# Patient Record
Sex: Female | Born: 1960
Health system: Southern US, Community
[De-identification: ages and names within clinical notes are randomized; demographics above are authoritative.]

## PROBLEM LIST (undated history)

## (undated) ENCOUNTER — Emergency Department (HOSPITAL_COMMUNITY): Admission: EM | Discharge status: Absent without leave | Payer: Medicare Other

## (undated) DIAGNOSIS — G4730 Sleep apnea, unspecified: Secondary | ICD-10-CM

## (undated) DIAGNOSIS — M1990 Unspecified osteoarthritis, unspecified site: Secondary | ICD-10-CM

## (undated) DIAGNOSIS — F419 Anxiety disorder, unspecified: Secondary | ICD-10-CM

## (undated) DIAGNOSIS — M549 Dorsalgia, unspecified: Secondary | ICD-10-CM

## (undated) DIAGNOSIS — I10 Essential (primary) hypertension: Secondary | ICD-10-CM

## (undated) DIAGNOSIS — F32A Depression, unspecified: Secondary | ICD-10-CM

## (undated) DIAGNOSIS — E785 Hyperlipidemia, unspecified: Secondary | ICD-10-CM

## (undated) DIAGNOSIS — Z955 Presence of coronary angioplasty implant and graft: Secondary | ICD-10-CM

## (undated) DIAGNOSIS — F329 Major depressive disorder, single episode, unspecified: Secondary | ICD-10-CM

## (undated) HISTORY — DX: Sleep apnea, unspecified: G47.30

## (undated) HISTORY — DX: Anxiety disorder, unspecified: F41.9

## (undated) HISTORY — DX: Depression, unspecified: F32.A

## (undated) HISTORY — DX: Hyperlipidemia, unspecified: E78.5

## (undated) HISTORY — DX: Unspecified osteoarthritis, unspecified site: M19.90

## (undated) HISTORY — PX: BACK SURGERY: SHX140

## (undated) HISTORY — DX: Essential (primary) hypertension: I10

## (undated) HISTORY — DX: Presence of coronary angioplasty implant and graft: Z95.5

## (undated) HISTORY — PX: KNEE SURGERY: SHX244

## (undated) HISTORY — PX: COLONOSCOPY: SHX174

## (undated) HISTORY — DX: Major depressive disorder, single episode, unspecified: F32.9

---

## 1985-08-06 HISTORY — PX: ECTOPIC PREGNANCY SURGERY: SHX613

## 1999-04-20 ENCOUNTER — Encounter: Payer: Self-pay | Admitting: Emergency Medicine

## 1999-04-20 ENCOUNTER — Emergency Department (HOSPITAL_COMMUNITY): Admission: EM | Admit: 1999-04-20 | Discharge: 1999-04-20 | Payer: Self-pay | Admitting: Emergency Medicine

## 1999-05-16 ENCOUNTER — Encounter: Payer: Self-pay | Admitting: Emergency Medicine

## 1999-05-16 ENCOUNTER — Emergency Department (HOSPITAL_COMMUNITY): Admission: EM | Admit: 1999-05-16 | Discharge: 1999-05-16 | Payer: Self-pay | Admitting: Emergency Medicine

## 1999-07-28 ENCOUNTER — Encounter: Payer: Self-pay | Admitting: Internal Medicine

## 1999-07-28 ENCOUNTER — Ambulatory Visit (HOSPITAL_COMMUNITY): Admission: RE | Admit: 1999-07-28 | Discharge: 1999-07-28 | Payer: Self-pay | Admitting: Internal Medicine

## 1999-08-11 ENCOUNTER — Encounter: Admission: RE | Admit: 1999-08-11 | Discharge: 1999-08-24 | Payer: Self-pay | Admitting: Internal Medicine

## 2000-04-14 ENCOUNTER — Emergency Department (HOSPITAL_COMMUNITY): Admission: EM | Admit: 2000-04-14 | Discharge: 2000-04-14 | Payer: Self-pay | Admitting: Emergency Medicine

## 2001-02-10 ENCOUNTER — Encounter: Payer: Self-pay | Admitting: Family Medicine

## 2001-02-10 ENCOUNTER — Ambulatory Visit (HOSPITAL_COMMUNITY): Admission: RE | Admit: 2001-02-10 | Discharge: 2001-02-10 | Payer: Self-pay | Admitting: Family Medicine

## 2001-11-19 ENCOUNTER — Emergency Department (HOSPITAL_COMMUNITY): Admission: EM | Admit: 2001-11-19 | Discharge: 2001-11-19 | Payer: Self-pay | Admitting: Emergency Medicine

## 2002-10-14 ENCOUNTER — Ambulatory Visit (HOSPITAL_COMMUNITY): Admission: RE | Admit: 2002-10-14 | Discharge: 2002-10-14 | Payer: Self-pay | Admitting: Family Medicine

## 2002-10-14 ENCOUNTER — Encounter: Payer: Self-pay | Admitting: Family Medicine

## 2003-01-30 ENCOUNTER — Emergency Department (HOSPITAL_COMMUNITY): Admission: EM | Admit: 2003-01-30 | Discharge: 2003-01-31 | Payer: Self-pay | Admitting: Emergency Medicine

## 2003-01-30 ENCOUNTER — Encounter: Payer: Self-pay | Admitting: *Deleted

## 2003-06-29 ENCOUNTER — Other Ambulatory Visit: Admission: RE | Admit: 2003-06-29 | Discharge: 2003-06-29 | Payer: Self-pay | Admitting: Obstetrics & Gynecology

## 2004-03-17 ENCOUNTER — Encounter: Admission: RE | Admit: 2004-03-17 | Discharge: 2004-06-15 | Payer: Self-pay | Admitting: Internal Medicine

## 2004-04-06 ENCOUNTER — Ambulatory Visit: Payer: Self-pay | Admitting: *Deleted

## 2004-04-24 ENCOUNTER — Ambulatory Visit: Payer: Self-pay | Admitting: *Deleted

## 2004-07-11 ENCOUNTER — Ambulatory Visit: Payer: Self-pay | Admitting: Internal Medicine

## 2004-07-25 ENCOUNTER — Ambulatory Visit: Payer: Self-pay | Admitting: Internal Medicine

## 2004-08-01 ENCOUNTER — Ambulatory Visit: Payer: Self-pay | Admitting: Internal Medicine

## 2004-08-23 ENCOUNTER — Ambulatory Visit: Payer: Self-pay | Admitting: Internal Medicine

## 2004-09-03 ENCOUNTER — Emergency Department (HOSPITAL_COMMUNITY): Admission: EM | Admit: 2004-09-03 | Discharge: 2004-09-03 | Payer: Self-pay | Admitting: Emergency Medicine

## 2004-09-27 ENCOUNTER — Ambulatory Visit: Payer: Self-pay | Admitting: Internal Medicine

## 2004-09-27 ENCOUNTER — Ambulatory Visit (HOSPITAL_COMMUNITY): Admission: RE | Admit: 2004-09-27 | Discharge: 2004-09-27 | Payer: Self-pay | Admitting: Internal Medicine

## 2004-09-29 ENCOUNTER — Ambulatory Visit: Payer: Self-pay | Admitting: Internal Medicine

## 2004-10-24 ENCOUNTER — Ambulatory Visit (HOSPITAL_COMMUNITY): Admission: RE | Admit: 2004-10-24 | Discharge: 2004-10-24 | Payer: Self-pay | Admitting: Neurosurgery

## 2004-10-25 ENCOUNTER — Ambulatory Visit: Payer: Self-pay | Admitting: Internal Medicine

## 2004-11-27 ENCOUNTER — Ambulatory Visit: Payer: Self-pay | Admitting: Internal Medicine

## 2004-12-12 ENCOUNTER — Ambulatory Visit (HOSPITAL_COMMUNITY): Admission: RE | Admit: 2004-12-12 | Discharge: 2004-12-13 | Payer: Self-pay | Admitting: Neurosurgery

## 2005-02-26 ENCOUNTER — Ambulatory Visit: Payer: Self-pay | Admitting: Internal Medicine

## 2005-03-15 ENCOUNTER — Encounter: Admission: RE | Admit: 2005-03-15 | Discharge: 2005-03-26 | Payer: Self-pay | Admitting: Neurosurgery

## 2005-03-16 ENCOUNTER — Ambulatory Visit (HOSPITAL_COMMUNITY): Admission: RE | Admit: 2005-03-16 | Discharge: 2005-03-16 | Payer: Self-pay | Admitting: Neurosurgery

## 2005-09-17 ENCOUNTER — Emergency Department (HOSPITAL_COMMUNITY): Admission: EM | Admit: 2005-09-17 | Discharge: 2005-09-17 | Payer: Self-pay | Admitting: Family Medicine

## 2005-11-12 ENCOUNTER — Emergency Department (HOSPITAL_COMMUNITY): Admission: EM | Admit: 2005-11-12 | Discharge: 2005-11-13 | Payer: Self-pay | Admitting: Emergency Medicine

## 2006-07-04 ENCOUNTER — Emergency Department (HOSPITAL_COMMUNITY): Admission: EM | Admit: 2006-07-04 | Discharge: 2006-07-05 | Payer: Self-pay | Admitting: Pediatrics

## 2006-09-30 ENCOUNTER — Ambulatory Visit (HOSPITAL_BASED_OUTPATIENT_CLINIC_OR_DEPARTMENT_OTHER): Admission: RE | Admit: 2006-09-30 | Discharge: 2006-09-30 | Payer: Self-pay | Admitting: Specialist

## 2007-01-21 ENCOUNTER — Emergency Department (HOSPITAL_COMMUNITY): Admission: EM | Admit: 2007-01-21 | Discharge: 2007-01-21 | Payer: Self-pay | Admitting: Emergency Medicine

## 2008-02-13 ENCOUNTER — Ambulatory Visit: Payer: Self-pay | Admitting: Internal Medicine

## 2008-02-13 LAB — CONVERTED CEMR LAB
ALT: 18 units/L (ref 0–35)
BUN: 9 mg/dL (ref 6–23)
Basophils Absolute: 0 10*3/uL (ref 0.0–0.1)
CO2: 25 meq/L (ref 19–32)
Creatinine, Ser: 0.86 mg/dL (ref 0.40–1.20)
Eosinophils Relative: 1 % (ref 0–5)
Ferritin: 42 ng/mL (ref 10–291)
Glucose, Bld: 91 mg/dL (ref 70–99)
HCT: 36.3 % (ref 36.0–46.0)
Hemoglobin: 11.2 g/dL — ABNORMAL LOW (ref 12.0–15.0)
Lymphocytes Relative: 41 % (ref 12–46)
Monocytes Absolute: 0.4 10*3/uL (ref 0.1–1.0)
Monocytes Relative: 7 % (ref 3–12)
RDW: 15.9 % — ABNORMAL HIGH (ref 11.5–15.5)
Saturation Ratios: 20 % (ref 20–55)
Total Bilirubin: 0.5 mg/dL (ref 0.3–1.2)

## 2008-02-22 ENCOUNTER — Emergency Department (HOSPITAL_COMMUNITY): Admission: EM | Admit: 2008-02-22 | Discharge: 2008-02-22 | Payer: Self-pay | Admitting: Emergency Medicine

## 2008-02-26 ENCOUNTER — Ambulatory Visit: Payer: Self-pay | Admitting: Internal Medicine

## 2008-03-04 ENCOUNTER — Ambulatory Visit: Payer: Self-pay | Admitting: Internal Medicine

## 2008-03-24 ENCOUNTER — Ambulatory Visit: Payer: Self-pay | Admitting: Internal Medicine

## 2008-03-25 ENCOUNTER — Emergency Department (HOSPITAL_COMMUNITY): Admission: EM | Admit: 2008-03-25 | Discharge: 2008-03-25 | Payer: Self-pay | Admitting: Emergency Medicine

## 2009-03-07 ENCOUNTER — Observation Stay (HOSPITAL_COMMUNITY): Admission: EM | Admit: 2009-03-07 | Discharge: 2009-03-08 | Payer: Self-pay | Admitting: Emergency Medicine

## 2009-03-07 ENCOUNTER — Ambulatory Visit: Payer: Self-pay | Admitting: Cardiovascular Disease

## 2009-03-07 ENCOUNTER — Encounter: Payer: Self-pay | Admitting: Cardiology

## 2009-03-08 ENCOUNTER — Encounter: Payer: Self-pay | Admitting: Cardiology

## 2009-03-14 ENCOUNTER — Encounter (INDEPENDENT_AMBULATORY_CARE_PROVIDER_SITE_OTHER): Payer: Self-pay | Admitting: *Deleted

## 2009-03-14 ENCOUNTER — Telehealth: Payer: Self-pay | Admitting: Cardiovascular Disease

## 2009-03-14 ENCOUNTER — Ambulatory Visit: Payer: Self-pay | Admitting: Cardiovascular Disease

## 2009-03-14 DIAGNOSIS — E876 Hypokalemia: Secondary | ICD-10-CM

## 2009-03-14 DIAGNOSIS — N289 Disorder of kidney and ureter, unspecified: Secondary | ICD-10-CM | POA: Insufficient documentation

## 2009-03-14 HISTORY — DX: Disorder of kidney and ureter, unspecified: N28.9

## 2009-03-23 LAB — CONVERTED CEMR LAB
CO2: 29 meq/L (ref 19–32)
Chloride: 110 meq/L (ref 96–112)
Potassium: 3.7 meq/L (ref 3.5–5.1)
Sodium: 145 meq/L (ref 135–145)

## 2009-04-20 ENCOUNTER — Emergency Department (HOSPITAL_COMMUNITY): Admission: EM | Admit: 2009-04-20 | Discharge: 2009-04-20 | Payer: Self-pay | Admitting: Family Medicine

## 2010-08-27 ENCOUNTER — Encounter: Payer: Self-pay | Admitting: Neurosurgery

## 2010-09-06 ENCOUNTER — Emergency Department (HOSPITAL_COMMUNITY)
Admission: EM | Admit: 2010-09-06 | Discharge: 2010-09-07 | Disposition: A | Discharge status: Home / Self Care | Payer: Self-pay | Attending: Emergency Medicine | Admitting: Emergency Medicine

## 2010-09-06 DIAGNOSIS — M25569 Pain in unspecified knee: Secondary | ICD-10-CM | POA: Insufficient documentation

## 2010-09-06 DIAGNOSIS — I10 Essential (primary) hypertension: Secondary | ICD-10-CM | POA: Insufficient documentation

## 2010-09-06 DIAGNOSIS — M5430 Sciatica, unspecified side: Secondary | ICD-10-CM | POA: Insufficient documentation

## 2010-09-06 DIAGNOSIS — Z9889 Other specified postprocedural states: Secondary | ICD-10-CM | POA: Insufficient documentation

## 2010-11-12 LAB — FOLATE: Folate: 20 ng/mL

## 2010-11-12 LAB — LIPID PANEL
Cholesterol: 164 mg/dL (ref 0–200)
HDL: 72 mg/dL (ref >39)
Total CHOL/HDL Ratio: 2.3 RATIO

## 2010-11-12 LAB — BASIC METABOLIC PANEL
CO2: 26 mEq/L (ref 19–32)
Chloride: 107 mEq/L (ref 96–112)
Glucose, Bld: 97 mg/dL (ref 70–99)
Potassium: 3.8 mEq/L (ref 3.5–5.1)
Sodium: 137 mEq/L (ref 135–145)

## 2010-11-12 LAB — CARDIAC PANEL(CRET KIN+CKTOT+MB+TROPI)
CK, MB: 2.5 ng/mL (ref 0.3–4.0)
Relative Index: 1.2 (ref 0.0–2.5)
Relative Index: 1.2 (ref 0.0–2.5)
Total CK: 205 U/L — ABNORMAL HIGH (ref 7–177)
Troponin I: 0.01 ng/mL (ref 0.00–0.06)

## 2010-11-12 LAB — DIFFERENTIAL
Basophils Absolute: 0 10*3/uL (ref 0.0–0.1)
Basophils Relative: 0 % (ref 0–1)
Eosinophils Relative: 1 % (ref 0–5)
Monocytes Absolute: 0.3 10*3/uL (ref 0.1–1.0)
Monocytes Relative: 6 % (ref 3–12)

## 2010-11-12 LAB — IRON AND TIBC
Saturation Ratios: 16 % — ABNORMAL LOW (ref 20–55)
TIBC: 276 ug/dL (ref 250–470)
UIBC: 231 ug/dL

## 2010-11-12 LAB — POCT CARDIAC MARKERS: CKMB, poc: 2.1 ng/mL (ref 1.0–8.0)

## 2010-11-12 LAB — CBC
HCT: 31.9 % — ABNORMAL LOW (ref 36.0–46.0)
Hemoglobin: 10.6 g/dL — ABNORMAL LOW (ref 12.0–15.0)
MCHC: 33.3 g/dL (ref 30.0–36.0)
RBC: 3.72 MIL/uL — ABNORMAL LOW (ref 3.87–5.11)
RDW: 15.4 % (ref 11.5–15.5)

## 2010-11-12 LAB — VITAMIN B12: Vitamin B-12: 557 pg/mL (ref 211–911)

## 2010-11-12 LAB — FERRITIN: Ferritin: 37 ng/mL (ref 10–291)

## 2010-11-12 LAB — CK TOTAL AND CKMB (NOT AT ARMC)
CK, MB: 5 ng/mL — ABNORMAL HIGH (ref 0.3–4.0)
Relative Index: 1.6 (ref 0.0–2.5)
Total CK: 311 U/L — ABNORMAL HIGH (ref 7–177)

## 2010-12-19 NOTE — H&P (Signed)
NAMENECHA, Cathy [name]              ACCOUNT NO.:  [id number]   MEDICAL RECORD NO.:  [id number]          PATIENT TYPE:  INP   LOCATION:  3705                         FACILITY:  [facility]   PHYSICIAN:  Madolyn Frieze. Jens Som, MD, FACCDATE OF BIRTH:  25-Feb-1961   DATE OF ADMISSION:  03/07/2009  DATE OF DISCHARGE:                              HISTORY & PHYSICAL   PRIMARY CARE PHYSICIAN:  At [facility].   PRIMARY CARDIOLOGIST:  None.   CHIEF COMPLAINT:  Chest pain.   HISTORY OF PRESENT ILLNESS:  Cathy [name] is a 50 year old female with  no history of coronary artery disease.  She was wakened by substernal  chest pain and left-sided chest pain at 5 a.m.  She states the pain was  a tightness and reached to 10/10.  It was worse with movement and deep  inspiration.  It radiated to her left shoulder.  She had some shortness  of breath with this but no nausea, vomiting or diaphoresis.  She had  some slight dizziness.  When her symptoms did not resolve, she came to  the ER at approximately 6:30.  In the emergency room, she received  aspirin and sublingual nitroglycerin.  The tightness resolved but she  continues to have sharp chest pain intermittently.  Currently, she is  resting comfortably.  Of note, she has had a few episodes of chest  tightness prior to the one that woke her today but they were less  severe.  She has no exertional symptoms and feels that her exertion  level is at least moderate in the course of her job.   PAST MEDICAL HISTORY:  1. Hypertension.  2. Family history of premature coronary artery disease.  3. Obesity.  4. History of anemia by labs but the patient is unaware.   SURGICAL HISTORY:  She is status post back surgery and knee surgery.   ALLERGIES:  No known drug allergies.   MEDICATIONS:  None.  OTC, rare.   Previous medications included hydrochlorothiazide and one other drug for  blood pressure control but she does discontinued these because of hair  loss.   SOCIAL HISTORY:  She lives in [place] alone and works at  [employer].  She quit smoking in her early 51s and denies alcohol or  drug abuse.   FAMILY HISTORY:  Her mother died at age 49 of an MI.  Her father died at  age 48 of a CVA but was already in a nursing home.  She has no coronary  artery disease known in her siblings.   REVIEW OF SYSTEMS:  Significant for occasional arthralgias.  She denies  any significant dyspnea on exertion but states she does have to stop and  rest occasionally.  She does not feel that this has changed dramatically  in the last 6 months to several years.  She denies fevers or chills.  She denies melena.  Of note, she states that she has not had a menstrual  period since 2003.  She denies reflux symptoms or abdominal pain.  Full  14-point review of systems is otherwise negative.   PHYSICAL EXAMINATION:  VITAL SIGNS:  Temperature is 98.0, blood pressure  151/81, pulse 54, respiratory rate 18, O2 saturation 100% on room air.  GENERAL:  She is a well-developed obese African American female in no  acute distress.  HEENT:  Normal.  NECK:  There is no lymphadenopathy, thyromegaly, bruit or JVD noted.  CVA:  Her heart is regular in rate and rhythm with an S1-S2 and no  significant murmur, rub or gallop is noted.  Distal pulses are intact in  all four extremities.  LUNGS:  Clear to auscultation bilaterally.  SKIN:  No rashes or lesions are noted.  ABDOMEN:  Soft and nontender with active bowel sounds.  EXTREMITIES:  There is no cyanosis, clubbing or edema noted.  MUSCULOSKELETAL:  There is no joint deformity or effusions and no spine  or CVA tenderness.  NEURO:  She is alert and oriented.  Cranial nerves II-XII grossly  intact.   Chest x-ray shows borderline cardiomegaly with increased bronchial  markings but no acute disease.   EKG sinus bradycardia rate 48 but no acute ischemic changes.   LABORATORY VALUES:  Hemoglobin 10.6, hematocrit 31.9, WBCs  4.5,  platelets 194.  Sodium 137, potassium 3.8, chloride 107, CO2 26, BUN 13,  creatinine 0.78, glucose 97.  Point of care markers show an MB of 2.1,  troponin 0.18, myoglobin 82.3, a D-dimer is normal.   IMPRESSION:  Cathy [name] was seen today by Dr. Jens Som.  She is a 50-  year-old female with a past medical history of hypertension who we were  asked to evaluate for chest pain.  She developed chest pain at  approximately 5 a.m. which woke her.  It is mostly on the left side and  increased with movements and moving her left upper extremity as well as  increased inspiration.  There were no associated symptoms and the  duration was about 3 hours total.  It was relieved with sublingual  nitroglycerin.  She has no history of exertional chest pain.  Her EKG  has no ST changes and is sinus bradycardia.  Her chest pain was  reproducible and is questionably musculoskeletal but the point of care  troponin is increased.  She will be admitted and we will cycle cardiac  enzymes.  If these are negative, a Myoview will be scheduled in a.m.  She needs an outpatient evaluation for normocytic anemia, but her  hemoglobin was 10.3 with a hematocrit of 31.9 in May 2006 prior to back  surgery.  Because of longstanding anemia, although her MCV is normal, we  will check an iron profile.      Cathy Demark, PA-C      Madolyn Frieze. Jens Som, MD, [facility]  Electronically Signed    RB/MEDQ  D:  03/07/2009  T:  03/07/2009  Job:  [id number]

## 2010-12-19 NOTE — Discharge Summary (Signed)
[name], [name]              ACCOUNT NO.:  [id number]   MEDICAL RECORD NO.:  [id number]          PATIENT TYPE:  OBV   LOCATION:  3705                         FACILITY:  [facility]   PHYSICIAN:  Cathy Pick. Eden Emms, MD, FACCDATE OF BIRTH:  [birth year]   DATE OF ADMISSION:  03/07/2009  DATE OF DISCHARGE:  03/08/2009                               DISCHARGE SUMMARY   PROCEDURES:  1. 2-D echocardiogram.  2. Adenosine Myoview.   PRIMARY FINAL DISCHARGE DIAGNOSIS:  Chest pain, cardiac enzymes negative  for myocardial infarction and Myoview negative for ischemia with a  preserved ejection fraction.   SECONDARY DIAGNOSES:  1. Hypertension.  2. Family history of premature coronary artery disease.  3. Obesity.  4. Anemia.  5. Status post back surgery and knee surgery.   TIME OF DISCHARGE:  34 minutes.   HOSPITAL COURSE:  Cathy [name] is a 50 year old female with known  previous history of coronary artery disease.  She had chest tightness  and came to the hospital where she was admitted for further evaluation.   Her cardiac enzymes were negative for MI.  She was off her blood  pressure medications because of hair loss, so different medications were  started.  She tolerated these well and her blood pressure decreased.  She was anemic with a hemoglobin of 10.6 and hematocrit of 31.9.  These  labs were similar to preop labs for back surgery she had remotely, but  the patient was unaware of this.  An iron profile was performed which  showed low normal iron at 45, TIBC 276, and 16% saturation.  Serum  folate and ferritin were within normal limits.  She is advised to follow  up with her primary care physician.   A lipid profile showed total cholesterol 164, triglycerides 48, HDL 72,  and LDL 82.  Cardiac enzymes were negative for MI.  An adenosine Myoview  was performed in the hospital which showed no scar or ischemia and an EF  of 58%.  A 2-D echocardiogram was ordered and preliminary results  say  that it was normal, but detailed results are not available at this time.   On March 08, 2009, Dr. Eden [name] evaluated the data and Cathy [name].  Her  chest pain had resolved.  He felt that she was stable for discharge with  p.r.n. outpatient followup.   DISCHARGE INSTRUCTIONS:  Her activity level is to be increased  gradually.  She is encouraged to stick to a low-sodium, heart-healthy  diet.  She was started on an ACE inhibitor and therefore needs a BMET in  a week.  She is to follow up with [facility] and call for an  appointment.   DISCHARGE MEDICATIONS:  Per the computerized discharge sheet.      Cathy Demark, PA-C      Cathy Pick. Eden Emms, MD, [facility]  Electronically Signed    RB/MEDQ  D:  03/08/2009  T:  03/09/2009  Job:  [id number]   cc:   Cathy [name]

## 2010-12-22 NOTE — Op Note (Signed)
[name], Cathy [name]              ACCOUNT NO.:  [id number]   MEDICAL RECORD NO.:  [id number]          PATIENT TYPE:  AMB   LOCATION:  NESC                         FACILITY:  [facility]   PHYSICIAN:  Jene Every, M.D.    DATE OF BIRTH:  [birth year]   DATE OF PROCEDURE:  09/30/2006  DATE OF DISCHARGE:                               OPERATIVE REPORT   PREOPERATIVE DIAGNOSES:  Posttraumatic chondromalacia of the  patellofemoral joint.   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   PROCEDURE:  Left knee arthroscopy, chondroplasty of the patella and of  the tibia plateau, shaving of the lateral meniscus.   ANESTHESIA:  General.   ASSISTANT:  None.   BRIEF HISTORY AND INDICATIONS:  A 50 year old sustained an injury to the  left knee. MRI indicating chondromalacia refractory to conservative  treatment with persistent patellofemoral pain despite injections. She  was indicated for arthroscopic debridement and chondroplasty. The risks  and benefits were discussed including bleeding, infection, no change in  symptoms, worsening symptoms, need for repeat debridements in the  future, anesthetic complications, etc.   TECHNIQUE:  The patient in supine position and after the induction of  adequate general anesthesia and 2 grams of Kefzol, the left lower  extremity was  prepped and draped in the usual sterile fashion. A  lateral parapatellar portal and superomedial parapatellar portal was  fashioned with a #11 blade, Ingress cannula atraumatically placed.  Irrigant was utilized to insufflate the joint. Under direct  visualization, a medial parapatellar portal was fashioned with a #11  blade after localization with an 18-gauge needle sparing the medial  meniscus. Noted immediately were some significant grade 3 changes of the  entire patella. There was normal patellofemoral tracking. I introduced a  4.2 Kuda shaver and utilized it to perform a chondroplasty of the entire  patella through the medial and lateral parapatellar portal. The lateral  compartment revealed grade 3 changes of the tibial plateau and a  chondroplasty was performed. There was some degenerative fraying of the  meniscus, this was shaved as well. The remainder of the meniscus however  was stable to propalpation. The femoral condyle was unremarkable. ACL  and PCL were unremarkable. The medial compartment was essentially  unremarkable. Normal meniscus. No evidence of tear. The chondral  surfaces showed some minor grade 2 changes. The gutters were  unremarkable. The knee was copiously lavaged, I reexamined the patella  again with the repeat chondroplasty. There was no loose cartilaginous  debris or any other chondral surface amendable to arthroscopy. Next, the  knee was copiously lavaged, all instrumentation was removed, portals  were closed with 4-0 nylon simple sutures. A 0.25% Marcaine with  epinephrine was infiltrated in the joint, the wound  was dressed sterilely, she was awoken without difficulty and transported  to the recovery room in satisfactory condition.   The patient tolerated the procedure well with no complications.      Jene Every, M.D.  Electronically Signed     JB/MEDQ  D:  09/30/2006  T:  09/30/2006  Job:  [id number]

## 2010-12-22 NOTE — Op Note (Signed)
Cathy [name], Cathy [name] NO.:  [id number]   MEDICAL RECORD NO.:  [id number]          PATIENT TYPE:  INP   LOCATION:  3172                         FACILITY:  [facility]   PHYSICIAN:  Clydene Fake, M.D.  DATE OF BIRTH:  1961/06/04   DATE OF PROCEDURE:  12/12/2004  DATE OF DISCHARGE:                                 OPERATIVE REPORT   PREOPERATIVE DIAGNOSIS:  Herniated nucleus pulposus, right L5-S1.   POSTOPERATIVE DIAGNOSIS:  Prior surgery with recurrent herniated nucleus  pulposus, right L5-S1.   PROCEDURE:  Redo right L5-S1 laminectomy and discectomy, microdissection  with the microscope.   SURGEON:  Clydene Fake, M.D.   ASSISTANT:  Payton Doughty, M.D.   ANESTHESIA:  General endotracheal anesthesia.   ESTIMATED BLOOD LOSS:  Minimal.   DRAINS:  None.   COMPLICATIONS:  None.   INDICATIONS FOR PROCEDURE:  The patient is a 50 year old woman who has had  back and right leg pain and numbness.  She did have a lumbar laminectomy in  1999 for right sided problems definitely at the 4-5 level, possibly at the 5-  1 level.  The patient has had a right L5 radiculopathy with weakness and  numbness ever since, but newer symptoms have been an S1 type radiculopathy  with pain and numbness.  MRI was done showing disc herniation right side L5-  S1 compressing the S1 root.  The patient is brought in for discectomy.   PROCEDURE IN DETAIL:  The patient was brought to the operating room, general  anesthesia was induced, the patient was placed in the prone position on the  Wilson frame with all pressure points padded.  The patient was prepped and  draped in the usual sterile fashion.  The site of incision was injected with  10 mL of 1% lidocaine with epinephrine.  An incision was made in the midline  lower lumbar spine at the same site of the prior incision.  This was taken  down to the fascia.  Hemostasis was obtained with Bovie cauterization.  The  fascia was incised over the  spinous processes of L5-S1 and subperiosteal  dissection was done up to the facets.  A marker was placed in the interspace  and x-ray was obtained showing this was the L5-S1 interspace.  A self-  retaining retractor was placed.  Microscope was brought in for  microdissection.  We were dissecting through scar through the entire opening  of a subperiosteal dissection.  The scar continued down medial to the facet,  we carefully dissected the scar off the lamina and facets and the S1 lamina.  A foraminotomy over the S1 root removed medial facet and removed a little  more of the L5 lamina using a high speed drill and then Kerrison punches.  We carefully dissected the scar and then removed some hypertrophic ligament  giving [place] some more room down to the pedicle of S1, dissecting up under the  S1 nerve root to the disc space.  I dissected to the scar over the disc  space and to the L5 vertebral body and then up to  the foramen.  During this  dissection we did loosen up the scar and the hypertrophic ligament which we  removed.  We then opened the disc space at 5-1 and incised the space with a  15 blade and did discectomy with pituitary rongeurs and curets.  We  continued to loosen the dura from the scar from the disc space as we  retracted the dura and nerve root medially and opened up the disc space  more.  There was some scar tissue over the dura and with careful dissection,  found similar fragments of disc that were pushed up into the dura.  I  continued with the discectomy and when we were finished, we had good  discectomy, good central decompression, and the L5 and S1 roots were  decompressed at this level.  The wound was irrigated with antibiotic  solution.  We had good hemostasis.  Again, the wound was irrigated with  antibiotic solution, the retractors were removed.  The fascia was closed  with 0 Vicryl interrupted sutures, the subcutaneous tissues were closed with  0, 2-0 and 3-0 Vicryl  sutures, and the skin was closed with Benzoin and  Steri-Strips.  Note, during all the dissection, discectomy, and dissection  of the scar, the microscope was used with microdissection technique.  A  dressing was placed.  She was placed back in the supine position, awakened  from anesthesia, and transferred to the recovery room in stable condition.      JRH/MEDQ  D:  12/12/2004  T:  12/12/2004  Job:  [id number]

## 2013-01-12 ENCOUNTER — Other Ambulatory Visit (HOSPITAL_COMMUNITY): Payer: Self-pay | Admitting: Family Medicine

## 2013-01-12 ENCOUNTER — Ambulatory Visit (HOSPITAL_COMMUNITY)
Admission: RE | Admit: 2013-01-12 | Discharge: 2013-01-12 | Disposition: A | Discharge status: Home / Self Care | Payer: Self-pay | Source: Ambulatory Visit | Attending: Family Medicine | Admitting: Family Medicine

## 2013-01-12 DIAGNOSIS — M545 Low back pain, unspecified: Secondary | ICD-10-CM | POA: Insufficient documentation

## 2013-01-12 DIAGNOSIS — R52 Pain, unspecified: Secondary | ICD-10-CM

## 2013-01-22 ENCOUNTER — Encounter (HOSPITAL_COMMUNITY): Payer: Self-pay | Admitting: *Deleted

## 2013-01-22 DIAGNOSIS — L299 Pruritus, unspecified: Secondary | ICD-10-CM | POA: Insufficient documentation

## 2013-01-22 DIAGNOSIS — L509 Urticaria, unspecified: Secondary | ICD-10-CM | POA: Insufficient documentation

## 2013-01-22 NOTE — ED Notes (Signed)
Pt states that she got a watermelon today outside off the street from a vendor for the first time at this vendor. Pt states that she ate the watermelon this morning and did not have a rash, then she ate a bigger piece a couple of hours ago and started having hives on her forearms an hour ago. Pt has had watermelon before but doesn't know if she had a reaction to this watermelon.

## 2013-01-23 ENCOUNTER — Emergency Department (HOSPITAL_COMMUNITY)
Admission: EM | Admit: 2013-01-23 | Discharge: 2013-01-23 | Disposition: A | Discharge status: Home / Self Care | Payer: Self-pay | Attending: Emergency Medicine | Admitting: Emergency Medicine

## 2013-01-23 DIAGNOSIS — L509 Urticaria, unspecified: Secondary | ICD-10-CM

## 2013-01-23 HISTORY — DX: Dorsalgia, unspecified: M54.9

## 2013-01-23 MED ORDER — FAMOTIDINE 20 MG PO TABS
20.0000 mg | ORAL_TABLET | Freq: Once | ORAL | Status: AC
  Administered 2013-01-23: 20 mg via ORAL
  Filled 2013-01-23: qty 1

## 2013-01-23 MED ORDER — DIPHENHYDRAMINE HCL 12.5 MG/5ML PO ELIX
50.0000 mg | ORAL_SOLUTION | Freq: Once | ORAL | Status: AC
  Administered 2013-01-23: 50 mg via ORAL
  Filled 2013-01-23: qty 20

## 2013-01-23 NOTE — ED Provider Notes (Signed)
Medical screening examination/treatment/procedure(s) were performed by non-physician practitioner and as supervising physician I was immediately available for consultation/collaboration.   Joya Gaskins, MD 01/23/13 [phone]

## 2013-01-23 NOTE — ED Provider Notes (Signed)
History     CSN: [id number]  Arrival date & time 01/22/13  2342   None     Chief Complaint  Patient presents with  . Rash    (Consider location/radiation/quality/duration/timing/severity/associated sxs/prior treatment) HPI History provided by pt.   Pt c/o severely pruritic and diffuse rash of upper extremities since last night.  Has not taken anything for sx. No associated sx.  No known allergies or new contacts.  She suspects that she is having a reaction to watermelon.  Past Medical History  Diagnosis Date  . Back pain     Past Surgical History  Procedure Laterality Date  . Back surgery    . Knee surgery      History reviewed. No pertinent family history.  History  Substance Use Topics  . Smoking status: Never Smoker   . Smokeless tobacco: Not on file  . Alcohol Use: Yes    OB History   Grav Para Term Preterm Abortions TAB SAB Ect Mult Living                  Review of Systems  All other systems reviewed and are negative.    Allergies  Review of patient's allergies indicates no known allergies.  Home Medications   Current Outpatient Rx  Name  Route  Sig  Dispense  Refill  . cyclobenzaprine (FLEXERIL) 10 MG tablet   Oral   Take 10 mg by mouth 3 (three) times daily as needed for muscle spasms.         . diphenhydrAMINE (BENADRYL) 2 % cream   Topical   Apply topically 3 (three) times daily as needed for itching.         . diphenhydrAMINE (SOMINEX) 25 MG tablet   Oral   Take 25 mg by mouth at bedtime as needed for itching or sleep.         . nabumetone (RELAFEN) 500 MG tablet   Oral   Take 500 mg by mouth 2 (two) times daily as needed for pain.         . traMADol (ULTRAM) 50 MG tablet   Oral   Take 50 mg by mouth every 6 (six) hours as needed for pain.           BP 108/69  Pulse 54  Temp(Src) 98.2 F (36.8 C) (Oral)  Resp 16  Ht 5\' 2"  (1.575 m)  Wt 236 lb (107.049 kg)  BMI 43.15 kg/m2  SpO2 99%  Physical Exam  Nursing  note and vitals reviewed. Constitutional: She is oriented to person, place, and time. She appears well-developed and well-nourished. No distress.  HENT:  Head: Normocephalic and atraumatic.  Mouth/Throat: Oropharynx is clear and moist and mucous membranes are normal. No posterior oropharyngeal edema.  No lip or tongue edema.  Eyes:  Normal appearance  Neck: Normal range of motion.  Cardiovascular: Normal rate and regular rhythm.   Pulmonary/Chest: Effort normal and breath sounds normal. No stridor. No respiratory distress.  Musculoskeletal: Normal range of motion.  Neurological: She is alert and oriented to person, place, and time.  Skin: Skin is warm and dry. No rash noted.  Diffuse hives and excoriations of upper extremities.   Psychiatric: She has a normal mood and affect. Her behavior is normal.    ED Course  Procedures (including critical care time)  Labs Reviewed - No data to display No results found.   1. Hives       MDM  [phone] F presents w/  hives.  No sign of impending airway compromise.  Pt had significant relief w/ po benadryl and pepcid.  Recommended that she continue these medications.  Return precautions discussed.         Otilio Miu, PA-C 01/23/13 [phone]

## 2013-04-06 ENCOUNTER — Emergency Department (HOSPITAL_COMMUNITY)
Admission: EM | Admit: 2013-04-06 | Discharge: 2013-04-06 | Disposition: A | Discharge status: Home / Self Care | Payer: Self-pay | Attending: Emergency Medicine | Admitting: Emergency Medicine

## 2013-04-06 ENCOUNTER — Emergency Department (HOSPITAL_COMMUNITY): Payer: Self-pay

## 2013-04-06 ENCOUNTER — Encounter (HOSPITAL_COMMUNITY): Payer: Self-pay | Admitting: Emergency Medicine

## 2013-04-06 DIAGNOSIS — M7662 Achilles tendinitis, left leg: Secondary | ICD-10-CM

## 2013-04-06 DIAGNOSIS — Z8739 Personal history of other diseases of the musculoskeletal system and connective tissue: Secondary | ICD-10-CM | POA: Insufficient documentation

## 2013-04-06 DIAGNOSIS — M7660 Achilles tendinitis, unspecified leg: Secondary | ICD-10-CM | POA: Insufficient documentation

## 2013-04-06 DIAGNOSIS — Z87891 Personal history of nicotine dependence: Secondary | ICD-10-CM | POA: Insufficient documentation

## 2013-04-06 MED ORDER — NAPROXEN 500 MG PO TABS: 500.0000 mg | ORAL_TABLET | Freq: Two times a day (BID) | ORAL | Status: DC

## 2013-04-06 NOTE — ED Provider Notes (Signed)
CSN: [id number]     Arrival date & time 04/06/13  1034 History   First MD Initiated Contact with Patient 04/06/13 1047     Chief Complaint  Patient presents with  . Ankle Pain   (Consider location/radiation/quality/duration/timing/severity/associated sxs/prior Treatment) The history is provided by the patient. No language interpreter was used.  Cathy [name] is a 52 year old female with past medical history of chronic back pain presenting to the emergency department with left ankle pain that started approximately 3 days ago. Patient reported that the discomfort is very to the posterior aspect of her left ankle, described as a throbbing sensation with a shooting pain when she takes off with walking. Patient reported that the pain radiates up the posterior aspect of the left leg when walking. Patient reported that she has been propping the left ankle up been taking over-the-counter medications with -6 facet and relief of pain. Patient reported that she has not had any type of injuries or falls. Denied numbness, tingling, calf pain, swelling, changes to colors, loss of sensation. PCP Dr. Venetia Night  Past Medical History  Diagnosis Date  . Back pain    Past Surgical History  Procedure Laterality Date  . Back surgery    . Knee surgery     No family history on file. History  Substance Use Topics  . Smoking status: Former Games developer  . Smokeless tobacco: Not on file  . Alcohol Use: Yes   OB History   Grav Para Term Preterm Abortions TAB SAB Ect Mult Living                 Review of Systems  Respiratory: Negative for shortness of breath.   Cardiovascular: Negative for chest pain.  Musculoskeletal: Positive for arthralgias.  Neurological: Negative for weakness and numbness.  All other systems reviewed and are negative.    Allergies  Review of patient's allergies indicates no known allergies.  Home Medications   Current Outpatient Rx  Name  Route  Sig  Dispense  Refill  . cyclobenzaprine  (FLEXERIL) 10 MG tablet   Oral   Take 10 mg by mouth 2 (two) times daily as needed for muscle spasms.          . methocarbamol (ROBAXIN) 500 MG tablet   Oral   Take 500 mg by mouth 4 (four) times daily.         . traMADol (ULTRAM) 50 MG tablet   Oral   Take 50 mg by mouth every 6 (six) hours as needed for pain.         . nabumetone (RELAFEN) 500 MG tablet   Oral   Take 250-500 mg by mouth 2 (two) times daily as needed for pain.          . naproxen (NAPROSYN) 500 MG tablet   Oral   Take 1 tablet (500 mg total) by mouth 2 (two) times daily.   30 tablet   0    BP 138/72  Pulse 82  Temp(Src) 98.4 F (36.9 C) (Oral)  Resp 18  Ht 5\' 5"  (1.651 m)  Wt 250 lb (113.399 kg)  BMI 41.6 kg/m2  SpO2 99% Physical Exam  Nursing note and vitals reviewed. Constitutional: She is oriented to person, place, and time. She appears well-developed and well-nourished. No distress.  HENT:  Head: Normocephalic and atraumatic.  Cardiovascular: Normal rate, regular rhythm and normal heart sounds.  Exam reveals no friction rub.   No murmur heard. Pulses:      Radial  pulses are 2+ on the right side, and 2+ on the left side.       Dorsalis pedis pulses are 2+ on the right side, and 2+ on the left side.  Negative ankle and leg swelling Negative pitting edema Negative calf pain  Pulmonary/Chest: Effort normal and breath sounds normal. No respiratory distress. She has no wheezes. She has no rales.  Musculoskeletal: Normal range of motion. She exhibits tenderness.       Legs: Negative swelling, erythema, inflammation, ecchymosis, deformities noted to the left foot and ankle. Full range of motion to the left ankle-see flexion, plantar flexion, inversion, eversion. Patient able to flex and extend digits without discomfort noted to the left foot. Patient able to apply weight to the left foot without discomfort noted. Discomfort upon palpation to the posterior aspect of the left ankle. Negative  Thompson sign.  Neurological: She is alert and oriented to person, place, and time. She exhibits normal muscle tone. Coordination normal.  Strength 5+/5+ with resistance to bilateral lower extremities, equal distribution Sensation intact to lower extremities bilaterally with differentiation to sharp and dull touch Mild limp noted when applying pressure to the left foot, discomfort noted when take off of the left foot when walking  Skin: Skin is warm and dry. No rash noted. She is not diaphoretic. No erythema.  Psychiatric: She has a normal mood and affect. Her behavior is normal. Thought content normal.    ED Course  Procedures (including critical care time) Labs Review Labs Reviewed - No data to display Imaging Review Dg Ankle Complete Left  04/06/2013   *RADIOLOGY REPORT*  Clinical Data: Left ankle pain.  LEFT ANKLE COMPLETE - 3+ VIEW  Comparison: None.  Findings: No acute bony or joint abnormality is identified.  No tibiotalar joint effusion.  Small calcaneal spur is noted.  IMPRESSION:  1.  No acute finding. 2.  Small calcaneal spur.   Original Report Authenticated By: Holley Dexter, M.D.    MDM   1. Achilles tendinitis, left     Patient presenting to the emergency department with posterior aspect left ankle discomfort that started approximately 3 days ago. Denied injury, fall. Alert and oriented. Negative deformities or erythema, inflammation noted to the left ankle. Full range of motion to the left ankle and digits of left foot. Discomfort noted upon palpation to the posterior aspect of the left ankle, Achilles tendon. Sensation intact, strength intact. Negative neurological deficits noted. Pulses palpable. Imaging of left ankle negative findings noted, small calcaneal spur identified. Suspicion to be Achilles tendinitis in nature. Patient reported that she wears slippers most the time and does not have proper comfort in her shoes. Patient placed in left ankle brace for comfort.  Discharge patient with small dose of anti-inflammatories. Discussed with patient proper shoe wear for comfort. Referred patient to orthopedics for followup. Discussed with patient to continue to monitor symptoms and if symptoms are to worsen or change to report back to emergency department-to return structures given. Patient be to plan of care, understood, all questions answered.    Raymon Mutton, PA-C 04/08/13 [phone]

## 2013-04-06 NOTE — ED Notes (Signed)
Patient complains of L ankle pain.  Patient states she has not had any injury that she is aware of.   Patient states no real swelling.   Patient advised has not been taking any pain medication for her ankle at home, however, took medicine for "chronic back problems" while she was in triage.

## 2013-04-08 NOTE — ED Provider Notes (Signed)
Medical screening examination/treatment/procedure(s) were performed by non-physician practitioner and as supervising physician I was immediately available for consultation/collaboration.   Enid Skeens, MD 04/08/13 [phone]

## 2013-09-14 ENCOUNTER — Encounter (HOSPITAL_COMMUNITY): Payer: Self-pay | Admitting: Emergency Medicine

## 2013-09-14 DIAGNOSIS — N644 Mastodynia: Secondary | ICD-10-CM | POA: Insufficient documentation

## 2013-09-14 DIAGNOSIS — M549 Dorsalgia, unspecified: Secondary | ICD-10-CM | POA: Insufficient documentation

## 2013-09-14 DIAGNOSIS — N6459 Other signs and symptoms in breast: Secondary | ICD-10-CM | POA: Insufficient documentation

## 2013-09-14 DIAGNOSIS — Z87891 Personal history of nicotine dependence: Secondary | ICD-10-CM | POA: Insufficient documentation

## 2013-09-14 DIAGNOSIS — Z79899 Other long term (current) drug therapy: Secondary | ICD-10-CM | POA: Insufficient documentation

## 2013-09-14 NOTE — ED Notes (Signed)
Pt states she has been having some clear discharge from her left breast x 2 weeks.

## 2013-09-15 ENCOUNTER — Emergency Department (HOSPITAL_COMMUNITY)
Admission: EM | Admit: 2013-09-15 | Discharge: 2013-09-15 | Disposition: A | Discharge status: Home / Self Care | Payer: Self-pay | Attending: Emergency Medicine | Admitting: Emergency Medicine

## 2013-09-15 DIAGNOSIS — N6452 Nipple discharge: Secondary | ICD-10-CM

## 2013-09-15 NOTE — Discharge Instructions (Signed)
Recommend followup with the breast clinic as an outpatient; call for appointment. Also recommend followup with women's outpatient clinic.  Breast Self-Awareness Practicing breast self-awareness may pick up problems early, prevent significant medical complications, and possibly save your life. By practicing breast self-awareness, you can become familiar with how your breasts look and feel and if your breasts are changing. This allows you to notice changes early. It can also offer you some reassurance that your breast health is good. One way to learn what is normal for your breasts and whether your breasts are changing is to do a breast self-exam. If you find a lump or something that was not present in the past, it is best to contact your caregiver right away. Other findings that should be evaluated by your caregiver include nipple discharge, especially if it is bloody; skin changes or reddening; areas where the skin seems to be pulled in (retracted); or new lumps and bumps. Breast pain is seldom associated with cancer (malignancy), but should also be evaluated by a caregiver. HOW TO PERFORM A BREAST SELF-EXAM The best time to examine your breasts is 5 7 days after your menstrual period is over. During menstruation, the breasts are lumpier, and it may be more difficult to pick up changes. If you do not menstruate, have reached menopause, or had your uterus removed (hysterectomy), you should examine your breasts at regular intervals, such as monthly. If you are breastfeeding, examine your breasts after a feeding or after using a breast pump. Breast implants do not decrease the risk for lumps or tumors, so continue to perform breast self-exams as recommended. Talk to your caregiver about how to determine the difference between the implant and breast tissue. Also, talk about the amount of pressure you should use during the exam. Over time, you will become more familiar with the variations of your breasts and more  comfortable with the exam. A breast self-exam requires you to remove all your clothes above the waist. 1. Look at your breasts and nipples. Stand in front of a mirror in a room with good lighting. With your hands on your hips, push your hands firmly downward. Look for a difference in shape, contour, and size from one breast to the other (asymmetry). Asymmetry includes puckers, dips, or bumps. Also, look for skin changes, such as reddened or scaly areas on the breasts. Look for nipple changes, such as discharge, dimpling, repositioning, or redness. 2. Carefully feel your breasts. This is best done either in the shower or tub while using soapy water or when flat on your back. Place the arm (on the side of the breast you are examining) above your head. Use the pads (not the fingertips) of your three middle fingers on your opposite hand to feel your breasts. Start in the underarm area and use  inch (2 cm) overlapping circles to feel your breast. Use 3 different levels of pressure (light, medium, and firm pressure) at each circle before moving to the next circle. The light pressure is needed to feel the tissue closest to the skin. The medium pressure will help to feel breast tissue a little deeper, while the firm pressure is needed to feel the tissue close to the ribs. Continue the overlapping circles, moving downward over the breast until you feel your ribs below your breast. Then, move one finger-width towards the center of the body. Continue to use the  inch (2 cm) overlapping circles to feel your breast as you move slowly up toward the collar bone (  clavicle) near the base of the neck. Continue the up and down exam using all 3 pressures until you reach the middle of the chest. Do this with each breast, carefully feeling for lumps or changes. 3.  Keep a written record with breast changes or normal findings for each breast. By writing this information down, you do not need to depend only on memory for size,  tenderness, or location. Write down where you are in your menstrual cycle, if you are still menstruating. Breast tissue can have some lumps or thick tissue. However, see your caregiver if you find anything that concerns you.  SEEK MEDICAL CARE IF:  You see a change in shape, contour, or size of your breasts or nipples.   You see skin changes, such as reddened or scaly areas on the breasts or nipples.   You have an unusual discharge from your nipples.   You feel a new lump or unusually thick areas.  Document Released: 07/23/2005 Document Revised: 07/09/2012 Document Reviewed: 11/07/2011 [facility] Patient Information 2014 [place].

## 2013-09-15 NOTE — ED Notes (Addendum)
Pt presents with leakage of left breast; mild pain; pt states leakage started about 3 months ago; has not seen PCP. Discharge appears to be clear with serum texture.

## 2013-09-15 NOTE — ED Provider Notes (Signed)
Medical screening examination/treatment/procedure(s) were performed by non-physician practitioner and as supervising physician I was immediately available for consultation/collaboration.    Teressa Lower, MD 09/15/13 [phone]

## 2013-09-15 NOTE — ED Provider Notes (Signed)
CSN: [id number]     Arrival date & time 09/14/13  1923 History   First MD Initiated Contact with Patient 09/15/13 [phone]     Chief Complaint  Patient presents with  . Breast Discharge     (Consider location/radiation/quality/duration/timing/severity/associated sxs/prior Treatment) HPI Comments: 53 year old female presents for discharge from her left nipple x2 months. Patient states that drainage has been intermittent, but more frequent over the last 2 weeks. She states that drainage is clear in nature. She denies any modifying factors of her symptoms. She denies associated fever, associated breast redness, heat to touch, or tenderness, shortness of breath, cough, and nausea or vomiting. Patient has not followed up with her primary care provider regarding this complaint. She denies ever having had a mammogram.  The history is provided by the patient. No language interpreter was used.    Past Medical History  Diagnosis Date  . Back pain    Past Surgical History  Procedure Laterality Date  . Back surgery    . Knee surgery     No family history on file. History  Substance Use Topics  . Smoking status: Former Research scientist (life sciences)  . Smokeless tobacco: Not on file  . Alcohol Use: No   OB History   Grav Para Term Preterm Abortions TAB SAB Ect Mult Living                 Review of Systems  Constitutional: Negative for fever.  Respiratory: Negative for shortness of breath.   Gastrointestinal: Negative for vomiting and diarrhea.  Skin: Negative for color change and rash.       +nipple discharge L breast  All other systems reviewed and are negative.    Allergies  Review of patient's allergies indicates no known allergies.  Home Medications   Current Outpatient Rx  Name  Route  Sig  Dispense  Refill  . nabumetone (RELAFEN) 500 MG tablet   Oral   Take 250-500 mg by mouth 2 (two) times daily as needed for pain.          [name] neomycin-bacitracin-polymyxin (NEOSPORIN) OINT   Topical   Apply  1 application topically as needed for irritation.         [name] OVER THE COUNTER MEDICATION   Oral   Take 1 tablet by mouth daily. "joint health"         . traMADol (ULTRAM) 50 MG tablet   Oral   Take 50 mg by mouth every 6 (six) hours as needed for pain.          BP 168/92  Pulse 78  Temp(Src) 98.2 F (36.8 C) (Oral)  Resp 20  Wt 269 lb 3.2 oz (122.108 kg)  SpO2 98%  Physical Exam  Nursing note and vitals reviewed. Constitutional: She is oriented to person, place, and time. She appears well-developed and well-nourished. No distress.  HENT:  Head: Normocephalic and atraumatic.  Eyes: Conjunctivae and EOM are normal. No scleral icterus.  Neck: Normal range of motion. Neck supple.  Cardiovascular: Normal rate, regular rhythm and normal heart sounds.   Pulmonary/Chest: Effort normal and breath sounds normal. No respiratory distress. She has no wheezes. She has no rales. Right breast exhibits no inverted nipple, no mass, no nipple discharge, no skin change and no tenderness. Left breast exhibits nipple discharge (Clear serous; scant). Left breast exhibits no inverted nipple, no mass, no skin change and no tenderness.  No erythema, heat to touch, or red linear streaking appreciated to left breast. No purulent  drainage from nipple. No tenderness to palpation or obvious masses on breast examination.  Abdominal: Soft. There is no tenderness. There is no rebound and no guarding.  Musculoskeletal: Normal range of motion.  Neurological: She is alert and oriented to person, place, and time.  Skin: Skin is warm and dry. No rash noted. She is not diaphoretic. No erythema. No pallor.  Psychiatric: She has a normal mood and affect. Her behavior is normal.    ED Course  Procedures (including critical care time) Labs Review Labs Reviewed - No data to display Imaging Review No results found.  EKG Interpretation   None       MDM   Final diagnoses:  Breast discharge   Uncomplicated  scant serous drainage from left nipple. Patient states the symptoms have been ongoing x2 months. Patient as well and nontoxic appearing, hemodynamically stable, and afebrile. No evidence of mastoiditis, cellulitis, or abscess. No tenderness appreciated on physical exam. Patient stable for discharge today with instruction to followup with the breast clinic as well as an OB/GYN. Return precautions provided and patient agreeable to plan with no unaddressed concerns.   Filed Vitals:   09/14/13 1957 09/15/13 0056  BP: 160/89 168/92  Pulse: 77 78  Temp: 98.2 F (36.8 C) 98.2 F (36.8 C)  TempSrc: Oral Oral  Resp: 18 20  Weight: 269 lb 3.2 oz (122.108 kg)   SpO2: 98% 98%       Antonietta Breach, PA-C 09/15/13 [phone]

## 2013-09-16 ENCOUNTER — Emergency Department (HOSPITAL_COMMUNITY)
Admission: EM | Admit: 2013-09-16 | Discharge: 2013-09-17 | Disposition: A | Discharge status: Home / Self Care | Payer: Self-pay | Attending: Emergency Medicine | Admitting: Emergency Medicine

## 2013-09-16 ENCOUNTER — Emergency Department (HOSPITAL_COMMUNITY): Payer: Self-pay

## 2013-09-16 ENCOUNTER — Encounter (HOSPITAL_COMMUNITY): Payer: Self-pay | Admitting: Emergency Medicine

## 2013-09-16 DIAGNOSIS — R569 Unspecified convulsions: Secondary | ICD-10-CM

## 2013-09-16 DIAGNOSIS — F29 Unspecified psychosis not due to a substance or known physiological condition: Secondary | ICD-10-CM | POA: Insufficient documentation

## 2013-09-16 DIAGNOSIS — Z79899 Other long term (current) drug therapy: Secondary | ICD-10-CM | POA: Insufficient documentation

## 2013-09-16 DIAGNOSIS — R209 Unspecified disturbances of skin sensation: Secondary | ICD-10-CM

## 2013-09-16 DIAGNOSIS — R519 Headache, unspecified: Secondary | ICD-10-CM

## 2013-09-16 DIAGNOSIS — R51 Headache: Secondary | ICD-10-CM

## 2013-09-16 DIAGNOSIS — Z87891 Personal history of nicotine dependence: Secondary | ICD-10-CM | POA: Insufficient documentation

## 2013-09-16 DIAGNOSIS — G43909 Migraine, unspecified, not intractable, without status migrainosus: Secondary | ICD-10-CM | POA: Insufficient documentation

## 2013-09-16 LAB — POCT I-STAT, CHEM 8
BUN: 12 mg/dL (ref 6–23)
CREATININE: 1 mg/dL (ref 0.50–1.10)
Calcium, Ion: 1.3 mmol/L — ABNORMAL HIGH (ref 1.12–1.23)
Chloride: 103 mEq/L (ref 96–112)
Glucose, Bld: 81 mg/dL (ref 70–99)
HCT: 40 % (ref 36.0–46.0)
Hemoglobin: 13.6 g/dL (ref 12.0–15.0)
Potassium: 4.1 mEq/L (ref 3.7–5.3)
Sodium: 141 mEq/L (ref 137–147)
TCO2: 24 mmol/L (ref 0–100)

## 2013-09-16 LAB — DIFFERENTIAL
BASOS PCT: 0 % (ref 0–1)
Basophils Absolute: 0 10*3/uL (ref 0.0–0.1)
EOS ABS: 0.1 10*3/uL (ref 0.0–0.7)
Eosinophils Relative: 1 % (ref 0–5)
LYMPHS ABS: 3.1 10*3/uL (ref 0.7–4.0)
Lymphocytes Relative: 41 % (ref 12–46)
MONOS PCT: 7 % (ref 3–12)
Monocytes Absolute: 0.5 10*3/uL (ref 0.1–1.0)
NEUTROS ABS: 3.9 10*3/uL (ref 1.7–7.7)
NEUTROS PCT: 52 % (ref 43–77)

## 2013-09-16 LAB — COMPREHENSIVE METABOLIC PANEL
ALBUMIN: 4.2 g/dL (ref 3.5–5.2)
ALK PHOS: 81 U/L (ref 39–117)
ALT: 22 U/L (ref 0–35)
AST: 22 U/L (ref 0–37)
BILIRUBIN TOTAL: 0.3 mg/dL (ref 0.3–1.2)
BUN: 13 mg/dL (ref 6–23)
CHLORIDE: 103 meq/L (ref 96–112)
CO2: 23 mEq/L (ref 19–32)
Calcium: 9.9 mg/dL (ref 8.4–10.5)
Creatinine, Ser: 0.9 mg/dL (ref 0.50–1.10)
GFR calc Af Amer: 85 mL/min — ABNORMAL LOW (ref >90)
GFR calc non Af Amer: 73 mL/min — ABNORMAL LOW (ref >90)
GLUCOSE: 82 mg/dL (ref 70–99)
POTASSIUM: 4.3 meq/L (ref 3.7–5.3)
SODIUM: 141 meq/L (ref 137–147)
TOTAL PROTEIN: 8.4 g/dL — AB (ref 6.0–8.3)

## 2013-09-16 LAB — CBC
HCT: 38 % (ref 36.0–46.0)
HEMOGLOBIN: 12.2 g/dL (ref 12.0–15.0)
MCH: 27.6 pg (ref 26.0–34.0)
MCHC: 32.1 g/dL (ref 30.0–36.0)
MCV: 86 fL (ref 78.0–100.0)
PLATELETS: 242 10*3/uL (ref 150–400)
RBC: 4.42 MIL/uL (ref 3.87–5.11)
RDW: 15.2 % (ref 11.5–15.5)
WBC: 7.5 10*3/uL (ref 4.0–10.5)

## 2013-09-16 LAB — URINALYSIS, ROUTINE W REFLEX MICROSCOPIC
BILIRUBIN URINE: NEGATIVE
Glucose, UA: NEGATIVE mg/dL
HGB URINE DIPSTICK: NEGATIVE
Ketones, ur: NEGATIVE mg/dL
Leukocytes, UA: NEGATIVE
Nitrite: NEGATIVE
Protein, ur: NEGATIVE mg/dL
SPECIFIC GRAVITY, URINE: 1.007 (ref 1.005–1.030)
UROBILINOGEN UA: 0.2 mg/dL (ref 0.0–1.0)
pH: 6 (ref 5.0–8.0)

## 2013-09-16 LAB — RAPID URINE DRUG SCREEN, HOSP PERFORMED
AMPHETAMINES: NOT DETECTED
Barbiturates: NOT DETECTED
Benzodiazepines: NOT DETECTED
COCAINE: NOT DETECTED
Opiates: NOT DETECTED
TETRAHYDROCANNABINOL: NOT DETECTED

## 2013-09-16 LAB — APTT: APTT: 28 s (ref 24–37)

## 2013-09-16 LAB — POCT I-STAT TROPONIN I: TROPONIN I, POC: 0.01 ng/mL (ref 0.00–0.08)

## 2013-09-16 LAB — GLUCOSE, CAPILLARY: Glucose-Capillary: 90 mg/dL (ref 70–99)

## 2013-09-16 LAB — PROTIME-INR
INR: 1.02 (ref 0.00–1.49)
PROTHROMBIN TIME: 13.2 s (ref 11.6–15.2)

## 2013-09-16 LAB — TROPONIN I

## 2013-09-16 MED ORDER — SODIUM CHLORIDE 0.9 % IV BOLUS (SEPSIS)
1000.0000 mL | Freq: Once | INTRAVENOUS | Status: AC
  Administered 2013-09-16: 1000 mL via INTRAVENOUS

## 2013-09-16 NOTE — ED Notes (Signed)
I asked the patient if she needed to pee so we could get a sample and she said "that boy just got some."  Patient also remembered that she has an appointment to see a doctor because her left breast has pus and fluid coming out of it.  She asked me if I could make sure it wasn't leaking.  So, she does remember appointments and her "left breast" that is leaking.

## 2013-09-16 NOTE — ED Provider Notes (Signed)
Date: 09/16/2013  Rate: 71  Rhythm: normal sinus rhythm  QRS Axis: normal  Intervals: normal  ST/T Wave abnormalities: nonspecific ST/T changes  Conduction Disutrbances:none  Narrative Interpretation:   Old EKG Reviewed: none available    Ezequiel Essex, MD 09/16/13 2002

## 2013-09-16 NOTE — ED Notes (Signed)
Family at bedside. 

## 2013-09-16 NOTE — ED Provider Notes (Signed)
CSN: [id number]     Arrival date & time 09/16/13  1811 History   First MD Initiated Contact with Patient 09/16/13 1901     Chief Complaint  Patient presents with  . Altered Mental Status     (Consider location/radiation/quality/duration/timing/severity/associated sxs/prior Treatment) Patient is a 53 y.o. female presenting with Acute Neurological Problem. The history is provided by the patient.  Cerebrovascular Accident This is a new problem. The current episode started today. The problem occurs constantly. The problem has been unchanged. Associated symptoms include headaches and weakness (right sided). Pertinent negatives include no abdominal pain, arthralgias, chest pain, chills, congestion, fatigue, fever, myalgias, nausea, neck pain, vertigo, visual change or vomiting. Nothing aggravates the symptoms. She has tried nothing for the symptoms. The treatment provided no relief.    53 year old female with the chief complaints of headache and right-sided weakness. Bystander noticed this around 5 this afternoon. EMS was called and she was taken here. Patient complaining of allover headache. Patient states that this could feel like her to buckle migraines that she's had in the past. Patient denies any prior episodes of numbness tingling associated with her headaches. Patient arrived as a code stroke. Taken urgently to scanner. Past Medical History  Diagnosis Date  . Back pain    Past Surgical History  Procedure Laterality Date  . Back surgery    . Knee surgery     No family history on file. History  Substance Use Topics  . Smoking status: Former Research scientist (life sciences)  . Smokeless tobacco: Not on file  . Alcohol Use: No   OB History   Grav Para Term Preterm Abortions TAB SAB Ect Mult Living                 Review of Systems  Constitutional: Negative for fever, chills and fatigue.  HENT: Negative for congestion and rhinorrhea.   Eyes: Negative for redness and visual disturbance.  Respiratory:  Negative for shortness of breath and wheezing.   Cardiovascular: Negative for chest pain and palpitations.  Gastrointestinal: Negative for nausea, vomiting and abdominal pain.  Genitourinary: Negative for dysuria and urgency.  Musculoskeletal: Negative for arthralgias, myalgias and neck pain.  Skin: Negative for pallor and wound.  Neurological: Positive for weakness (right sided) and headaches. Negative for dizziness and vertigo.      Allergies  Review of patient's allergies indicates no known allergies.  Home Medications   Current Outpatient Rx  Name  Route  Sig  Dispense  Refill  . nabumetone (RELAFEN) 500 MG tablet   Oral   Take 250-500 mg by mouth 2 (two) times daily as needed for pain.          [name] neomycin-bacitracin-polymyxin (NEOSPORIN) OINT   Topical   Apply 1 application topically as needed for irritation.         [name] OVER THE COUNTER MEDICATION   Oral   Take 1 tablet by mouth daily. "joint health"         . traMADol (ULTRAM) 50 MG tablet   Oral   Take 50 mg by mouth every 6 (six) hours as needed for pain.          BP 131/73  Pulse 65  Temp(Src) 98 F (36.7 C) (Oral)  Resp 19  SpO2 93% Physical Exam  Constitutional: She appears well-developed and well-nourished. No distress.  HENT:  Head: Normocephalic and atraumatic.  Eyes: EOM are normal. Pupils are equal, round, and reactive to light.  Neck: Normal range of motion. Neck supple.  Cardiovascular: Normal rate and regular rhythm.  Exam reveals no gallop and no friction rub.   No murmur heard. Pulmonary/Chest: Effort normal. She has no wheezes. She has no rales.  Abdominal: Soft. She exhibits no distension. There is no tenderness.  Musculoskeletal: She exhibits no edema and no tenderness.  Neurological: She is alert. GCS eye subscore is 4. GCS verbal subscore is 4. GCS motor subscore is 6. She displays no Babinski's sign on the right side. She displays no Babinski's sign on the left side.  Reflex  Scores:      Tricep reflexes are 2+ on the right side and 2+ on the left side.      Bicep reflexes are 2+ on the right side and 2+ on the left side.      Brachioradialis reflexes are 2+ on the right side and 2+ on the left side.      Patellar reflexes are 2+ on the right side and 2+ on the left side.      Achilles reflexes are 2+ on the right side and 2+ on the left side. Patient confused to situation and location. Patient unable to move right lower extremity against gravity. Patient with no noted squeeze on the right upper extremity. Patient with subjective decreased sensation to the right side of her body. Equal smile  Skin: Skin is warm and dry. She is not diaphoretic.  Psychiatric: She has a normal mood and affect. Her behavior is normal.    ED Course  Procedures (including critical care time) Labs Review Labs Reviewed  COMPREHENSIVE METABOLIC PANEL - Abnormal; Notable for the following:    Total Protein 8.4 (*)    GFR calc non Af Amer 73 (*)    GFR calc Af Amer 85 (*)    All other components within normal limits  POCT I-STAT, CHEM 8 - Abnormal; Notable for the following:    Calcium, Ion 1.30 (*)    All other components within normal limits  PROTIME-INR  APTT  CBC  DIFFERENTIAL  TROPONIN I  URINE RAPID DRUG SCREEN (HOSP PERFORMED)  URINALYSIS, ROUTINE W REFLEX MICROSCOPIC  GLUCOSE, CAPILLARY  ETHANOL  POCT I-STAT TROPONIN I   Imaging Review Ct Head Wo Contrast  09/16/2013   CLINICAL DATA:  Code stroke, confusion  EXAM: CT HEAD WITHOUT CONTRAST  TECHNIQUE: Contiguous axial images were obtained from the base of the skull through the vertex without intravenous contrast.  COMPARISON:  CT HEAD W/O CM dated 02/22/2008  FINDINGS: No mass lesion. No midline shift. No acute hemorrhage or hematoma. No extra-axial fluid collections. No evidence of acute infarction.Calvarium is intact.  IMPRESSION: Normal CT.  Critical Value/emergent results were called by telephone at the time of  interpretation on 09/16/2013 at 7:05 PM to Dr. Wyvonnia Dusky , who verbally acknowledged these results.   Electronically Signed   By: Skipper Cliche M.D.   On: 09/16/2013 19:05   Mr Jodene Nam Head Wo Contrast  09/16/2013   CLINICAL DATA:  Confusion  EXAM: MRI HEAD WITHOUT CONTRAST  MRA HEAD WITHOUT CONTRAST  TECHNIQUE: Multiplanar, multiecho pulse sequences of the brain and surrounding structures were obtained without intravenous contrast. Angiographic images of the head were obtained using MRA technique without contrast.  COMPARISON:  None.  FINDINGS: MRI HEAD FINDINGS  The CSF containing spaces are within normal limits for patient age. A few scattered T2/FLAIR hyperintense foci are seen within the periventricular and deep white matter, nonspecific, but likely related to chronic microvascular ischemic changes. No mass lesion, midline  shift, or extra-axial fluid collection. Tiny T1 hyperintense focus seen in the region of the cord plexus in the atria of the left lateral ventricle may represent a small lipoma. Ventricles are normal in size without evidence of hydrocephalus.  No diffusion-weighted signal abnormality is identified to suggest acute intracranial infarct. Gray-white matter differentiation is maintained. Normal flow voids are seen within the intracranial vasculature. No intracranial hemorrhage identified. Mild prominence of the perivascular spaces is seen, which may be related to underlying hypertension.  The cervicomedullary junction is normal. Incidental note made of a partially empty sella. Pituitary stalk is midline. The globes and optic nerves demonstrate a normal appearance with normal signal intensity. The  The bone marrow signal intensity is normal. Calvarium is intact. Visualized upper cervical spine is within normal limits.  Scalp soft tissues are unremarkable.  Small amount of T2 hyperintense fluid density seen within the left maxillary sinus. The paranasal sinuses are otherwise clear.  MRA HEAD FINDINGS   The visualized portions of the distal cervical segments of the internal carotid arteries are widely patent with antegrade flow bilaterally. The petrous, cavernous, and supra clinoid segments of the internal carotid arteries are widely patent bilaterally. A1 segments are symmetric she anterior communicating artery is normal. Anterior cerebral arteries are widely patent with antegrade flow. M1 segments are widely patent bilaterally without evidence of high-grade stenosis or proximal branch occlusion. Distal MCA branches are symmetric bilaterally. No aneurysm seen within the anterior circulation.  The vertebral arteries are codominant. Posterior inferior cerebellar arteries are normal. Vertebrobasilar junction is within normal limits. Basilar artery is widely patent with antegrade flow without evidence of basilar tip stenosis or aneurysm. Posterior cerebral arteries are widely patent bilaterally. Superior cerebellar arteries and anterior inferior cerebellar arteries are within normal limits. No aneurysm seen within the posterior circulation  IMPRESSION: MRI HEAD:  1. No acute intracranial infarct or other abnormality identified. 2. Few scattered subcentimeter T2/FLAIR hyperintense foci within the periventricular and deep white matter, nonspecific, but may be related to mild chronic microvascular ischemic changes. Foci such as these have also been described in migrainous disorders, which may be a possibility given the history of migraine in this patient.  MRA HEAD:  Normal MRA of the brain without evidence of high-grade flow-limiting stenosis, proximal branch occlusion, or intracranial aneurysm.   Electronically Signed   By: Jeannine Boga M.D.   On: 09/16/2013 22:38   Mr Brain Wo Contrast  09/16/2013   CLINICAL DATA:  Confusion  EXAM: MRI HEAD WITHOUT CONTRAST  MRA HEAD WITHOUT CONTRAST  TECHNIQUE: Multiplanar, multiecho pulse sequences of the brain and surrounding structures were obtained without intravenous  contrast. Angiographic images of the head were obtained using MRA technique without contrast.  COMPARISON:  None.  FINDINGS: MRI HEAD FINDINGS  The CSF containing spaces are within normal limits for patient age. A few scattered T2/FLAIR hyperintense foci are seen within the periventricular and deep white matter, nonspecific, but likely related to chronic microvascular ischemic changes. No mass lesion, midline shift, or extra-axial fluid collection. Tiny T1 hyperintense focus seen in the region of the cord plexus in the atria of the left lateral ventricle may represent a small lipoma. Ventricles are normal in size without evidence of hydrocephalus.  No diffusion-weighted signal abnormality is identified to suggest acute intracranial infarct. Gray-white matter differentiation is maintained. Normal flow voids are seen within the intracranial vasculature. No intracranial hemorrhage identified. Mild prominence of the perivascular spaces is seen, which may be related to underlying hypertension.  The  cervicomedullary junction is normal. Incidental note made of a partially empty sella. Pituitary stalk is midline. The globes and optic nerves demonstrate a normal appearance with normal signal intensity. The  The bone marrow signal intensity is normal. Calvarium is intact. Visualized upper cervical spine is within normal limits.  Scalp soft tissues are unremarkable.  Small amount of T2 hyperintense fluid density seen within the left maxillary sinus. The paranasal sinuses are otherwise clear.  MRA HEAD FINDINGS  The visualized portions of the distal cervical segments of the internal carotid arteries are widely patent with antegrade flow bilaterally. The petrous, cavernous, and supra clinoid segments of the internal carotid arteries are widely patent bilaterally. A1 segments are symmetric she anterior communicating artery is normal. Anterior cerebral arteries are widely patent with antegrade flow. M1 segments are widely patent  bilaterally without evidence of high-grade stenosis or proximal branch occlusion. Distal MCA branches are symmetric bilaterally. No aneurysm seen within the anterior circulation.  The vertebral arteries are codominant. Posterior inferior cerebellar arteries are normal. Vertebrobasilar junction is within normal limits. Basilar artery is widely patent with antegrade flow without evidence of basilar tip stenosis or aneurysm. Posterior cerebral arteries are widely patent bilaterally. Superior cerebellar arteries and anterior inferior cerebellar arteries are within normal limits. No aneurysm seen within the posterior circulation  IMPRESSION: MRI HEAD:  1. No acute intracranial infarct or other abnormality identified. 2. Few scattered subcentimeter T2/FLAIR hyperintense foci within the periventricular and deep white matter, nonspecific, but may be related to mild chronic microvascular ischemic changes. Foci such as these have also been described in migrainous disorders, which may be a possibility given the history of migraine in this patient.  MRA HEAD:  Normal MRA of the brain without evidence of high-grade flow-limiting stenosis, proximal branch occlusion, or intracranial aneurysm.   Electronically Signed   By: Jeannine Boga M.D.   On: 09/16/2013 22:38    EKG Interpretation   None       MDM   Final diagnoses:  Headache  Migraine    Rakesha Mileto is a 53 y.o. female who presents to the ED who presents to the ED as a CODE STROKE for neurologic deficits, including confusion, headache and right sided weakness. Symptoms started at 5pm.  Patient to the CT scanner urgently.   CT scan demonstrates no acute intercranial abnormality.   Patient seen and evaluated by myself and my attending, Dr. Wyvonnia Dusky.   The patient to get an MRI/MRA per neurology   Patient with negative MRI MRA. Patient feeling much better on reassessment. Patient ambulatory with some assistance. Family feels that she is okay at  home. They'll follow with her PCP.  12:24 AM:  I have discussed the diagnosis/risks/treatment options with the patient and family and believe the pt to be eligible for discharge home to follow-up with PCP. We also discussed returning to the ED immediately if new or worsening sx occur. We discussed the sx which are most concerning (e.g., repeat episode) that necessitate immediate return. Medications administered to the patient during their visit and any new prescriptions provided to the patient are listed below.  Medications given during this visit Medications  sodium chloride 0.9 % bolus 1,000 mL (0 mLs Intravenous Stopped 09/16/13 2247)    New Prescriptions   No medications on file      Deno Etienne, MD 09/17/13 [id number]

## 2013-09-16 NOTE — ED Notes (Addendum)
Family was at the bedside and they were talking about some medications that she takes.  One of the family members started naming her medications and forgot one of them and then she said the name of the medication she took.  I asked her if she was remembering her date of birth and what month it is and she said no.  The patient said she remembers she took her medications last night and then she says she does not remember anything else.   The visitor asked the patient what side was weak and she moved her right arm and said, "this side".

## 2013-09-16 NOTE — ED Notes (Signed)
Activated CODE STROKE

## 2013-09-16 NOTE — ED Notes (Signed)
Pt presents to department for evaluation of confusion and headache. Onset 17:00 this afternoon. Family states she became confused and unable to answer questions appropriately. Upon arrival pt is alert, but confused and unable to answer questions correctly. Weakness noted to both legs and arms. Family states she has been stressed due to several deaths of people close to her.

## 2013-09-16 NOTE — Consult Note (Signed)
NEURO HOSPITALIST CONSULT NOTE    Reason for Consult: Referring Physician: ED    Chief Complaint: CODE STROKE: HA, CONFUSION, RIGHT SIDED HEMIPARESIS-NUMBNESS   HPI:                                                                                                                                         Cathy [name] is an 53 y.o. female, right handed, with a past medical history significant for episodic migraine, chronic back pain, brought to [facility] ED by ambulance as a code stroke due to acute onset of the above stated symptoms. She was last known well around 5 pm today. She had recent loss in the family, and today developed sudden onset of severe, pounding, incapacitating HA with photophobia, confusion, and right sided weakness-numbness. Family states she became confused and unable to answer questions appropriately. Upon arrival pt is alert, but confused and unable to answer questions correctly Stated that prior migraines had never been associated with numbness-weakness in one side of her body. Denies vertigo, double vision, slurred speech, language or vision impairment. No fever, chills, or head trauma. Initial NIHSS 6. CT brain showed no acute abnormality.   Date last known well: 09/16/13 Time last known well: 5 pm tPA Given: no, inconsistent examination and current presentation more suggestive of complicated migraqine NIHSS: 6  Past Medical History  Diagnosis Date  . Back pain     Past Surgical History  Procedure Laterality Date  . Back surgery    . Knee surgery      No family history on file. Social History:  reports that she has quit smoking. She does not have any smokeless tobacco history on file. She reports that she does not drink alcohol or use illicit drugs.  Allergies: No Known Allergies  Medications:                                                                                                                           I have reviewed the  patient's current medications.  ROS:  History obtained from the patient, family, and chart review.  General ROS: negative for - chills, fatigue, fever, night sweats, or weight loss Psychological ROS: negative for - behavioral disorder, hallucinations, memory difficulties, mood swings or suicidal ideation Ophthalmic ROS: negative for - blurry vision, double vision, eye pain or loss of vision ENT ROS: negative for - epistaxis, nasal discharge, oral lesions, sore throat, tinnitus or vertigo Allergy and Immunology ROS: negative for - hives or itchy/watery eyes Hematological and Lymphatic ROS: negative for - bleeding problems, bruising or swollen lymph nodes Endocrine ROS: negative for - galactorrhea, hair pattern changes, polydipsia/polyuria or temperature intolerance Respiratory ROS: negative for - cough, hemoptysis, shortness of breath or wheezing Cardiovascular ROS: negative for - chest pain, dyspnea on exertion, edema or irregular heartbeat Gastrointestinal ROS: negative for - abdominal pain, diarrhea, hematemesis, nausea/vomiting or stool incontinence Genito-Urinary ROS: negative for - dysuria, hematuria, incontinence or urinary frequency/urgency Musculoskeletal ROS: negative for - joint swelling Neurological ROS: as noted in HPI Dermatological ROS: negative for rash and skin lesion changes     Physical exam: pleasant female in distress due to severe headache. Blood pressure 134/102, pulse 75, temperature 99.1 F (37.3 C), temperature source Oral, resp. rate 16, SpO2 100.00%. Head: normocephalic. Neck: supple, no bruits, no JVD. Cardiac: no murmurs. Lungs: clear. Abdomen: soft, no tender, no mass. Extremities: no edema.  Neurologic Examination:                                                                                                      Mental  Status: Alert, awake, disoriented to year-month-year-place.  Speech fluent without evidence of aphasia.  Able to follow 3 step commands without difficulty. Cranial Nerves: II: Discs flat bilaterally; Visual fields grossly normal, pupils equal, round, reactive to light and accommodation III,IV, VI: ptosis not present, extra-ocular motions intact bilaterally V,VII: smile symmetric, facial light touch sensation normal bilaterally VIII: hearing normal bilaterally IX,X: gag reflex present XI: bilateral shoulder shrug XII: midline tongue extension without atrophy or fasciculations  Motor: Right sided weakness but inconsistent exam. Tone and bulk:normal tone throughout; no atrophy noted Sensory: Pinprick and light touch impaired in the right side. Deep Tendon Reflexes:  1+ all over Plantars: Right: downgoing   Left: downgoing Cerebellar: normal finger-to-nose,  normal heel-to-shin test in the left but couldn't teat in the right side due to ? weakness Gait:  No tested. CV: pulses palpable throughout    Results for orders placed during the hospital encounter of 09/16/13 (from the past 48 hour(s))  PROTIME-INR     Status: None   Collection Time    09/16/13  6:55 PM      Result Value Ref Range   Prothrombin Time 13.2  11.6 - 15.2 seconds   INR 1.02  0.00 - 1.49  APTT     Status: None   Collection Time    09/16/13  6:55 PM      Result Value Ref Range   aPTT 28  24 - 37 seconds  CBC     Status: None   Collection Time  09/16/13  6:55 PM      Result Value Ref Range   WBC 7.5  4.0 - 10.5 K/uL   RBC 4.42  3.87 - 5.11 MIL/uL   Hemoglobin 12.2  12.0 - 15.0 g/dL   HCT 38.0  36.0 - 46.0 %   MCV 86.0  78.0 - 100.0 fL   MCH 27.6  26.0 - 34.0 pg   MCHC 32.1  30.0 - 36.0 g/dL   RDW 15.2  11.5 - 15.5 %   Platelets 242  150 - 400 K/uL  DIFFERENTIAL     Status: None   Collection Time    09/16/13  6:55 PM      Result Value Ref Range   Neutrophils Relative % 52  43 - 77 %   Neutro Abs 3.9   1.7 - 7.7 K/uL   Lymphocytes Relative 41  12 - 46 %   Lymphs Abs 3.1  0.7 - 4.0 K/uL   Monocytes Relative 7  3 - 12 %   Monocytes Absolute 0.5  0.1 - 1.0 K/uL   Eosinophils Relative 1  0 - 5 %   Eosinophils Absolute 0.1  0.0 - 0.7 K/uL   Basophils Relative 0  0 - 1 %   Basophils Absolute 0.0  0.0 - 0.1 K/uL  COMPREHENSIVE METABOLIC PANEL     Status: Abnormal   Collection Time    09/16/13  6:55 PM      Result Value Ref Range   Sodium 141  137 - 147 mEq/L   Potassium 4.3  3.7 - 5.3 mEq/L   Chloride 103  96 - 112 mEq/L   CO2 23  19 - 32 mEq/L   Glucose, Bld 82  70 - 99 mg/dL   BUN 13  6 - 23 mg/dL   Creatinine, Ser 0.90  0.50 - 1.10 mg/dL   Calcium 9.9  8.4 - 10.5 mg/dL   Total Protein 8.4 (*) 6.0 - 8.3 g/dL   Albumin 4.2  3.5 - 5.2 g/dL   AST 22  0 - 37 U/L   ALT 22  0 - 35 U/L   Alkaline Phosphatase 81  39 - 117 U/L   Total Bilirubin 0.3  0.3 - 1.2 mg/dL   GFR calc non Af Amer 73 (*) >90 mL/min   GFR calc Af Amer 85 (*) >90 mL/min   Comment: (NOTE)     The eGFR has been calculated using the CKD EPI equation.     This calculation has not been validated in all clinical situations.     eGFR's persistently <90 mL/min signify possible Chronic Kidney     Disease.  TROPONIN I     Status: None   Collection Time    09/16/13  6:55 PM      Result Value Ref Range   Troponin I <0.30  <0.30 ng/mL   Comment:            Due to the release kinetics of cTnI,     a negative result within the first hours     of the onset of symptoms does not rule out     myocardial infarction with certainty.     If myocardial infarction is still suspected,     repeat the test at appropriate intervals.  GLUCOSE, CAPILLARY     Status: None   Collection Time    09/16/13  7:14 PM      Result Value Ref Range   Glucose-Capillary 90  70 -  99 mg/dL  POCT I-STAT TROPONIN I     Status: None   Collection Time    09/16/13  7:22 PM      Result Value Ref Range   Troponin i, poc 0.01  0.00 - 0.08 ng/mL   Comment  3            Comment: Due to the release kinetics of cTnI,     a negative result within the first hours     of the onset of symptoms does not rule out     myocardial infarction with certainty.     If myocardial infarction is still suspected,     repeat the test at appropriate intervals.  POCT I-STAT, CHEM 8     Status: Abnormal   Collection Time    09/16/13  7:24 PM      Result Value Ref Range   Sodium 141  137 - 147 mEq/L   Potassium 4.1  3.7 - 5.3 mEq/L   Chloride 103  96 - 112 mEq/L   BUN 12  6 - 23 mg/dL   Creatinine, Ser 1.00  0.50 - 1.10 mg/dL   Glucose, Bld 81  70 - 99 mg/dL   Calcium, Ion 1.30 (*) 1.12 - 1.23 mmol/L   TCO2 24  0 - 100 mmol/L   Hemoglobin 13.6  12.0 - 15.0 g/dL   HCT 40.0  36.0 - 46.0 %   Ct Head Wo Contrast  09/16/2013   CLINICAL DATA:  Code stroke, confusion  EXAM: CT HEAD WITHOUT CONTRAST  TECHNIQUE: Contiguous axial images were obtained from the base of the skull through the vertex without intravenous contrast.  COMPARISON:  CT HEAD W/O CM dated 02/22/2008  FINDINGS: No mass lesion. No midline shift. No acute hemorrhage or hematoma. No extra-axial fluid collections. No evidence of acute infarction.Calvarium is intact.  IMPRESSION: Normal CT.  Critical Value/emergent results were called by telephone at the time of interpretation on 09/16/2013 at 7:05 PM to Dr. Wyvonnia Dusky , who verbally acknowledged these results.   Electronically Signed   By: Skipper Cliche M.D.   On: 09/16/2013 19:05    Assessment: 53 y.o. female brought in as a code stroke due to acute onset severe HA, photophobia, confusion, and right hemiparesis-numbness. NIHSS 6 but very inconsistent exam. CT brain unremarkable.  Although she is within the window for tpa, her neuro-exam is very inconsistent and her presentation is suggestive of a complicated migraine, and thus thrombolysis was not administered. Will get MRI/MRA brain and then proceed accordingly.    Dorian Pod, MD [facility] [phone]  09/16/2013, 7:52 PM

## 2013-09-16 NOTE — Progress Notes (Signed)
Code Stroke called at [facility] while patient in triage.  Per staff patient became acutely confused at 1700 this afternoon.  Labs drawn, head CT done. Patient complaining of HA, and the lights bother her.  NIHSS 6  Patient unable to state age or month does eventually answer that she is 30 or 52, Right arm and leg weakness, right sensory deficit.  Patient states that she has a history of migraines.  Dr Armida Sans at bedside to assess patient.

## 2013-09-16 NOTE — Discharge Instructions (Signed)

## 2013-09-16 NOTE — ED Notes (Addendum)
Patient transported to MRI 

## 2013-09-17 NOTE — ED Provider Notes (Signed)
I saw and evaluated the patient, reviewed the resident's note and I agree with the findings and plan. If applicable, I agree with the resident's interpretation of the EKG.  If applicable, I was present for critical portions of any procedures performed.  Confusion, headache, R sided weakness onset 1700, code stroke on arrival. Hx migraines.  Headache progressively worsened since onset, denies thunderclap onset. No facial asymmetry, inconsistent weakness on R side. Neurology favors migraine.  Will check MRI/MRA.  Ezequiel Essex, MD 09/17/13 [phone]

## 2013-10-26 ENCOUNTER — Encounter: Payer: Self-pay | Admitting: Obstetrics & Gynecology

## 2014-02-01 ENCOUNTER — Encounter (HOSPITAL_COMMUNITY): Payer: Self-pay | Admitting: Emergency Medicine

## 2014-02-01 ENCOUNTER — Emergency Department (HOSPITAL_COMMUNITY)
Admission: EM | Admit: 2014-02-01 | Discharge: 2014-02-01 | Disposition: A | Discharge status: Home / Self Care | Payer: Self-pay | Attending: Emergency Medicine | Admitting: Emergency Medicine

## 2014-02-01 ENCOUNTER — Emergency Department (HOSPITAL_COMMUNITY): Payer: Self-pay

## 2014-02-01 DIAGNOSIS — W219XXA Striking against or struck by unspecified sports equipment, initial encounter: Secondary | ICD-10-CM | POA: Insufficient documentation

## 2014-02-01 DIAGNOSIS — Z87891 Personal history of nicotine dependence: Secondary | ICD-10-CM | POA: Insufficient documentation

## 2014-02-01 DIAGNOSIS — Y9372 Activity, wrestling: Secondary | ICD-10-CM | POA: Insufficient documentation

## 2014-02-01 DIAGNOSIS — Y92838 Other recreation area as the place of occurrence of the external cause: Secondary | ICD-10-CM

## 2014-02-01 DIAGNOSIS — S6980XA Other specified injuries of unspecified wrist, hand and finger(s), initial encounter: Secondary | ICD-10-CM | POA: Insufficient documentation

## 2014-02-01 DIAGNOSIS — M79644 Pain in right finger(s): Secondary | ICD-10-CM

## 2014-02-01 DIAGNOSIS — S6990XA Unspecified injury of unspecified wrist, hand and finger(s), initial encounter: Principal | ICD-10-CM | POA: Insufficient documentation

## 2014-02-01 DIAGNOSIS — Y9239 Other specified sports and athletic area as the place of occurrence of the external cause: Secondary | ICD-10-CM | POA: Insufficient documentation

## 2014-02-01 NOTE — ED Provider Notes (Signed)
CSN: [id number]     Arrival date & time 02/01/14  1214 History  This chart was scribed for non-physician practitioner Hyman Bible, PA-C working with Houston Siren III, * by Ludger Nutting, ED Scribe. This patient was seen in room TR09C/TR09C and the patient's care was started at 1:31 PM.    Chief Complaint  Patient presents with  . Hand Pain      The history is provided by the patient. No language interpreter was used.    HPI Comments: Cathy [name] is a 53 y.o. female who presents to the Emergency Department complaining of a left 4th digit injury that occurred 4-5 days ago while wrestling. She complains of constant, unchanged pain with associated swelling. She has not taken any OTC medications for her pain. She denies numbness or weakness of the finger.   Past Medical History  Diagnosis Date  . Back pain    Past Surgical History  Procedure Laterality Date  . Back surgery    . Knee surgery     No family history on file. History  Substance Use Topics  . Smoking status: Former Research scientist (life sciences)  . Smokeless tobacco: Not on file  . Alcohol Use: No   OB History   Grav Para Term Preterm Abortions TAB SAB Ect Mult Living                 Review of Systems  Musculoskeletal: Positive for arthralgias (left finger pain).  All other systems reviewed and are negative.     Allergies  Review of patient's allergies indicates no known allergies.  Home Medications   Prior to Admission medications   Not on File   BP 125/55  Pulse 61  Temp(Src) 98.4 F (36.9 C) (Oral)  Resp 20  Ht 5\' 3"  (1.6 m)  Wt 235 lb (106.595 kg)  BMI 41.64 kg/m2  SpO2 100% Physical Exam  Nursing note and vitals reviewed. Constitutional: She is oriented to person, place, and time. She appears well-developed and well-nourished.  HENT:  Head: Normocephalic and atraumatic.  Cardiovascular: Normal rate, regular rhythm and normal heart sounds.   Pulmonary/Chest: Effort normal and breath sounds normal.   Abdominal: She exhibits no distension.  Musculoskeletal: She exhibits tenderness.  Pain of the PIP with movement of left 4th digit. Tenderness to palpation of the PIP. No obvious bruising, erythema, or edema. Skin intact, no lacerations.   Neurological: She is alert and oriented to person, place, and time.  Distal sensation of the left 4th digit intact.  Skin: Skin is warm and dry.  Good capillary refill of the left 4th digit  Psychiatric: She has a normal mood and affect.    ED Course  Procedures (including critical care time)  DIAGNOSTIC STUDIES: Oxygen Saturation is 100% on RA, normal by my interpretation.    COORDINATION OF CARE: 1:31 PM Discussed treatment plan with pt at bedside and pt agreed to plan.   Labs Review Labs Reviewed - No data to display  Imaging Review Dg Finger Ring Left  02/01/2014   CLINICAL DATA:  Injured left ring finger with pain involving the PIP joint.  EXAM: LEFT RING FINGER 2+V  COMPARISON:  None.  FINDINGS: No evidence of acute fracture or dislocation. Joint spaces well preserved. Well-preserved bone mineral density. No intrinsic osseous abnormalities.  IMPRESSION: Normal examination.   Electronically Signed   By: Evangeline Dakin M.D.   On: 02/01/2014 14:21     EKG Interpretation None      MDM  Final diagnoses:  None   Patient with a finger injury.  Xray negative.  Neurovascularly intact.  Patient given finger splint.  Patient stable for discharge.  Return precautions given.    Hyman Bible, PA-C 02/01/14 1654

## 2014-02-01 NOTE — ED Notes (Signed)
Pt with left hand 3rd digit pain. Pt states she was wrestling when she hurt it.

## 2014-02-01 NOTE — ED Notes (Signed)
Ortho tech on way to place splint.

## 2014-02-01 NOTE — Progress Notes (Signed)
Orthopedic Tech Progress Note Patient Details:  Cathy [name] 06/25/1961 [id number]  Ortho Devices Type of Ortho Device: Finger splint Ortho Device/Splint Location: finger splint applied doral of left ring finger. Ortho Device/Splint Interventions: Application   Ashok Cordia 02/01/2014, 3:18 PM

## 2014-02-02 NOTE — Discharge Planning (Signed)
Carl Liaison was not able to see the patient, [facility] orange card information and resource guide will be mailed to the address listed.

## 2014-02-03 NOTE — ED Provider Notes (Signed)
Medical screening examination/treatment/procedure(s) were performed by non-physician practitioner and as supervising physician I was immediately available for consultation/collaboration.   Houston Siren III, MD 02/03/14 1017

## 2015-03-31 ENCOUNTER — Encounter (HOSPITAL_COMMUNITY): Payer: Self-pay | Admitting: Emergency Medicine

## 2015-03-31 ENCOUNTER — Emergency Department (HOSPITAL_COMMUNITY): Payer: Self-pay

## 2015-03-31 ENCOUNTER — Emergency Department (HOSPITAL_COMMUNITY)
Admission: EM | Admit: 2015-03-31 | Discharge: 2015-03-31 | Disposition: A | Discharge status: Home / Self Care | Payer: Self-pay | Attending: Emergency Medicine | Admitting: Emergency Medicine

## 2015-03-31 DIAGNOSIS — M25512 Pain in left shoulder: Secondary | ICD-10-CM

## 2015-03-31 DIAGNOSIS — Z87891 Personal history of nicotine dependence: Secondary | ICD-10-CM | POA: Insufficient documentation

## 2015-03-31 DIAGNOSIS — I10 Essential (primary) hypertension: Secondary | ICD-10-CM

## 2015-03-31 LAB — I-STAT CHEM 8, ED
BUN: 17 mg/dL (ref 6–20)
CALCIUM ION: 1.21 mmol/L (ref 1.12–1.23)
CREATININE: 1 mg/dL (ref 0.44–1.00)
Chloride: 107 mmol/L (ref 101–111)
Glucose, Bld: 92 mg/dL (ref 65–99)
HCT: 35 % — ABNORMAL LOW (ref 36.0–46.0)
Hemoglobin: 11.9 g/dL — ABNORMAL LOW (ref 12.0–15.0)
Potassium: 3.9 mmol/L (ref 3.5–5.1)
SODIUM: 142 mmol/L (ref 135–145)
TCO2: 23 mmol/L (ref 0–100)

## 2015-03-31 LAB — COMPREHENSIVE METABOLIC PANEL
ALBUMIN: 4 g/dL (ref 3.5–5.0)
ALK PHOS: 66 U/L (ref 38–126)
ALT: 21 U/L (ref 14–54)
ANION GAP: 9 (ref 5–15)
AST: 25 U/L (ref 15–41)
BILIRUBIN TOTAL: 0.5 mg/dL (ref 0.3–1.2)
BUN: 14 mg/dL (ref 6–20)
CALCIUM: 9.3 mg/dL (ref 8.9–10.3)
CO2: 22 mmol/L (ref 22–32)
CREATININE: 1.03 mg/dL — AB (ref 0.44–1.00)
Chloride: 108 mmol/L (ref 101–111)
GFR calc non Af Amer: >60 mL/min (ref >60)
GLUCOSE: 92 mg/dL (ref 65–99)
Potassium: 3.9 mmol/L (ref 3.5–5.1)
Sodium: 139 mmol/L (ref 135–145)
TOTAL PROTEIN: 7.1 g/dL (ref 6.5–8.1)

## 2015-03-31 LAB — CBC
HCT: 35.3 % — ABNORMAL LOW (ref 36.0–46.0)
HEMOGLOBIN: 11.1 g/dL — AB (ref 12.0–15.0)
MCH: 28.1 pg (ref 26.0–34.0)
MCHC: 31.4 g/dL (ref 30.0–36.0)
MCV: 89.4 fL (ref 78.0–100.0)
Platelets: 215 10*3/uL (ref 150–400)
RBC: 3.95 MIL/uL (ref 3.87–5.11)
RDW: 15.5 % (ref 11.5–15.5)
WBC: 6.2 10*3/uL (ref 4.0–10.5)

## 2015-03-31 LAB — DIFFERENTIAL
Basophils Absolute: 0 10*3/uL (ref 0.0–0.1)
Basophils Relative: 0 % (ref 0–1)
EOS PCT: 1 % (ref 0–5)
Eosinophils Absolute: 0 10*3/uL (ref 0.0–0.7)
LYMPHS ABS: 2.8 10*3/uL (ref 0.7–4.0)
LYMPHS PCT: 44 % (ref 12–46)
MONO ABS: 0.3 10*3/uL (ref 0.1–1.0)
Monocytes Relative: 5 % (ref 3–12)
Neutro Abs: 3.1 10*3/uL (ref 1.7–7.7)
Neutrophils Relative %: 50 % (ref 43–77)

## 2015-03-31 LAB — CBG MONITORING, ED: GLUCOSE-CAPILLARY: 91 mg/dL (ref 65–99)

## 2015-03-31 LAB — PROTIME-INR
INR: 1.06 (ref 0.00–1.49)
Prothrombin Time: 14 seconds (ref 11.6–15.2)

## 2015-03-31 LAB — I-STAT TROPONIN, ED: Troponin i, poc: 0 ng/mL (ref 0.00–0.08)

## 2015-03-31 LAB — APTT: aPTT: 29 seconds (ref 24–37)

## 2015-03-31 LAB — ETHANOL: Alcohol, Ethyl (B): <5 mg/dL (ref <5)

## 2015-03-31 MED ORDER — MORPHINE SULFATE (PF) 4 MG/ML IV SOLN
4.0000 mg | Freq: Once | INTRAVENOUS | Status: AC
  Administered 2015-03-31: 4 mg via INTRAVENOUS
  Filled 2015-03-31: qty 1

## 2015-03-31 MED ORDER — LORAZEPAM 2 MG/ML IJ SOLN
1.0000 mg | Freq: Once | INTRAMUSCULAR | Status: AC
  Administered 2015-03-31: 1 mg via INTRAVENOUS
  Filled 2015-03-31: qty 1

## 2015-03-31 MED ORDER — DIAZEPAM 2 MG PO TABS: 2.0000 mg | ORAL_TABLET | ORAL | Status: DC | PRN

## 2015-03-31 MED ORDER — OXYCODONE-ACETAMINOPHEN 5-325 MG PO TABS: 1.0000 | ORAL_TABLET | ORAL | Status: DC | PRN

## 2015-03-31 NOTE — ED Provider Notes (Signed)
CSN: [id number]     Arrival date & time 03/31/15  2008 History   First MD Initiated Contact with Patient 03/31/15 2044     Chief Complaint  Patient presents with  . Extremity Weakness     (Consider location/radiation/quality/duration/timing/severity/associated sxs/prior Treatment) HPI Comments: Patient here complaining of acute onset of pain in her left upper extremity 9 hours. Pain characterized as dull and worse with movement. No associated lower extremity weakness. No visual changes. Mild headache noted. No facial droop or speech difficulties. Symptoms better with rest. Denies any ataxia. Nothing makes her symptoms better. No treatment used prior to arrival  Patient is a 54 y.o. female presenting with extremity weakness. The history is provided by the patient.  Extremity Weakness    Past Medical History  Diagnosis Date  . Back pain    Past Surgical History  Procedure Laterality Date  . Back surgery    . Knee surgery     No family history on file. Social History  Substance Use Topics  . Smoking status: Former Research scientist (life sciences)  . Smokeless tobacco: None  . Alcohol Use: No   OB History    No data available     Review of Systems  Musculoskeletal: Positive for extremity weakness.  All other systems reviewed and are negative.     Allergies  Review of patient's allergies indicates no known allergies.  Home Medications   Prior to Admission medications   Not on File   BP 138/67 mmHg  Pulse 64  Temp(Src) 98.8 F (37.1 C) (Oral)  Resp 15  Ht 5\' 4"  (1.626 m)  Wt 233 lb 6 oz (105.858 kg)  BMI 40.04 kg/m2  SpO2 100% Physical Exam  Constitutional: She is oriented to person, place, and time. She appears well-developed and well-nourished.  Non-toxic appearance. No distress.  HENT:  Head: Normocephalic and atraumatic.  Eyes: Conjunctivae, EOM and lids are normal. Pupils are equal, round, and reactive to light.  Neck: Normal range of motion. Neck supple. No tracheal deviation  present. No thyroid mass present.  Cardiovascular: Normal rate, regular rhythm and normal heart sounds.  Exam reveals no gallop.   No murmur heard. Pulmonary/Chest: Effort normal and breath sounds normal. No stridor. No respiratory distress. She has no decreased breath sounds. She has no wheezes. She has no rhonchi. She has no rales.  Abdominal: Soft. Normal appearance and bowel sounds are normal. She exhibits no distension. There is no tenderness. There is no rebound and no CVA tenderness.  Musculoskeletal: She exhibits no edema.       Left shoulder: She exhibits decreased range of motion and tenderness. She exhibits no swelling.  Neurological: She is alert and oriented to person, place, and time. She displays no tremor. No cranial nerve deficit or sensory deficit. She exhibits normal muscle tone. Coordination and gait normal. GCS eye subscore is 4. GCS verbal subscore is 5. GCS motor subscore is 6.  Limited cooperation with strength exam at left upper extremity.  Skin: Skin is warm and dry. No abrasion and no rash noted.  Psychiatric: She has a normal mood and affect. Her speech is normal and behavior is normal.  Nursing note and vitals reviewed.   ED Course  Procedures (including critical care time) Labs Review Labs Reviewed  CBC - Abnormal; Notable for the following:    Hemoglobin 11.1 (*)    HCT 35.3 (*)    All other components within normal limits  I-STAT CHEM 8, ED - Abnormal; Notable for the following:  Hemoglobin 11.9 (*)    HCT 35.0 (*)    All other components within normal limits  DIFFERENTIAL  ETHANOL  PROTIME-INR  APTT  COMPREHENSIVE METABOLIC PANEL  URINE RAPID DRUG SCREEN, HOSP PERFORMED  URINALYSIS, ROUTINE W REFLEX MICROSCOPIC (NOT AT [facility])  I-STAT TROPOININ, ED  CBG MONITORING, ED    Imaging Review Ct Head Wo Contrast  03/31/2015   CLINICAL DATA:  Left-sided weakness and headache. Left arm weakness and numbness, onset 12:30 p.m. today, 8 hours prior.  EXAM:  CT HEAD WITHOUT CONTRAST  TECHNIQUE: Contiguous axial images were obtained from the base of the skull through the vertex without intravenous contrast.  COMPARISON:  Head CT and brain MRI 09/16/2013  FINDINGS: No intracranial hemorrhage, mass effect, or midline shift. No hydrocephalus. The basilar cisterns are patent. No evidence of territorial infarct. No intracranial fluid collection. Calvarium is intact. Included paranasal sinuses and mastoid air cells are well aerated.  IMPRESSION: No acute intracranial abnormality.   Electronically Signed   By: Jeb Levering M.D.   On: 03/31/2015 21:11   I have personally reviewed and evaluated these images and lab results as part of my medical decision-making.   EKG Interpretation   Date/Time:  Thursday March 31 2015 20:46:32 EDT Ventricular Rate:  70 PR Interval:  169 QRS Duration: 87 QT Interval:  389 QTC Calculation: 420 R Axis:   9 Text Interpretation:  Sinus rhythm Low voltage, precordial leads Abnormal  R-wave progression, early transition Confirmed by Deztiny Sarra  MD, Taleshia Luff  ([id number]) on 03/31/2015 9:14:34 PM      MDM   Final diagnoses:  None    Patient given pain medicine and feels better Patient has not been compliant with her antihypertensives medications. No evidence of CVA at this time. Pain appears to be musculoskeletal in etiology. Repeat exam after medications shows that she has normal neurological function on the left side. She is stable for discharge   Lacretia Leigh, MD 03/31/15 2242

## 2015-03-31 NOTE — ED Notes (Signed)
Pt being taken to CT with RN

## 2015-03-31 NOTE — ED Notes (Signed)
In ct with pt.

## 2015-03-31 NOTE — ED Notes (Signed)
Pt verbalized understanding of d/c instructions and has no further questions. Pt stable and NAD.  

## 2015-03-31 NOTE — Discharge Instructions (Signed)
Follow-up in the clinic about your increased blood pressure  Hypertension Hypertension, commonly called high blood pressure, is when the force of blood pumping through your arteries is too strong. Your arteries are the blood vessels that carry blood from your heart throughout your body. A blood pressure reading consists of a higher number over a lower number, such as 110/72. The higher number (systolic) is the pressure inside your arteries when your heart pumps. The lower number (diastolic) is the pressure inside your arteries when your heart relaxes. Ideally you want your blood pressure below 120/80. Hypertension forces your heart to work harder to pump blood. Your arteries may become narrow or stiff. Having hypertension puts you at risk for heart disease, stroke, and other problems.  RISK FACTORS Some risk factors for high blood pressure are controllable. Others are not.  Risk factors you cannot control include:   Race. You may be at higher risk if you are African American.  Age. Risk increases with age.  Gender. Men are at higher risk than women before age 53 years. After age 47, women are at higher risk than men. Risk factors you can control include:  Not getting enough exercise or physical activity.  Being overweight.  Getting too much fat, sugar, calories, or salt in your diet.  Drinking too much alcohol. SIGNS AND SYMPTOMS Hypertension does not usually cause signs or symptoms. Extremely high blood pressure (hypertensive crisis) may cause headache, anxiety, shortness of breath, and nosebleed. DIAGNOSIS  To check if you have hypertension, your health care provider will measure your blood pressure while you are seated, with your arm held at the level of your heart. It should be measured at least twice using the same arm. Certain conditions can cause a difference in blood pressure between your right and left arms. A blood pressure reading that is higher than normal on one occasion does  not mean that you need treatment. If one blood pressure reading is high, ask your health care provider about having it checked again. TREATMENT  Treating high blood pressure includes making lifestyle changes and possibly taking medicine. Living a healthy lifestyle can help lower high blood pressure. You may need to change some of your habits. Lifestyle changes may include:  Following the DASH diet. This diet is high in fruits, vegetables, and whole grains. It is low in salt, red meat, and added sugars.  Getting at least 2 hours of brisk physical activity every week.  Losing weight if necessary.  Not smoking.  Limiting alcoholic beverages.  Learning ways to reduce stress. If lifestyle changes are not enough to get your blood pressure under control, your health care provider may prescribe medicine. You may need to take more than one. Work closely with your health care provider to understand the risks and benefits. HOME CARE INSTRUCTIONS  Have your blood pressure rechecked as directed by your health care provider.   Take medicines only as directed by your health care provider. Follow the directions carefully. Blood pressure medicines must be taken as prescribed. The medicine does not work as well when you skip doses. Skipping doses also puts you at risk for problems.   Do not smoke.   Monitor your blood pressure at home as directed by your health care provider. SEEK MEDICAL CARE IF:   You think you are having a reaction to medicines taken.  You have recurrent headaches or feel dizzy.  You have swelling in your ankles.  You have trouble with your vision. [place]  CARE IF:  You develop a severe headache or confusion.  You have unusual weakness, numbness, or feel faint.  You have severe chest or abdominal pain.  You vomit repeatedly.  You have trouble breathing. MAKE SURE YOU:   Understand these instructions.  Will watch your condition.  Will get help  right away if you are not doing well or get worse. Document Released: 07/23/2005 Document Revised: 12/07/2013 Document Reviewed: 05/15/2013 [facility] Patient Information 2015 [place], [place]. This information is not intended to replace advice given to you by your health care provider. Make sure you discuss any questions you have with your health care provider. Shoulder Pain The shoulder is the joint that connects your arms to your body. The bones that form the shoulder joint include the upper arm bone (humerus), the shoulder blade (scapula), and the collarbone (clavicle). The top of the humerus is shaped like a ball and fits into a rather flat socket on the scapula (glenoid cavity). A combination of muscles and strong, fibrous tissues that connect muscles to bones (tendons) support your shoulder joint and hold the ball in the socket. Small, fluid-filled sacs (bursae) are located in different areas of the joint. They act as cushions between the bones and the overlying soft tissues and help reduce friction between the gliding tendons and the bone as you move your arm. Your shoulder joint allows a wide range of motion in your arm. This range of motion allows you to do things like scratch your back or throw a ball. However, this range of motion also makes your shoulder more prone to pain from overuse and injury. Causes of shoulder pain can originate from both injury and overuse and usually can be grouped in the following four categories:  Redness, swelling, and pain (inflammation) of the tendon (tendinitis) or the bursae (bursitis).  Instability, such as a dislocation of the joint.  Inflammation of the joint (arthritis).  Broken bone (fracture). HOME CARE INSTRUCTIONS   Apply ice to the sore area.  Put ice in a plastic bag.  Place a towel between your skin and the bag.  Leave the ice on for 15-20 minutes, 3-4 times per day for the first 2 days, or as directed by your health care provider.  Stop  using cold packs if they do not help with the pain.  If you have a shoulder sling or immobilizer, wear it as long as your caregiver instructs. Only remove it to shower or bathe. Move your arm as little as possible, but keep your hand moving to prevent swelling.  Squeeze a soft ball or foam pad as much as possible to help prevent swelling.  Only take over-the-counter or prescription medicines for pain, discomfort, or fever as directed by your caregiver. SEEK MEDICAL CARE IF:   Your shoulder pain increases, or new pain develops in your arm, hand, or fingers.  Your hand or fingers become cold and numb.  Your pain is not relieved with medicines. SEEK IMMEDIATE MEDICAL CARE IF:   Your arm, hand, or fingers are numb or tingling.  Your arm, hand, or fingers are significantly swollen or turn white or blue. MAKE SURE YOU:   Understand these instructions.  Will watch your condition.  Will get help right away if you are not doing well or get worse. Document Released: 05/02/2005 Document Revised: 12/07/2013 Document Reviewed: 07/07/2011 [facility] Patient Information 2015 [place], [place]. This information is not intended to replace advice given to you by your health care provider. Make sure you discuss  any questions you have with your health care provider. ° °

## 2015-03-31 NOTE — ED Notes (Signed)
Back in room with pt.

## 2015-03-31 NOTE — ED Notes (Signed)
Pt. reports left arm weakness with numbness onset 12:30 pm today , denies injury , speech clear with no facial asymmetry , ambulatory . Denies pain / respirations unlabored .

## 2015-04-08 ENCOUNTER — Encounter: Payer: Self-pay | Admitting: Family Medicine

## 2015-04-08 ENCOUNTER — Ambulatory Visit: Payer: Self-pay | Attending: Family Medicine | Admitting: Family Medicine

## 2015-04-08 VITALS — BP 151/105 | HR 68 | Temp 98.0°F | Ht 64.0 in | Wt 233.0 lb

## 2015-04-08 DIAGNOSIS — M25511 Pain in right shoulder: Secondary | ICD-10-CM | POA: Insufficient documentation

## 2015-04-08 DIAGNOSIS — Z79899 Other long term (current) drug therapy: Secondary | ICD-10-CM | POA: Insufficient documentation

## 2015-04-08 DIAGNOSIS — R51 Headache: Secondary | ICD-10-CM | POA: Insufficient documentation

## 2015-04-08 DIAGNOSIS — R531 Weakness: Secondary | ICD-10-CM | POA: Insufficient documentation

## 2015-04-08 DIAGNOSIS — I10 Essential (primary) hypertension: Secondary | ICD-10-CM | POA: Insufficient documentation

## 2015-04-08 DIAGNOSIS — Z87891 Personal history of nicotine dependence: Secondary | ICD-10-CM | POA: Insufficient documentation

## 2015-04-08 DIAGNOSIS — M25512 Pain in left shoulder: Secondary | ICD-10-CM | POA: Insufficient documentation

## 2015-04-08 DIAGNOSIS — J0120 Acute ethmoidal sinusitis, unspecified: Secondary | ICD-10-CM | POA: Insufficient documentation

## 2015-04-08 MED ORDER — PREDNISONE 20 MG PO TABS: 20.0000 mg | ORAL_TABLET | Freq: Every day | ORAL | Status: DC

## 2015-04-08 MED ORDER — LISINOPRIL-HYDROCHLOROTHIAZIDE 10-12.5 MG PO TABS: 1.0000 | ORAL_TABLET | Freq: Every day | ORAL | Status: DC

## 2015-04-08 NOTE — Progress Notes (Signed)
Patient has been having left shoulder pain 4/10 since Friday Ed completed XRAY in ED Patient states she is having trouble sleeping at night

## 2015-04-08 NOTE — Progress Notes (Signed)
Subjective:  Patient ID: Cathy [name], female    DOB: 1960/12/26  Age: 54 y.o. MRN: [id number]  CC: Shoulder Pain   HPI Cathy [name] presents for left shoulder pain and left arm weakness which started a week ago after she had been assisting her grandson ride a bicycle; there was no history of trauma or heavy lifting. She had presented to the ED with this symptom as well as reduced hand grip and had a CT head which was negative for acute intracranial abnormality. She was placed on Valium and Percocet after which she was discharged.  She denies numbness in any part of her body or speech impairment; she does have a history of hypertension but has not been on medications for a while because she has been working on lifestyle changes and has lost about 60 pounds in the last 3 years. She endorses some nasal and sinus congestion but denies any fever, shortness of breath or chest pains.  Past Medical History  Diagnosis Date  . Back pain   . Hypertension     Past Surgical History  Procedure Laterality Date  . Back surgery    . Knee surgery      Social History   Social History  . Marital Status: Divorced    Spouse Name: N/A  . Number of Children: N/A  . Years of Education: N/A   Occupational History  . Not on file.   Social History Main Topics  . Smoking status: Former Research scientist (life sciences)  . Smokeless tobacco: Not on file  . Alcohol Use: No  . Drug Use: No  . Sexual Activity: Not on file   Other Topics Concern  . Not on file   Social History Narrative    No Known Allergies     Outpatient Prescriptions Prior to Visit  Medication Sig Dispense Refill  . diazepam (VALIUM) 2 MG tablet Take 1 tablet (2 mg total) by mouth every 4 (four) hours as needed for muscle spasms. (Patient not taking: Reported on 04/08/2015) 12 tablet 0  . oxyCODONE-acetaminophen (PERCOCET/ROXICET) 5-325 MG per tablet Take 1-2 tablets by mouth every 4 (four) hours as needed for moderate pain or severe pain.  (Patient not taking: Reported on 04/08/2015) 10 tablet 0   No facility-administered medications prior to visit.    ROS Review of Systems  Constitutional: Negative for activity change and appetite change.  HENT: Positive for postnasal drip, rhinorrhea and sinus pressure. Negative for sore throat.   Respiratory: Negative for chest tightness, shortness of breath and wheezing.   Gastrointestinal: Negative for abdominal pain, constipation and abdominal distention.  Genitourinary: Negative.   Musculoskeletal:       See history of present illness  Psychiatric/Behavioral: Negative for behavioral problems and dysphoric mood.       Objective:  BP 151/105 mmHg  Pulse 68  Temp(Src) 98 F (36.7 C)  Ht 5\' 4"  (1.626 m)  Wt 233 lb (105.688 kg)  BMI 39.97 kg/m2  SpO2 97%  BP/Weight 04/08/2015 03/31/2015 4/70/9628  Systolic BP 366 294 765  Diastolic BP 465 80 73  Wt. (Lbs) 233 233.38 235  BMI 39.97 40.04 41.64      Physical Exam  Constitutional: She is oriented to person, place, and time. She appears well-developed and well-nourished.  HENT:  Right Ear: External ear normal.  Left Ear: External ear normal.  Mouth/Throat: Oropharynx is clear and moist.  Cardiovascular: Normal rate, normal heart sounds and intact distal pulses.   No murmur heard. Pulmonary/Chest: Effort normal and  breath sounds normal. She has no wheezes. She has no rales. She exhibits no tenderness.  Abdominal: Soft. Bowel sounds are normal. She exhibits no distension and no mass. There is no tenderness.  Musculoskeletal:  Left arm: Tenderness on palpation of the anterior shoulder joint, forward elevation limited to 70, reduced hand grip. Right arm: Normal range of motion, normal grip, normal range of motion  Neurological: She is alert and oriented to person, place, and time.       CLINICAL DATA: Left-sided weakness and headache. Left arm weakness and numbness, onset 12:30 p.m. today, 8 hours prior.  EXAM: CT  HEAD WITHOUT CONTRAST  TECHNIQUE: Contiguous axial images were obtained from the base of the skull through the vertex without intravenous contrast.  COMPARISON: Head CT and brain MRI 09/16/2013  FINDINGS: No intracranial hemorrhage, mass effect, or midline shift. No hydrocephalus. The basilar cisterns are patent. No evidence of territorial infarct. No intracranial fluid collection. Calvarium is intact. Included paranasal sinuses and mastoid air cells are well aerated.  IMPRESSION: No acute intracranial abnormality.   Electronically Signed  By: Jeb Levering M.D.  On: 03/31/2015 21:11  Assessment & Plan:  54 year old female patient with history of hypertension (not currently on antihypertensives), a recent ED visit for left shoulder pain here to establish care.  1. Essential hypertension Uncontrolled. Commenced on lisinopril/HCTZ. Low-sodium, DASH diet.  2. Left shoulder pain CVA ruled out with negative brain imaging; advised to take ASA 81mg  in the event that a TIA is not impossible Possible nerve impingement. Placed on short course prednisone Continue muscle relaxant and analgesic received from the ED and I will reassess for improvement at her next visit.  3. Acute ethmoidal sinusitis, recurrence not specified Advised to use antihistamines, increase fluid intake, use a note just 6 and rest. Will hold off on antibiotics at this time.   Meds ordered this encounter  Medications  . predniSONE (DELTASONE) 20 MG tablet    Sig: Take 1 tablet (20 mg total) by mouth daily with breakfast.    Dispense:  5 tablet    Refill:  0  . lisinopril-hydrochlorothiazide (PRINZIDE,ZESTORETIC) 10-12.5 MG per tablet    Sig: Take 1 tablet by mouth daily.    Dispense:  30 tablet    Refill:  2    Follow-up: Return in about 2 weeks (around 04/22/2015) for follow up of L shoulder pain with Dr Jarold Song.   Arnoldo Morale MD

## 2015-04-12 ENCOUNTER — Ambulatory Visit: Payer: Self-pay | Attending: Internal Medicine

## 2015-04-18 ENCOUNTER — Ambulatory Visit: Payer: Self-pay

## 2015-04-22 ENCOUNTER — Ambulatory Visit: Payer: Self-pay | Admitting: Family Medicine

## 2016-09-02 ENCOUNTER — Encounter (HOSPITAL_COMMUNITY): Payer: Self-pay | Admitting: Emergency Medicine

## 2016-09-02 ENCOUNTER — Emergency Department (HOSPITAL_COMMUNITY): Payer: Medicare Other

## 2016-09-02 ENCOUNTER — Emergency Department (HOSPITAL_COMMUNITY)
Admission: EM | Admit: 2016-09-02 | Discharge: 2016-09-02 | Disposition: A | Discharge status: Home / Self Care | Payer: Medicare Other | Attending: Emergency Medicine | Admitting: Emergency Medicine

## 2016-09-02 DIAGNOSIS — Z79899 Other long term (current) drug therapy: Secondary | ICD-10-CM | POA: Insufficient documentation

## 2016-09-02 DIAGNOSIS — M25571 Pain in right ankle and joints of right foot: Secondary | ICD-10-CM | POA: Diagnosis present

## 2016-09-02 DIAGNOSIS — M7731 Calcaneal spur, right foot: Secondary | ICD-10-CM | POA: Insufficient documentation

## 2016-09-02 DIAGNOSIS — I10 Essential (primary) hypertension: Secondary | ICD-10-CM | POA: Insufficient documentation

## 2016-09-02 MED ORDER — IBUPROFEN 800 MG PO TABS
800.0000 mg | ORAL_TABLET | Freq: Once | ORAL | Status: AC
  Administered 2016-09-02: 800 mg via ORAL
  Filled 2016-09-02: qty 1

## 2016-09-02 NOTE — ED Provider Notes (Signed)
[facility] DEPT Provider Note   CSN: [id number] Arrival date & time: 09/02/16  0400     History   Chief Complaint Chief Complaint  Patient presents with  . Ankle Pain    HPI Antonise Lewin is a 56 y.o. female.  The history is provided by the patient and a significant other.  Ankle Pain   The incident occurred more than 1 week ago. There was no injury mechanism. The pain is present in the right ankle, right heel and right foot. The quality of the pain is described as aching. The pain is moderate. The pain has been constant since onset. Associated symptoms include inability to bear weight. The symptoms are aggravated by activity and bearing weight. She has tried rest for the symptoms.  PT REPORTS RIGHT FOOT/ANKLE/HEEL/ACHILLES PAIN FOR 1-2 WEEKS WORSE WITH AMBULATION NO TRAUMA/FALLS NO NEW LE EDEMA NO CALF TENDERNESS NO H/O DVT   Past Medical History:  Diagnosis Date  . Back pain   . Hypertension     Patient Active Problem List   Diagnosis Date Noted  . Hypertension 04/08/2015  . Left shoulder pain 04/08/2015  . HYPOPOTASSEMIA 03/14/2009  . 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 BACK SURGERY    . KNEE SURGERY      OB History    No data available       Home Medications    Prior to Admission medications   Medication Sig Start Date End Date Taking? Authorizing Provider  diazepam (VALIUM) 2 MG tablet Take 1 tablet (2 mg total) by mouth every 4 (four) hours as needed for muscle spasms. Patient not taking: Reported on 04/08/2015 03/31/15   Lacretia Leigh, MD  lisinopril-hydrochlorothiazide (PRINZIDE,ZESTORETIC) 10-12.5 MG per tablet Take 1 tablet by mouth daily. 04/08/15   Arnoldo Morale, MD  oxyCODONE-acetaminophen (PERCOCET/ROXICET) 5-325 MG per tablet Take 1-2 tablets by mouth every 4 (four) hours as needed for moderate pain or severe pain. Patient not taking: Reported on 04/08/2015 03/31/15    Lacretia Leigh, MD  predniSONE (DELTASONE) 20 MG tablet Take 1 tablet (20 mg total) by mouth daily with breakfast. 04/08/15   Arnoldo Morale, MD    Family History Family History  Problem Relation Age of Onset  . Stroke Mother     Social History Social History  Substance Use Topics  . Smoking status: Former Research scientist (life sciences)  . Smokeless tobacco: Not on file  . Alcohol use No     Allergies   Patient has no known allergies.   Review of Systems Review of Systems  Constitutional: Negative for fever.  Respiratory: Negative for shortness of breath.   Cardiovascular: Negative for chest pain.  Musculoskeletal: Positive for arthralgias. Negative for joint swelling.  Skin: Negative for color change.     Physical Exam Updated Vital Signs BP (!) 166/109   Pulse (!) 53   Temp 98.3 F (36.8 C) (Oral)   Resp 18   Ht 5\' 2"  (1.575 m)   Wt 113.4 kg   SpO2 99%   BMI 45.73 kg/m   Physical Exam  CONSTITUTIONAL: Well developed/well nourished HEAD: Normocephalic/atraumatic EYES: EOMI ENMT: Mucous membranes moist NECK: supple no meningeal signs CV: S1/S2 noted, no murmurs/rubs/gallops noted LUNGS: Lungs are clear to auscultation bilaterally, no apparent distress ABDOMEN: soft, nontender NEURO: Pt is awake/alert/appropriate, moves all extremitiesx4.  No facial droop.   EXTREMITIES: pulses normal/equal, full ROM.  Mild tenderness to palpation of right achilles, right plantar  surface of foot and also bilateral malleoli.  No calf tenderness or edema No evidence of achilles rupture. Distal pulses intact Right foot is warm to touch, no crepitus, no right ankle joint edema/erythema SKIN: warm, color normal PSYCH: no abnormalities of mood noted, alert and oriented to situation  ED Treatments / Results  Labs (all labs ordered are listed, but only abnormal results are displayed) Labs Reviewed - No data to display  EKG  EKG Interpretation None       Radiology Dg Ankle Complete  Right  Result Date: 09/02/2016 CLINICAL DATA:  56 year old female with right ankle pain x2 weeks. No known injury. EXAM: RIGHT ANKLE - COMPLETE 3+ VIEW COMPARISON:  Right foot radiograph dated 03/25/2008 FINDINGS: There is no acute fracture or dislocation. The bones are well mineralized. No arthritic changes. There is a 9 mm calcaneal spur. Small amorphous soft tissue calcification in the anterior shin likely related to prior trauma. Mild diffuse subcutaneous edema. The soft tissues are otherwise unremarkable. IMPRESSION: No acute fracture or dislocation. Electronically Signed   By: Anner Crete M.D.   On: 09/02/2016 05:51    Procedures Procedures (including critical care time)  Medications Ordered in ED Medications  ibuprofen (ADVIL,MOTRIN) tablet 800 mg (800 mg Oral Given 09/02/16 [id number])     Initial Impression / Assessment and Plan / ED Course  I have reviewed the triage vital signs and the nursing notes.  Pertinent  imaging results that were available during my care of the patient were reviewed by me and considered in my medical decision making (see chart for details).     Pt with spur noted on xray which could contribute to pain No bony injury No signs of septic joint No signs of DVT She requests cam walker boot Referred to podiatry   Final Clinical Impressions(s) / ED Diagnoses   Final diagnoses:  Calcaneal spur of foot, right    New Prescriptions New Prescriptions   No medications on file     Ripley Fraise, MD 09/02/16 [phone]

## 2016-09-02 NOTE — ED Notes (Signed)
Ortho paged again 

## 2016-09-02 NOTE — ED Triage Notes (Signed)
Pt states she is been having right ankle pain for a week getting worse today make it more difficult to walk. 10/10 pain. Denies any fall or injury.

## 2016-09-02 NOTE — Progress Notes (Signed)
Orthopedic Tech Progress Note Patient Details:  Cathy [name] 1961-02-03 [id number]  Ortho Devices Type of Ortho Device: CAM walker Ortho Device/Splint Interventions: Application   [name] 09/02/2016, 7:51 AM

## 2016-09-02 NOTE — ED Notes (Signed)
ED Provider at bedside. 

## 2016-09-02 NOTE — ED Notes (Signed)
Patient transported to X-ray 

## 2016-09-02 NOTE — ED Notes (Signed)
Ortho paged for cam walker

## 2016-09-24 ENCOUNTER — Ambulatory Visit (INDEPENDENT_AMBULATORY_CARE_PROVIDER_SITE_OTHER): Payer: Medicare Other | Admitting: Podiatry

## 2016-09-24 ENCOUNTER — Encounter: Payer: Self-pay | Admitting: Podiatry

## 2016-09-24 DIAGNOSIS — M2142 Flat foot [pes planus] (acquired), left foot: Secondary | ICD-10-CM

## 2016-09-24 DIAGNOSIS — M2141 Flat foot [pes planus] (acquired), right foot: Secondary | ICD-10-CM

## 2016-09-24 DIAGNOSIS — M722 Plantar fascial fibromatosis: Secondary | ICD-10-CM | POA: Diagnosis not present

## 2016-09-24 DIAGNOSIS — M79671 Pain in right foot: Secondary | ICD-10-CM

## 2016-09-24 DIAGNOSIS — M76821 Posterior tibial tendinitis, right leg: Secondary | ICD-10-CM

## 2016-09-24 MED ORDER — MELOXICAM 15 MG PO TABS: 15.0000 mg | ORAL_TABLET | Freq: Every day | ORAL | 1 refills | Status: AC

## 2016-09-30 MED ORDER — BETAMETHASONE SOD PHOS & ACET 6 (3-3) MG/ML IJ SUSP: 3.0000 mg | Freq: Once | INTRAMUSCULAR | Status: DC

## 2016-09-30 NOTE — Progress Notes (Signed)
   Subjective: Patient presents today as a new patient for right heel pain is been going on for approximately 3 months now. X-rays were taken in the emergency department on 09/02/2016. Patient denies trauma however she states that she has severe pain and tenderness to the right lower extremity. Patient presents today for further treatment and evaluation  Objective: Physical Exam General: The patient is alert and oriented x3 in no acute distress.  Dermatology: Skin is warm, dry and supple bilateral lower extremities. Negative for open lesions or macerations bilateral.   Vascular: Dorsalis Pedis and Posterior Tibial pulses palpable bilateral.  Capillary fill time is immediate to all digits.  Neurological: Epicritic and protective threshold intact bilateral.   Musculoskeletal: Tenderness to palpation at the medial calcaneal tubercale and through the insertion of the plantar fascia of the right foot.  Pain on palpation also noted to the posterior tibial tendon right lower extremity posterior to the medial malleolus consistent with a posterior tibial tendinitis. All other joints range of motion within normal limits bilateral. Strength 5/5 in all groups bilateral.   Radiographic exam: Normal osseous mineralization. Joint spaces preserved. No fracture/dislocation/boney destruction. Calcaneal spur present with mild thickening of plantar fascia right. No other soft tissue abnormalities or radiopaque foreign bodies.   Assessment: 1. Plantar fasciitis right 2. Posterior tibial tendinitis right 3. Posterior tibial tendon dysfunction (PTTD) right 4. Pes planus deformity  Plan of Care:  1. Patient evaluated. Xrays reviewed.   2. Injection of 0.5cc Celestone soluspan injected into the right heel at the insertion of the plantar fascia.  3. Injection of 0.5 mL Celestone Soluspan injected into the posterior tibial tendon sheath right lower extremity 4. Prescription for meloxicam 15 mg 5. Recommend  over-the-counter arch supports at [employer] to support the arches of the foot. 6. Return to clinic in 4 weeks  Edrick Kins, DPM [facility]  Dr. Edrick Kins, [address]                                        [place], [place] [address]                Office [phone]  Fax [phone]

## 2016-10-22 ENCOUNTER — Ambulatory Visit (INDEPENDENT_AMBULATORY_CARE_PROVIDER_SITE_OTHER): Payer: Medicare Other | Admitting: Podiatry

## 2016-10-22 DIAGNOSIS — M2141 Flat foot [pes planus] (acquired), right foot: Secondary | ICD-10-CM

## 2016-10-22 DIAGNOSIS — M76821 Posterior tibial tendinitis, right leg: Secondary | ICD-10-CM | POA: Diagnosis not present

## 2016-10-22 DIAGNOSIS — M722 Plantar fascial fibromatosis: Secondary | ICD-10-CM | POA: Diagnosis not present

## 2016-10-22 MED ORDER — BETAMETHASONE SOD PHOS & ACET 6 (3-3) MG/ML IJ SUSP: 3.0000 mg | Freq: Once | INTRAMUSCULAR | Status: DC

## 2016-10-22 NOTE — Progress Notes (Signed)
   Subjective: Patient presents today for follow-up treatment of plantar fasciitis to the right foot as well as posterior tibial tendinitis right. Patient states that she was doing a lot better however she noticed some pain over the weekend she thinks she overused her right foot. Patient does have a new pair shoes and she states that they do help.  Objective: Physical Exam General: The patient is alert and oriented x3 in no acute distress.  Dermatology: Skin is warm, dry and supple bilateral lower extremities. Negative for open lesions or macerations bilateral.   Vascular: Dorsalis Pedis and Posterior Tibial pulses palpable bilateral.  Capillary fill time is immediate to all digits.  Neurological: Epicritic and protective threshold intact bilateral.   Musculoskeletal: Tenderness to palpation at the medial calcaneal tubercale and through the insertion of the plantar fascia of the right foot.  Pain on palpation also noted to the posterior tibial tendon right lower extremity posterior to the medial malleolus consistent with a posterior tibial tendinitis. All other joints range of motion within normal limits bilateral. Strength 5/5 in all groups bilateral.   Radiographic exam: Normal osseous mineralization. Joint spaces preserved. No fracture/dislocation/boney destruction. Calcaneal spur present with mild thickening of plantar fascia right. No other soft tissue abnormalities or radiopaque foreign bodies.   Assessment: 1. Plantar fasciitis right 2. Posterior tibial tendinitis right 3. Posterior tibial tendon dysfunction (PTTD) right 4. Pes planus deformity  Plan of Care:  1. Patient evaluated. Xrays reviewed.   2. Injection of 0.5cc Celestone soluspan injected into the right heel at the insertion of the plantar fascia.  3. Injection of 0.5 mL Celestone Soluspan injected into the posterior tibial tendon sheath right lower extremity 4.Continue meloxicam 15 mg  5.Today plantar fascial brace  was dispensed 6.  custom molded orthotics are not covered by insurance 7. Return to clinic in 4 weeks  Edrick Kins, DPM [facility]  Dr. Edrick Kins, [address]                                        [place], [place] [address]                Office [phone]  Fax [phone]

## 2016-11-19 ENCOUNTER — Ambulatory Visit: Payer: Medicare Other | Admitting: Podiatry

## 2016-11-28 ENCOUNTER — Ambulatory Visit (INDEPENDENT_AMBULATORY_CARE_PROVIDER_SITE_OTHER): Payer: Medicare Other | Admitting: Podiatry

## 2016-11-28 DIAGNOSIS — M722 Plantar fascial fibromatosis: Secondary | ICD-10-CM

## 2016-11-28 DIAGNOSIS — M76821 Posterior tibial tendinitis, right leg: Secondary | ICD-10-CM

## 2016-11-30 NOTE — Progress Notes (Signed)
   Subjective: Patient presents today for follow-up evaluation and treatment of plantar fasciitis to the right foot as well as posterior tibial tendinitis right. Patient states that she wants another injection and states it provided relief for 2-3 weeks previously. She has taken meloxicam with no significant relief.  Objective: Physical Exam General: The patient is alert and oriented x3 in no acute distress.  Dermatology: Skin is warm, dry and supple bilateral lower extremities. Negative for open lesions or macerations bilateral.   Vascular: Dorsalis Pedis and Posterior Tibial pulses palpable bilateral.  Capillary fill time is immediate to all digits.  Neurological: Epicritic and protective threshold intact bilateral.   Musculoskeletal: Tenderness to palpation at the medial calcaneal tubercale and through the insertion of the plantar fascia of the right foot.  Pain on palpation also noted to the posterior tibial tendon right lower extremity posterior to the medial malleolus consistent with a posterior tibial tendinitis. All other joints range of motion within normal limits bilateral. Strength 5/5 in all groups bilateral.    Assessment: 1. Plantar fasciitis right 2. Posterior tibial tendinitis right 3. Posterior tibial tendon dysfunction (PTTD) right 4. Pes planus deformity  Plan of Care:  1. Patient evaluated. Xrays reviewed.   2. Injection of 0.5cc Celestone soluspan injected into the right heel at the insertion of the plantar fascia.  3. Injection of 0.5 mL Celestone Soluspan injected into the posterior tibial tendon sheath right lower extremity 4.Continue meloxicam 15 mg  5.Continue wearing plantar fascial brace 6. Going to get insoles from the [employer] 7. Return to clinic in 4 weeks  Edrick Kins, DPM [facility]  Dr. Edrick Kins, [address]                                        [place], [place] [address]                Office [phone]  Fax [phone]

## 2016-12-02 MED ORDER — BETAMETHASONE SOD PHOS & ACET 6 (3-3) MG/ML IJ SUSP: 3.0000 mg | Freq: Once | INTRAMUSCULAR | Status: DC

## 2016-12-14 ENCOUNTER — Emergency Department (HOSPITAL_COMMUNITY)
Admission: EM | Admit: 2016-12-14 | Discharge: 2016-12-14 | Disposition: A | Discharge status: Home / Self Care | Payer: Medicare Other | Attending: Emergency Medicine | Admitting: Emergency Medicine

## 2016-12-14 ENCOUNTER — Encounter (HOSPITAL_COMMUNITY): Payer: Self-pay

## 2016-12-14 DIAGNOSIS — Y999 Unspecified external cause status: Secondary | ICD-10-CM | POA: Diagnosis not present

## 2016-12-14 DIAGNOSIS — I10 Essential (primary) hypertension: Secondary | ICD-10-CM | POA: Insufficient documentation

## 2016-12-14 DIAGNOSIS — Y939 Activity, unspecified: Secondary | ICD-10-CM | POA: Insufficient documentation

## 2016-12-14 DIAGNOSIS — Y929 Unspecified place or not applicable: Secondary | ICD-10-CM | POA: Diagnosis not present

## 2016-12-14 DIAGNOSIS — S79922A Unspecified injury of left thigh, initial encounter: Secondary | ICD-10-CM | POA: Diagnosis present

## 2016-12-14 DIAGNOSIS — S76319A Strain of muscle, fascia and tendon of the posterior muscle group at thigh level, unspecified thigh, initial encounter: Secondary | ICD-10-CM

## 2016-12-14 DIAGNOSIS — S76312A Strain of muscle, fascia and tendon of the posterior muscle group at thigh level, left thigh, initial encounter: Secondary | ICD-10-CM | POA: Diagnosis not present

## 2016-12-14 DIAGNOSIS — X58XXXA Exposure to other specified factors, initial encounter: Secondary | ICD-10-CM | POA: Diagnosis not present

## 2016-12-14 DIAGNOSIS — Z87891 Personal history of nicotine dependence: Secondary | ICD-10-CM | POA: Insufficient documentation

## 2016-12-14 MED ORDER — OXYCODONE HCL 5 MG PO TABS
5.0000 mg | ORAL_TABLET | Freq: Once | ORAL | Status: DC
Start: 2016-12-14 — End: 2016-12-15

## 2016-12-14 MED ORDER — ACETAMINOPHEN 500 MG PO TABS: 1000.0000 mg | ORAL_TABLET | Freq: Once | ORAL | Status: DC

## 2016-12-14 MED ORDER — IBUPROFEN 800 MG PO TABS: 800.0000 mg | ORAL_TABLET | Freq: Once | ORAL | Status: DC

## 2016-12-14 NOTE — ED Triage Notes (Signed)
Pt reports right leg pain behind her knee and into her thigh and right hip. Pt reports she is unable to completely extend the leg.

## 2016-12-14 NOTE — Progress Notes (Signed)
Orthopedic Tech Progress Note Patient Details:  Cathy [name] Sep 18, 1960 [id number]  Ortho Devices Type of Ortho Device: Crutches, Knee Sleeve Ortho Device/Splint Interventions: Application   [name] 12/14/2016, 11:43 PM

## 2016-12-14 NOTE — ED Notes (Signed)
Patient refused pain medication prescribed. Spouse states "I ain't paying $800 for a pill. She's got that at home." No pain medication given.

## 2016-12-14 NOTE — Discharge Instructions (Signed)
Take 4 over the counter ibuprofen tablets 3 times a day or 2 over-the-counter naproxen tablets twice a day for pain. Also take tylenol 1000mg(2 extra strength) four times a day.    

## 2016-12-14 NOTE — ED Notes (Signed)
ED provider at bedside.

## 2016-12-14 NOTE — ED Provider Notes (Signed)
[facility] DEPT Provider Note   CSN: [id number] Arrival date & time: 12/14/16  7371  By signing my name below, I, Jeanell Sparrow, attest that this documentation has been prepared under the direction and in the presence of Deno Etienne, DO. Electronically Signed: Jeanell Sparrow, Scribe. 12/14/2016. 11:13 PM.  History   Chief Complaint Chief Complaint  Patient presents with  . Leg Pain   The history is provided by the patient. No language interpreter was used.  Leg Pain   This is a new problem. The current episode started more than 2 days ago. The problem occurs constantly. The problem has been gradually worsening. The pain is present in the left lower leg. The pain is at a severity of 5/10. The pain is moderate. Pertinent negatives include no numbness. She has tried nothing for the symptoms. The treatment provided no relief. There has been no history of extremity trauma.   HPI Comments: Cathy [name] is a 56 y.o. female who presents to the Emergency Department complaining of constant moderate RLE pain that started about 3 days ago. She states her pain worsened today with onset of associated lower leg swelling. No recent trauma. Her pain is exacerbated by palpation and RLE movement. Denies any chest pain, SOB, LOC, or other complaints at this time.  Past Medical History:  Diagnosis Date  . Back pain   . Hypertension     Patient Active Problem List   Diagnosis Date Noted  . Hypertension 04/08/2015  . Left shoulder pain 04/08/2015  . HYPOPOTASSEMIA 03/14/2009  . 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 BACK SURGERY    . KNEE SURGERY      OB History    No data available       Home Medications    Prior to Admission medications   Medication Sig Start Date End Date Taking? Authorizing Provider  diazepam (VALIUM) 2 MG tablet Take 1 tablet (2 mg total) by mouth every 4 (four) hours as needed for muscle  spasms. Patient not taking: Reported on 04/08/2015 03/31/15   Lacretia Leigh, MD  lisinopril-hydrochlorothiazide (PRINZIDE,ZESTORETIC) 10-12.5 MG per tablet Take 1 tablet by mouth daily. Patient not taking: Reported on 09/24/2016 04/08/15   Arnoldo Morale, MD  oxyCODONE-acetaminophen (PERCOCET/ROXICET) 5-325 MG per tablet Take 1-2 tablets by mouth every 4 (four) hours as needed for moderate pain or severe pain. Patient not taking: Reported on 04/08/2015 03/31/15   Lacretia Leigh, MD  predniSONE (DELTASONE) 20 MG tablet Take 1 tablet (20 mg total) by mouth daily with breakfast. Patient not taking: Reported on 09/24/2016 04/08/15   Arnoldo Morale, MD    Family History Family History  Problem Relation Age of Onset  . Stroke Mother     Social History Social History  Substance Use Topics  . Smoking status: Former Research scientist (life sciences)  . Smokeless tobacco: Never Used  . Alcohol use No     Allergies   Patient has no known allergies.   Review of Systems Review of Systems  Constitutional: Negative for chills and fever.  HENT: Negative for congestion and rhinorrhea.   Eyes: Negative for redness and visual disturbance.  Respiratory: Negative for shortness of breath and wheezing.   Cardiovascular: Negative for chest pain and palpitations.  Gastrointestinal: Negative for nausea and vomiting.  Genitourinary: Negative for dysuria and urgency.  Musculoskeletal: Positive for myalgias (RLE). Negative for arthralgias.  Skin: Negative for pallor and wound.  Neurological: Negative for dizziness, syncope,  numbness and headaches.     Physical Exam Updated Vital Signs BP (!) 125/92   Pulse 81   Temp 98.9 F (37.2 C) (Oral)   Resp 18   Ht 5\' 4"  (1.626 m)   Wt 236 lb (107 kg)   SpO2 100%   BMI 40.51 kg/m   Physical Exam  Constitutional: She is oriented to person, place, and time. She appears well-developed and well-nourished. No distress.  HENT:  Head: Normocephalic and atraumatic.  Eyes: EOM are normal.  Pupils are equal, round, and reactive to light.  Neck: Normal range of motion. Neck supple.  Cardiovascular: Normal rate and regular rhythm.  Exam reveals no gallop and no friction rub.   No murmur heard. Pulmonary/Chest: Effort normal. She has no wheezes. She has no rales.  Abdominal: Soft. She exhibits no distension. There is no tenderness.  Musculoskeletal: She exhibits edema. She exhibits no tenderness.  RLE: Exam limited due to pt being ticklish. No focal area of pain noted to right thigh where she reported pain. No tenderness to attachment of the hamstring into the lower leg. Distal pulses are intact. +1 edema compared to LLE.   Neurological: She is alert and oriented to person, place, and time.  Skin: Skin is warm and dry. She is not diaphoretic.  Psychiatric: She has a normal mood and affect. Her behavior is normal.  Nursing note and vitals reviewed.    ED Treatments / Results  DIAGNOSTIC STUDIES: Oxygen Saturation is 100% on RA, normal by my interpretation.    COORDINATION OF CARE: 11:17 PM- Pt advised of plan for treatment and pt agrees.  Labs (all labs ordered are listed, but only abnormal results are displayed) Labs Reviewed - No data to display  EKG  EKG Interpretation None       Radiology No results found.  Procedures Procedures (including critical care time)  Medications Ordered in ED Medications  acetaminophen (TYLENOL) tablet 1,000 mg (1,000 mg Oral Refused 12/14/16 2339)  ibuprofen (ADVIL,MOTRIN) tablet 800 mg (800 mg Oral Refused 12/14/16 2339)  oxyCODONE (Oxy IR/ROXICODONE) immediate release tablet 5 mg (5 mg Oral Refused 12/14/16 2339)     Initial Impression / Assessment and Plan / ED Course  I have reviewed the triage vital signs and the nursing notes.  Pertinent labs & imaging results that were available during my care of the patient were reviewed by me and considered in my medical decision making (see chart for details).     56 yo F With a  chief complaints of right hamstring pain. Going on for the past 3 days. She denies injury initially but then states that cat tried to attack her a few days ago. Difficult to examine the patient due to her being extremely ticklish. She does have some edema compared to the other side. I doubt DVT with the acute pain component but will order one for the morning.  11:56 PM:  I have discussed the diagnosis/risks/treatment options with the patient and family and believe the pt to be eligible for discharge home to follow-up with PCP. We also discussed returning to the ED immediately if new or worsening sx occur. We discussed the sx which are most concerning (e.g., sudden worsening pain, fever, inability to tolerate by mouth) that necessitate immediate return. Medications administered to the patient during their visit and any new prescriptions provided to the patient are listed below.  Medications given during this visit Medications  acetaminophen (TYLENOL) tablet 1,000 mg (1,000 mg Oral Refused 12/14/16 2339)  ibuprofen (ADVIL,MOTRIN) tablet 800 mg (800 mg Oral Refused 12/14/16 2339)  oxyCODONE (Oxy IR/ROXICODONE) immediate release tablet 5 mg (5 mg Oral Refused 12/14/16 2339)     The patient appears reasonably screen and/or stabilized for discharge and I doubt any other medical condition or other [facility] requiring further screening, evaluation, or treatment in the ED at this time prior to discharge.    Final Clinical Impressions(s) / ED Diagnoses   Final diagnoses:  Hamstring strain, initial encounter    New Prescriptions Discharge Medication List as of 12/14/2016 11:20 PM     I personally performed the services described in this documentation, which was scribed in my presence. The recorded information has been reviewed and is accurate.     Deno Etienne, DO 12/14/16 2357

## 2016-12-15 ENCOUNTER — Ambulatory Visit (HOSPITAL_COMMUNITY)
Admission: RE | Admit: 2016-12-15 | Discharge: 2016-12-15 | Disposition: A | Discharge status: Home / Self Care | Payer: Medicare Other | Source: Ambulatory Visit | Attending: Emergency Medicine | Admitting: Emergency Medicine

## 2016-12-15 ENCOUNTER — Ambulatory Visit (HOSPITAL_COMMUNITY)
Admission: AD | Admit: 2016-12-15 | Discharge: 2016-12-15 | Disposition: A | Discharge status: Home / Self Care | Payer: Medicare Other | Source: Ambulatory Visit | Attending: Emergency Medicine | Admitting: Emergency Medicine

## 2016-12-15 DIAGNOSIS — M79609 Pain in unspecified limb: Secondary | ICD-10-CM | POA: Diagnosis not present

## 2016-12-15 DIAGNOSIS — M79604 Pain in right leg: Secondary | ICD-10-CM | POA: Diagnosis not present

## 2016-12-15 NOTE — Progress Notes (Signed)
VASCULAR LAB PRELIMINARY  PRELIMINARY  PRELIMINARY  PRELIMINARY  Right lower extremity venous duplex completed.    Preliminary report:  There is no DVT or SVT noted in the right lower extremity.   Tallis Soledad, RVT 12/15/2016, 9:45 AM

## 2016-12-20 ENCOUNTER — Telehealth: Payer: Self-pay | Admitting: Podiatry

## 2016-12-20 NOTE — Telephone Encounter (Signed)
Patient called leaving a message wanting to know if she could be seen as early as tomorrow. She knows she has an appointment next week (the appointment is scheduled for Monday 05/21 at 9:45 am.) She stated she is having severe pain in the back of her leg, calf area as well as the back of her foot. Stated she went to the ER on crutches the other night. She said she requested to be referred to a back doctor but was told it was her leg.

## 2016-12-20 NOTE — Telephone Encounter (Addendum)
I spoke with pt and she states ER said it was her leg, pt states it hurts when she walks, and she began to cry. I told her I would get scheduler to get her in tomorrow with a doctor. 12/25/2016-Pt states someone called for the MRI. I told her that she should take the 1st available and Dr. Jacqualyn Posey could change the cast on Friday.

## 2016-12-21 ENCOUNTER — Ambulatory Visit (INDEPENDENT_AMBULATORY_CARE_PROVIDER_SITE_OTHER): Payer: Medicare Other | Admitting: Podiatry

## 2016-12-21 ENCOUNTER — Encounter: Payer: Self-pay | Admitting: Podiatry

## 2016-12-21 DIAGNOSIS — M792 Neuralgia and neuritis, unspecified: Secondary | ICD-10-CM

## 2016-12-21 DIAGNOSIS — T148XXA Other injury of unspecified body region, initial encounter: Secondary | ICD-10-CM

## 2016-12-21 MED ORDER — CYCLOBENZAPRINE HCL 10 MG PO TABS: 10.0000 mg | ORAL_TABLET | Freq: Three times a day (TID) | ORAL | 0 refills | Status: DC | PRN

## 2016-12-21 MED ORDER — OXYCODONE-ACETAMINOPHEN 5-325 MG PO TABS: 1.0000 | ORAL_TABLET | Freq: Four times a day (QID) | ORAL | 0 refills | Status: DC | PRN

## 2016-12-24 ENCOUNTER — Ambulatory Visit: Payer: Medicare Other | Admitting: Podiatry

## 2016-12-24 NOTE — Progress Notes (Signed)
Subjective: 56 year old female presents the office today for concerns of worsening pain to her right leg into her ankle. She feels that she is having quite a bit of calf pain. She states the pain started in her heel at first and then started to go back of her leg and she points to the calf or she is the majority of symptoms. She denies any recent injury or trauma. She states that she also gets sharp pain in her leg and she describes it as a stabbing sharp pain over of the back of her leg and into her hip. Denies any popping or sudden increase in pain. Denies any systemic complaints such as fevers, chills, nausea, vomiting. No acute changes since last appointment, and no other complaints at this time.   She did have a venous duplex performed which was negative.  Objective: AAO x3, NAD DP/PT pulses palpable bilaterally, CRT less than 3 seconds There is mild edema to the right calf. There is diffuse tenderness to the anterior both the posterior aspect of the Most along the calf musculature. There is tenderness in the course of posterior tibial tendon minimally to the heel. I am able to palpate the Achilles tendon however there is pain on this area but I'm unable to appreciate any frank tearing of the tendon. There is difficulty and pain upon dorsiflexion of the ankle. Positive tinel sign.  No open lesions or pre-ulcerative lesions.  No pain with calf compression, swelling, warmth, erythema  Assessment: 56 year old female with right calf pain, neuritis.   Plan: -All treatment options discussed with the patient including all alternatives, risks, complications.  -At this point I have recommended an MRI of the calf given this is where she has the majority of pain -Ordered a nerve conduction test -Remain NWB -Percocet for pain -Will also try flexeril  -Patient encouraged to call the office with any questions, concerns, change in symptoms.   Celesta Gentile, DPM

## 2016-12-25 ENCOUNTER — Telehealth: Payer: Self-pay | Admitting: Podiatry

## 2016-12-25 NOTE — Telephone Encounter (Signed)
Left message for patient to call back to reschedule her appointment on 12/28/16. She needed a 1 week follow-up with provider. Patient typically sees Dr. Amalia Hailey. Cannot be on nurse schedule per Alexander. Awaiting call back from patient.

## 2016-12-27 ENCOUNTER — Ambulatory Visit: Payer: Medicare Other | Admitting: Neurology

## 2016-12-27 NOTE — Progress Notes (Deleted)
Patient arrived for EMG of the right lower leg, however, testing was rescheduled because (1) her leg is immobilized in a cast and (2) symptoms started on 5/11.  We need to wait at least 3 weeks from symptom onset in order to detect nerve injury on electrodiagnostic testing.  She has been rescheduled to June 5th at [place]. Posey Pronto, DO

## 2016-12-28 ENCOUNTER — Ambulatory Visit: Payer: Medicare Other | Admitting: Podiatry

## 2016-12-28 ENCOUNTER — Other Ambulatory Visit: Payer: Medicare Other

## 2016-12-30 ENCOUNTER — Ambulatory Visit
Admission: RE | Admit: 2016-12-30 | Discharge: 2016-12-30 | Disposition: A | Discharge status: Home / Self Care | Payer: Medicare Other | Source: Ambulatory Visit | Attending: Podiatry | Admitting: Podiatry

## 2016-12-30 DIAGNOSIS — T148XXA Other injury of unspecified body region, initial encounter: Secondary | ICD-10-CM

## 2017-01-01 ENCOUNTER — Telehealth: Payer: Self-pay | Admitting: *Deleted

## 2017-01-01 NOTE — Telephone Encounter (Addendum)
-----   Message from Trula Slade, DPM sent at 01/01/2017 11:55 AM EDT ----- gastroc tear. Thanks.  ----- Message ----- From: Andres Ege, RN Sent: 01/01/2017  11:27 AM To: Trula Slade, DPM  Dr. Jacqualyn Posey, which diagnosis do you want me to refer pt to orthopedics for?Marcy Siren  ----- Message ----- From: Trula Slade, DPM Sent: 01/01/2017   7:21 AM To: Andres Ege, RN, Edrick Kins, DPM  +plantaris tendon rupture; strain and small tear of the medial and lateral gastroc muscles; achilles tendonosis with interstitial tear; right achilles tendonitis with interstitial tear. 01/14/2017-Pt states the oxycodone is not helping would like a muscle relaxer. Dr. Amalia Hailey ordered Flexeril 5mg  #30 one tablet 3 times day +1refill. Orders escribed to [facility].  Val- can you please put in a consult for orthopedics.  Follow-up with Dr. Amalia Hailey as scheduled this week.

## 2017-01-01 NOTE — Telephone Encounter (Signed)
-----   Message from Trula Slade, DPM sent at 01/01/2017  7:21 AM EDT ----- +plantaris tendon rupture; strain and small tear of the medial and lateral gastroc muscles; achilles tendonosis with interstitial tear; right achilles tendonitis with interstitial tear  Val- can you please put in a consult for orthopedics.  Follow-up with Dr. Amalia Hailey as scheduled this week.

## 2017-01-01 NOTE — Progress Notes (Signed)
Why are you referring to orthopedics? We can manage.  Follow up with me. Dr. Amalia Hailey

## 2017-01-02 ENCOUNTER — Ambulatory Visit (INDEPENDENT_AMBULATORY_CARE_PROVIDER_SITE_OTHER): Payer: Medicare Other | Admitting: Podiatry

## 2017-01-02 DIAGNOSIS — S86111D Strain of other muscle(s) and tendon(s) of posterior muscle group at lower leg level, right leg, subsequent encounter: Secondary | ICD-10-CM

## 2017-01-02 MED ORDER — MELOXICAM 15 MG PO TABS: 15.0000 mg | ORAL_TABLET | Freq: Every day | ORAL | 0 refills | Status: DC

## 2017-01-02 MED ORDER — OXYCODONE-ACETAMINOPHEN 5-325 MG PO TABS
1.0000 | ORAL_TABLET | Freq: Three times a day (TID) | ORAL | 0 refills | Status: DC | PRN
Start: 2017-01-02 — End: 2017-04-18

## 2017-01-03 NOTE — Progress Notes (Signed)
   Subjective: Patient presents today for follow-up evaluation right foot pain. She states the foot is still tender to touch. Elevation helps alleviate the pain. Pressure with walking intensifies the pain.   Objective: Physical Exam General: The patient is alert and oriented x3 in no acute distress.  Dermatology: Skin is warm, dry and supple bilateral lower extremities. Negative for open lesions or macerations bilateral.   Vascular: Dorsalis Pedis and Posterior Tibial pulses palpable bilateral.  Capillary fill time is immediate to all digits.  Neurological: Epicritic and protective threshold intact bilateral.   Musculoskeletal: Significant tenderness to palpation to the muscle belly of the right lower extremity gastrocnemius just proximal to the gastroc aponeurosis junction. There is a palpable dell also noted in the area.   Assessment: 1. Gastrocnemius muscle belly tear right 2. Plantaris tendon rupture right  Plan of Care:  1. Patient evaluated. MRI reviewed 2. CAM boot dispensed. 3. Wear CAM boot day and night. 4. Prescription for Meloxicam #60 given to patient. 5. Prescription for Percocet 5/325 mg #60 given to patient. 6. Return to clinic in 4 weeks.  Edrick Kins, DPM [facility]  Dr. Edrick Kins, [address]                                        [place], [place] [address]                Office [phone]  Fax [phone]

## 2017-01-08 ENCOUNTER — Encounter: Payer: Medicare Other | Admitting: Neurology

## 2017-01-14 MED ORDER — CYCLOBENZAPRINE HCL 5 MG PO TABS: 5.0000 mg | ORAL_TABLET | Freq: Three times a day (TID) | ORAL | 1 refills | Status: DC | PRN

## 2017-01-30 ENCOUNTER — Ambulatory Visit: Payer: Medicare Other | Admitting: Podiatry

## 2017-02-13 ENCOUNTER — Ambulatory Visit (INDEPENDENT_AMBULATORY_CARE_PROVIDER_SITE_OTHER): Payer: Medicare Other | Admitting: Podiatry

## 2017-02-13 DIAGNOSIS — S86111D Strain of other muscle(s) and tendon(s) of posterior muscle group at lower leg level, right leg, subsequent encounter: Secondary | ICD-10-CM | POA: Diagnosis not present

## 2017-02-24 NOTE — Progress Notes (Signed)
   Subjective: Patient presents today for follow-up evaluation of the gastrocnemius muscle belly tear to the right lower extremity as well as a plantaris tendon rupture right. Patient states that she's much better and does not express any more pain. She experiences intermittent occasional aching. Patient believes immobilization cam boot helped significantly.  Objective: Physical Exam General: The patient is alert and oriented x3 in no acute distress.  Dermatology: Skin is warm, dry and supple bilateral lower extremities. Negative for open lesions or macerations bilateral.   Vascular: Dorsalis Pedis and Posterior Tibial pulses palpable bilateral.  Capillary fill time is immediate to all digits.  Neurological: Epicritic and protective threshold intact bilateral.   Musculoskeletal: Negative for tenderness to palpation to the muscle belly of the right lower extremity gastrocnemius just proximal to the gastroc aponeurosis junction. There is a palpable dell also noted in the area.   Assessment: 1. Gastrocnemius muscle belly tear right-resolved 2. Plantaris tendon rupture right-resolved  Plan of Care:  1. Patient evaluated.  2. The patient can discontinue the cam boot and return to good supportive shoe gear. 3. Return to clinic when necessary  Edrick Kins, DPM [facility]  Dr. Edrick Kins, [address]                                        [place], [place] [address]                Office [phone]  Fax [phone]

## 2017-04-15 ENCOUNTER — Encounter (HOSPITAL_COMMUNITY): Payer: Self-pay | Admitting: *Deleted

## 2017-04-15 DIAGNOSIS — I10 Essential (primary) hypertension: Secondary | ICD-10-CM | POA: Insufficient documentation

## 2017-04-15 DIAGNOSIS — R0602 Shortness of breath: Secondary | ICD-10-CM | POA: Insufficient documentation

## 2017-04-15 DIAGNOSIS — Z79899 Other long term (current) drug therapy: Secondary | ICD-10-CM | POA: Insufficient documentation

## 2017-04-15 DIAGNOSIS — R0789 Other chest pain: Secondary | ICD-10-CM | POA: Diagnosis not present

## 2017-04-15 MED ORDER — ALBUTEROL SULFATE (2.5 MG/3ML) 0.083% IN NEBU
5.0000 mg | INHALATION_SOLUTION | Freq: Once | RESPIRATORY_TRACT | Status: AC
  Administered 2017-04-15: 5 mg via RESPIRATORY_TRACT

## 2017-04-15 MED ORDER — ALBUTEROL SULFATE (2.5 MG/3ML) 0.083% IN NEBU
INHALATION_SOLUTION | RESPIRATORY_TRACT | Status: AC
  Filled 2017-04-15: qty 6

## 2017-04-15 NOTE — ED Triage Notes (Signed)
Pt c/o chest pain and sob for the past few days, worsening tonight. Lung sounds diminished in triage.

## 2017-04-16 ENCOUNTER — Emergency Department (HOSPITAL_COMMUNITY): Payer: Medicare Other

## 2017-04-16 ENCOUNTER — Emergency Department (HOSPITAL_COMMUNITY)
Admission: EM | Admit: 2017-04-16 | Discharge: 2017-04-16 | Disposition: A | Discharge status: Home / Self Care | Payer: Medicare Other | Attending: Emergency Medicine | Admitting: Emergency Medicine

## 2017-04-16 DIAGNOSIS — R0789 Other chest pain: Secondary | ICD-10-CM

## 2017-04-16 LAB — CBC
HCT: 37.9 % (ref 36.0–46.0)
Hemoglobin: 11.9 g/dL — ABNORMAL LOW (ref 12.0–15.0)
MCH: 27.5 pg (ref 26.0–34.0)
MCHC: 31.4 g/dL (ref 30.0–36.0)
MCV: 87.7 fL (ref 78.0–100.0)
PLATELETS: 260 10*3/uL (ref 150–400)
RBC: 4.32 MIL/uL (ref 3.87–5.11)
RDW: 16.7 % — ABNORMAL HIGH (ref 11.5–15.5)
WBC: 7.6 10*3/uL (ref 4.0–10.5)

## 2017-04-16 LAB — BASIC METABOLIC PANEL
Anion gap: 7 (ref 5–15)
BUN: 12 mg/dL (ref 6–20)
CALCIUM: 8.8 mg/dL — AB (ref 8.9–10.3)
CHLORIDE: 107 mmol/L (ref 101–111)
CO2: 21 mmol/L — ABNORMAL LOW (ref 22–32)
CREATININE: 0.81 mg/dL (ref 0.44–1.00)
GFR calc non Af Amer: >60 mL/min (ref >60)
Glucose, Bld: 102 mg/dL — ABNORMAL HIGH (ref 65–99)
Potassium: 4 mmol/L (ref 3.5–5.1)
SODIUM: 135 mmol/L (ref 135–145)

## 2017-04-16 LAB — I-STAT TROPONIN, ED
TROPONIN I, POC: 0 ng/mL (ref 0.00–0.08)
TROPONIN I, POC: 0 ng/mL (ref 0.00–0.08)

## 2017-04-16 MED ORDER — ALBUTEROL SULFATE (2.5 MG/3ML) 0.083% IN NEBU
2.5000 mg | INHALATION_SOLUTION | Freq: Once | RESPIRATORY_TRACT | Status: AC
  Administered 2017-04-16: 2.5 mg via RESPIRATORY_TRACT

## 2017-04-16 MED ORDER — ASPIRIN 81 MG PO CHEW
324.0000 mg | CHEWABLE_TABLET | Freq: Once | ORAL | Status: AC
  Administered 2017-04-16: 324 mg via ORAL
  Filled 2017-04-16: qty 4

## 2017-04-16 MED ORDER — OXYCODONE-ACETAMINOPHEN 5-325 MG PO TABS
1.0000 | ORAL_TABLET | Freq: Once | ORAL | Status: AC
  Administered 2017-04-16: 1 via ORAL
  Filled 2017-04-16: qty 1

## 2017-04-16 MED ORDER — ALBUTEROL SULFATE (2.5 MG/3ML) 0.083% IN NEBU
INHALATION_SOLUTION | RESPIRATORY_TRACT | Status: AC
  Filled 2017-04-16: qty 3

## 2017-04-16 NOTE — Discharge Instructions (Signed)
You were seen today for chest pain. Your pain is somewhat atypical. However, you do have risk factors for heart disease. It is very important that you follow-up with cardiology for stress testing. If you have new or worsening symptoms you should be reevaluated immediately.

## 2017-04-16 NOTE — ED Notes (Addendum)
Pt c/o in waiting area states that her chest still feels tight and requesting another breathing treatment. MD aware

## 2017-04-16 NOTE — ED Provider Notes (Signed)
[facility] DEPT Provider Note   CSN: [id number] Arrival date & time: 04/15/17  2348     History   Chief Complaint Chief Complaint  Patient presents with  . Shortness of Breath  . Chest Pain    HPI Cathy [name] is a 56 y.o. female.  HPI  This is a 56 year old female with a history of hypertension who presents with chest pain. Patient reports 2 day history of waxing and waning chest pain. She also reports shortness of breath. She states the chest pain is somewhat worse with pressing on her chest but also worse with walking. She also reports some difficulty walking up steps. The pain is been constant. It is currently 7 out of 10. She has not taken anything for the pain.  Denies any cough or fevers. Denies any diaphoresis. Denies any leg swelling or history of blood clots. Denies history of diabetes, hyperlipidemia, early family history of heart disease.  On chart review, patient had a mildly elevated troponin in 2010. She had an adenosine stress test at that time. This was normal. She has not followed up with cardiology. She does take blood pressure medications daily in the morning.  Past Medical History:  Diagnosis Date  . Back pain   . Hypertension     Patient Active Problem List   Diagnosis Date Noted  . Hypertension 04/08/2015  . Left shoulder pain 04/08/2015  . HYPOPOTASSEMIA 03/14/2009  . 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 BACK SURGERY    . KNEE SURGERY      OB History    No data available       Home Medications    Prior to Admission medications   Medication Sig Start Date End Date Taking? Authorizing Provider  cyclobenzaprine (FLEXERIL) 10 MG tablet Take 1 tablet (10 mg total) by mouth 3 (three) times daily as needed for muscle spasms. 12/21/16   Trula Slade, DPM  cyclobenzaprine (FLEXERIL) 5 MG tablet Take 1 tablet (5 mg total) by mouth 3 (three) times daily as needed for  muscle spasms. 01/14/17   Edrick Kins, DPM  diazepam (VALIUM) 2 MG tablet Take 1 tablet (2 mg total) by mouth every 4 (four) hours as needed for muscle spasms. Patient not taking: Reported on 04/08/2015 03/31/15   Lacretia Leigh, MD  lisinopril-hydrochlorothiazide (PRINZIDE,ZESTORETIC) 10-12.5 MG per tablet Take 1 tablet by mouth daily. Patient not taking: Reported on 09/24/2016 04/08/15   Arnoldo Morale, MD  meloxicam (MOBIC) 15 MG tablet Take 1 tablet (15 mg total) by mouth daily. 01/02/17   Edrick Kins, DPM  oxyCODONE-acetaminophen (ROXICET) 5-325 MG tablet Take 1 tablet by mouth every 8 (eight) hours as needed for severe pain. 01/02/17   Edrick Kins, DPM  predniSONE (DELTASONE) 20 MG tablet Take 1 tablet (20 mg total) by mouth daily with breakfast. Patient not taking: Reported on 09/24/2016 04/08/15   Arnoldo Morale, MD    Family History Family History  Problem Relation Age of Onset  . Stroke Mother     Social History Social History  Substance Use Topics  . Smoking status: Former Research scientist (life sciences)  . Smokeless tobacco: Never Used  . Alcohol use No     Allergies   Patient has no known allergies.   Review of Systems Review of Systems  Constitutional: Negative for fever.  Respiratory: Positive for shortness of breath. Negative for cough.   Cardiovascular: Positive for chest pain.  Negative for leg swelling.  Gastrointestinal: Negative for abdominal pain, nausea and vomiting.  All other systems reviewed and are negative.    Physical Exam Updated Vital Signs BP (!) 176/96   Pulse 65   Temp 97.9 F (36.6 C) (Oral)   Resp 20   SpO2 100%   Physical Exam  Constitutional: She is oriented to person, place, and time. She appears well-developed and well-nourished.  Obese  HENT:  Head: Normocephalic and atraumatic.  Cardiovascular: Normal rate, regular rhythm and normal heart sounds.   No murmur heard. Pulmonary/Chest: Effort normal and breath sounds normal. No respiratory distress. She  has no wheezes. She exhibits tenderness.  Tenderness palpation left upper chest which replicates pain, no crepitus  Abdominal: Soft. Bowel sounds are normal. There is no tenderness. There is no guarding.  Musculoskeletal: She exhibits no edema.  Neurological: She is alert and oriented to person, place, and time.  Skin: Skin is warm and dry.  Psychiatric: She has a normal mood and affect.  Nursing note and vitals reviewed.    ED Treatments / Results  Labs (all labs ordered are listed, but only abnormal results are displayed) Labs Reviewed  BASIC METABOLIC PANEL - Abnormal; Notable for the following:       Result Value   CO2 21 (*)    Glucose, Bld 102 (*)    Calcium 8.8 (*)    All other components within normal limits  CBC - Abnormal; Notable for the following:    Hemoglobin 11.9 (*)    RDW 16.7 (*)    All other components within normal limits  I-STAT TROPONIN, ED  I-STAT TROPONIN, ED    EKG  EKG Interpretation  Date/Time:  Monday April 15 2017 23:53:54 EDT Ventricular Rate:  73 PR Interval:  152 QRS Duration: 78 QT Interval:  394 QTC Calculation: 434 R Axis:     Text Interpretation:  Normal sinus rhythm Possible Inferior infarct , age undetermined Abnormal ECG Confirmed by Thayer Jew [phone]) on 04/16/2017 5:45:42 AM Also confirmed by Thayer Jew [phone]), editor Hattie Perch ([id number])  on 04/16/2017 7:01:20 AM       Radiology Dg Chest 2 View  Result Date: 04/16/2017 CLINICAL DATA:  Chest pain and shortness of breath for the past few days, worsened tonight. EXAM: CHEST  2 VIEW COMPARISON:  Single-view of the chest 03/07/2009. FINDINGS: Lung volumes are low but the lungs are clear. Heart size is normal. No pneumothorax or pleural effusion. IMPRESSION: No acute disease. Electronically Signed   By: Inge Rise M.D.   On: 04/16/2017 00:28    Procedures Procedures (including critical care time)  Medications Ordered in ED Medications  albuterol  (PROVENTIL) (2.5 MG/3ML) 0.083% nebulizer solution (not administered)  albuterol (PROVENTIL) (2.5 MG/3ML) 0.083% nebulizer solution (not administered)  albuterol (PROVENTIL) (2.5 MG/3ML) 0.083% nebulizer solution 5 mg (5 mg Nebulization Given 04/15/17 2359)  albuterol (PROVENTIL) (2.5 MG/3ML) 0.083% nebulizer solution 2.5 mg (2.5 mg Nebulization Given 04/16/17 0126)  aspirin chewable tablet 324 mg (324 mg Oral Given 04/16/17 0611)  oxyCODONE-acetaminophen (PERCOCET/ROXICET) 5-325 MG per tablet 1 tablet (1 tablet Oral Given 04/16/17 5009)     Initial Impression / Assessment and Plan / ED Course  I have reviewed the triage vital signs and the nursing notes.  Pertinent labs & imaging results that were available during my care of the patient were reviewed by me and considered in my medical decision making (see chart for details).     Patient presents with chest pain.  Ongoing for last 2 days. Somewhat atypical in that it is reproducible however, there is also an exertional component. EKG shows no evidence of acute ischemia initial troponin is negative. Patient has been in the waiting room for several hours at this point. Will repeat troponin. Patient was given aspirin and Percocet.  Repeat troponin is negative. She does have some risk factors for ACS. Heart score is 3. However, given ongoing pain and 2 negative troponins with a reproducible component, feel she can safely follow up with cardiology closely for repeat stress testing. On recheck, patient reports some improvement of pain. Discussed the results with the patient.  After history, exam, and medical workup I feel the patient has been appropriately medically screened and is safe for discharge home. Pertinent diagnoses were discussed with the patient. Patient was given return precautions.   Final Clinical Impressions(s) / ED Diagnoses   Final diagnoses:  Atypical chest pain    New Prescriptions New Prescriptions   No medications on file      Merryl Hacker, MD 04/16/17 [phone]

## 2017-04-16 NOTE — ED Notes (Signed)
Patient c/o chest tightness and soreness, states she normally has chest spasms on the left lateral part of her chest of which she takes muscle relaxer's  States she took one tonight however it didn't work. States pain is worse with movement denies cough.

## 2017-04-18 ENCOUNTER — Ambulatory Visit (INDEPENDENT_AMBULATORY_CARE_PROVIDER_SITE_OTHER): Payer: Medicare Other | Admitting: Cardiology

## 2017-04-18 ENCOUNTER — Encounter: Payer: Self-pay | Admitting: Cardiology

## 2017-04-18 VITALS — BP 120/80 | HR 68 | Resp 12 | Ht 64.0 in | Wt 248.1 lb

## 2017-04-18 DIAGNOSIS — G4733 Obstructive sleep apnea (adult) (pediatric): Secondary | ICD-10-CM

## 2017-04-18 DIAGNOSIS — I10 Essential (primary) hypertension: Secondary | ICD-10-CM | POA: Diagnosis not present

## 2017-04-18 DIAGNOSIS — R0609 Other forms of dyspnea: Secondary | ICD-10-CM

## 2017-04-18 DIAGNOSIS — R7989 Other specified abnormal findings of blood chemistry: Secondary | ICD-10-CM | POA: Diagnosis not present

## 2017-04-18 DIAGNOSIS — R0789 Other chest pain: Secondary | ICD-10-CM | POA: Diagnosis not present

## 2017-04-18 NOTE — Progress Notes (Signed)
Cardiology Consultation:    Date:  04/18/2017   ID:  Cathy [name], DOB 1961-06-13, MRN [id number]  PCP:  Patient, No Pcp Per  Cardiologist:  Jenne Campus, MD   Referring MD: No ref. provider found   Chief Complaint  Patient presents with  . ER follow up  . Chest Pain  . Shortness of Breath  I'm having chest pain  History of Present Illness:    Cathy [name] is a 56 y.o. female who is being seen today for the evaluation of Chest pain at the request of No ref. provider found. Patient is a 56 years old woman with past medical history significant for essential hypertension, she smoked long time ago, this family history of premature coronary artery disease, high cholesterol status unknown. Last Sunday which was 5 days ago she went to football game when she was walking uphill she became significantly short of breath she developed some pain but not at the moment of walking but after she was sitting for a while on the bench. She described the pain being 10 in scale up to 10. It was necessary with shortness of breath. Eventually she ended up going to the emergency room following day a lot of testing was done and none of this was inconclusive. Her troponin I's were negative. She was given some pain medication pain is worse by pressing chest wall, there is a little soreness on the left and right side of her chest. Taking deep breath we will make the pain as well. She does not have any leg pain however about a month ago she had some cough pain because of some suspicion for muscle injury. Since the time of hospitalization she feels much better still have some pain with worsening while taking deep breath., Still shortness of breath but shortness of breath is a chronic sensation she said she had it for last few years. She does not go to do shopping because of exertional shortness of breath. She cannot climb stairs too well because of shortness of breath. She does snore and she said she some does wake up  in the middle of the night while laying on her back.   Past Medical History:  Diagnosis Date  . Back pain   . Hypertension     Past Surgical History:  Procedure Laterality Date  . BACK SURGERY    . KNEE SURGERY      Current Medications: Current Meds  Medication Sig  . cloNIDine (CATAPRES) 0.1 MG tablet Take 0.1 mg by mouth 2 (two) times daily.  . cyclobenzaprine (FLEXERIL) 5 MG tablet Take 1 tablet (5 mg total) by mouth 3 (three) times daily as needed for muscle spasms.  . DULoxetine (CYMBALTA) 20 MG capsule Take 20 mg by mouth daily.   Current Facility-Administered Medications for the 04/18/17 encounter (Office Visit) with Park Liter, MD  Medication  . betamethasone acetate-betamethasone sodium phosphate (CELESTONE) injection 3 mg  . betamethasone acetate-betamethasone sodium phosphate (CELESTONE) injection 3 mg  . betamethasone acetate-betamethasone sodium phosphate (CELESTONE) injection 3 mg     Allergies:   Patient has no known allergies.   Social History   Social History  . Marital status: Divorced    Spouse name: N/A  . Number of children: N/A  . Years of education: N/A   Social History Main Topics  . Smoking status: Former Research scientist (life sciences)  . Smokeless tobacco: Never Used  . Alcohol use No  . Drug use: No  . Sexual activity: Not Asked  Other Topics Concern  . None   Social History Narrative  . None     Family History: The patient's family history includes Stroke in her mother. ROS:   Please see the history of present illness.    All 14 point review of systems negative except as described per history of present illness.  EKGs/Labs/Other Studies Reviewed:    The following studies were reviewed today: All laboratory test from emergency room reviewed  EKG:  EKG is  ordered today.  The ekg ordered today demonstrates normal sinus rhythm, normal. Interval, normal QRS complex duration and morphology, nonspecific ST segment changes  Recent  Labs: 04/15/2017: BUN 12; Creatinine, Ser 0.81; Hemoglobin 11.9; Platelets 260; Potassium 4.0; Sodium 135  Recent Lipid Panel    Component Value Date/Time   CHOL  03/07/2009 0902    164        ATP III CLASSIFICATION:  <200     mg/dL   Desirable  200-239  mg/dL   Borderline High  >=240    mg/dL   High          TRIG 48 03/07/2009 0902   HDL 72 03/07/2009 0902   CHOLHDL 2.3 03/07/2009 0902   VLDL 10 03/07/2009 0902   LDLCALC  03/07/2009 0902    82        Total Cholesterol/HDL:CHD Risk Coronary Heart Disease Risk Table                     Men   Women  1/2 Average Risk   3.4   3.3  Average Risk       5.0   4.4  2 X Average Risk   9.6   7.1  3 X Average Risk  23.4   11.0        Use the calculated Patient Ratio above and the CHD Risk Table to determine the patient's CHD Risk.        ATP III CLASSIFICATION (LDL):  <100     mg/dL   Optimal  100-129  mg/dL   Near or Above                    Optimal  130-159  mg/dL   Borderline  160-189  mg/dL   High  >190     mg/dL   Very High    Physical Exam:    VS:  BP 120/80   Pulse 68   Resp 12   Ht 5\' 4"  (1.626 m)   Wt 248 lb 1.9 oz (112.5 kg)   BMI 42.59 kg/m     Wt Readings from Last 3 Encounters:  04/18/17 248 lb 1.9 oz (112.5 kg)  12/14/16 236 lb (107 kg)  09/02/16 250 lb (113.4 kg)     GEN:  Well nourished, well developed in no acute distress HEENT: Normal NECK: No JVD; No carotid bruits LYMPHATICS: No lymphadenopathy CARDIAC: RRR, no murmurs, no rubs, no gallops RESPIRATORY:  Clear to auscultation without rales, wheezing or rhonchi  ABDOMEN: Soft, non-tender, non-distended MUSCULOSKELETAL:  No edema; No deformity  SKIN: Warm and dry NEUROLOGIC:  Alert and oriented x 3 PSYCHIATRIC:  Normal affect   ASSESSMENT:    1. Essential hypertension   2. Atypical chest pain   3. Dyspnea on exertion   4. Obstructive sleep apnea    PLAN:    In order of problems listed above:  1. Essential hypertension: Blood pressure  seems to be well-controlled today we'll continue present  management. 2. Atypical chest pain. Pain is lasting for long period of time pain is reproducible by pressing chest wall as well as taking deep breath. I'm less worry about her having acute coronary syndrome however and right extreme shortness with exertion make me worry and I'm worried that that may be equivalent of angina. Therefore I think it will be reasonable to repeat her EKG today, we'll ask her to have an echocardiogram to assess left ventricular ejection fraction as well as right ventricle pressure, I will ask her to have a stress test to rule out ischemia. Another potential exhalation for symptoms would be pulmonary emboli however that is rather lower on my list of differential diagnosis still I think it's reasonable to check a d-dimer make sure it's an within normal limits. It is already 5 days after her hospitalization surgery should be normal if not we'll may proceed to CT angina. 3. Obstructive sleep apnea she does snore a lot and I suspect she does have sleep apnea and is something we'll discuss the ready she is not committed to do any testing for it but nothing in the future we'll be able to accomplish that.   Medication Adjustments/Labs and Tests Ordered: Current medicines are reviewed at length with the patient today.  Concerns regarding medicines are outlined above.  No orders of the defined types were placed in this encounter.  No orders of the defined types were placed in this encounter.   Signed, Park Liter, MD, [facility]. 04/18/2017 10:22 AM    [place]

## 2017-04-18 NOTE — Patient Instructions (Signed)
Medication Instructions:  Your physician recommends that you continue on your current medications as directed. Please refer to the Current Medication list given to you today.  Labwork: Your physician recommends that you have a D-Dimer obtained today in the office.   Testing/Procedures: Your physician has requested that you have an echocardiogram. Echocardiography is a painless test that uses sound waves to create images of your heart. It provides your doctor with information about the size and shape of your heart and how well your heart's chambers and valves are working. This procedure takes approximately one hour. There are no restrictions for this procedure.  Your physician has requested that you have a lexiscan myoview. For further information please visit [email or web address]. Please follow instruction sheet, as given.  Please report to [address], [address] [place], [place] the day of your testing.   Follow-Up: Your physician recommends that you schedule a follow-up appointment in: 1 month   Any Other Special Instructions Will Be Listed Below (If Applicable).  Please note that any paperwork needing to be filled out by the provider will need to be addressed at the front desk prior to seeing the provider. Please note that any paperwork FMLA, Disability or other documents regarding health condition is subject to a $25.00 charge that must be received prior to completion of paperwork in the form of a money order or check.     If you need a refill on your cardiac medications before your next appointment, please call your pharmacy.

## 2017-04-19 ENCOUNTER — Encounter (HOSPITAL_BASED_OUTPATIENT_CLINIC_OR_DEPARTMENT_OTHER): Payer: Self-pay

## 2017-04-19 ENCOUNTER — Telehealth: Payer: Self-pay

## 2017-04-19 ENCOUNTER — Emergency Department (HOSPITAL_BASED_OUTPATIENT_CLINIC_OR_DEPARTMENT_OTHER)
Admission: EM | Admit: 2017-04-19 | Discharge: 2017-04-19 | Disposition: A | Discharge status: Home / Self Care | Payer: Medicare Other | Attending: Emergency Medicine | Admitting: Emergency Medicine

## 2017-04-19 ENCOUNTER — Ambulatory Visit (HOSPITAL_BASED_OUTPATIENT_CLINIC_OR_DEPARTMENT_OTHER)
Admission: RE | Admit: 2017-04-19 | Discharge: 2017-04-19 | Disposition: A | Discharge status: Home / Self Care | Payer: Medicare Other | Source: Ambulatory Visit | Attending: Cardiology | Admitting: Cardiology

## 2017-04-19 DIAGNOSIS — T7840XA Allergy, unspecified, initial encounter: Secondary | ICD-10-CM

## 2017-04-19 DIAGNOSIS — R0609 Other forms of dyspnea: Secondary | ICD-10-CM | POA: Insufficient documentation

## 2017-04-19 DIAGNOSIS — I10 Essential (primary) hypertension: Secondary | ICD-10-CM | POA: Diagnosis not present

## 2017-04-19 DIAGNOSIS — Z79899 Other long term (current) drug therapy: Secondary | ICD-10-CM | POA: Insufficient documentation

## 2017-04-19 DIAGNOSIS — Z87891 Personal history of nicotine dependence: Secondary | ICD-10-CM | POA: Insufficient documentation

## 2017-04-19 DIAGNOSIS — R7989 Other specified abnormal findings of blood chemistry: Secondary | ICD-10-CM | POA: Insufficient documentation

## 2017-04-19 LAB — D-DIMER, QUANTITATIVE (NOT AT ARMC): D-DIMER: 1.03 mg{FEU}/L — AB (ref 0.00–0.49)

## 2017-04-19 MED ORDER — IOPAMIDOL (ISOVUE-370) INJECTION 76%
100.0000 mL | Freq: Once | INTRAVENOUS | Status: AC | PRN
  Administered 2017-04-19: 100 mL via INTRAVENOUS

## 2017-04-19 NOTE — Addendum Note (Signed)
Addended by: Kathyrn Sheriff on: 04/19/2017 10:02 AM   Modules accepted: Orders

## 2017-04-19 NOTE — Discharge Instructions (Signed)
Your CT angiogram was negative for any signs of a blood clot in your lungs. Considering your symptoms have all resolved and you're no longer see sneezing, I do not believe you have a significant allergy to IV contrast.

## 2017-04-19 NOTE — Telephone Encounter (Signed)
Left message for pt and advised that her D-Dimer was elevated and as discussed yesterday we need to proceed with CTA of chest to r/o PE. I have advised her that after speaking with imaging at [facility], no appointment is needed for today and she may come at anytime but per Imaging staff, they would like if she could come before 3.

## 2017-04-19 NOTE — Telephone Encounter (Signed)
S/w imaging who notes the pts test was negative however pt developed a reaction to the contrast at which she was transported to the ED for evaluation and treatment.

## 2017-04-19 NOTE — ED Provider Notes (Signed)
[facility]PT [facility] Provider Note   CSN: [id number] Arrival date & time: 04/19/17  1342     History   Chief Complaint Chief Complaint  Patient presents with  . Allergic Reaction    HPI Cathy [name] is a 56 y.o. female.  HPI    Patient states she had an allergic reaction.States she was sent here by her cardiologist to get a CT angiogram to rule out a PE. After CT was done and patient sneezed several times and had a stuffy nose. Nurse was concerned and sent her down to be evaluated for possible allergic reaction to IV contrast. Patient states she was sneezing and her nose felt stuffed. Denies any wheezing, SOB, swelling of throat, rash.   Past Medical History:  Diagnosis Date  . Back pain   . Hypertension     Patient Active Problem List   Diagnosis Date Noted  . Atypical chest pain 04/18/2017  . Dyspnea on exertion 04/18/2017  . Obstructive sleep apnea 04/18/2017  . Hypertension 04/08/2015  . Left shoulder pain 04/08/2015  . HYPOPOTASSEMIA 03/14/2009  . 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 BACK SURGERY    . KNEE SURGERY      OB History    No data available       Home Medications    Prior to Admission medications   Medication Sig Start Date End Date Taking? Authorizing Provider  cloNIDine (CATAPRES) 0.1 MG tablet Take 0.1 mg by mouth 2 (two) times daily.    [provider]  cyclobenzaprine (FLEXERIL) 5 MG tablet Take 1 tablet (5 mg total) by mouth 3 (three) times daily as needed for muscle spasms. 01/14/17   Edrick Kins, DPM  DULoxetine (CYMBALTA) 20 MG capsule Take 20 mg by mouth daily.    [provider]    Family History Family History  Problem Relation Age of Onset  . Stroke Mother     Social History Social History  Substance Use Topics  . Smoking status: Former Research scientist (life sciences)  . Smokeless tobacco: Never Used  . Alcohol use No     Allergies   Isovue  [iopamidol]   Review of Systems Review of Systems  Constitutional: Negative for chills and fever.  HENT: Negative for trouble swallowing and voice change.   Respiratory: Negative for cough, shortness of breath and wheezing.   Gastrointestinal: Negative for abdominal pain, diarrhea, nausea and vomiting.     Physical Exam Updated Vital Signs BP (!) 149/94 (BP Location: Right Arm)   Pulse 78   Temp 98.3 F (36.8 C) (Oral)   Resp 16   Ht 5\' 4"  (1.626 m)   Wt 112.5 kg (248 lb)   SpO2 97%   BMI 42.57 kg/m   Physical Exam  Constitutional: She is oriented to person, place, and time. She appears well-developed and well-nourished.  HENT:  Head: Normocephalic and atraumatic.  Eyes: Pupils are equal, round, and reactive to light. Conjunctivae are normal.  Neck: Normal range of motion. Neck supple.  Cardiovascular: Normal rate, regular rhythm and intact distal pulses.   Pulmonary/Chest: Effort normal and breath sounds normal.  Abdominal: Soft. Bowel sounds are normal.  Musculoskeletal: Normal range of motion.  Neurological: She is alert and oriented to person, place, and time.  Skin: Skin is warm. Capillary refill takes less than 2 seconds.  Psychiatric: She has a normal mood and affect.     ED Treatments / Results  Labs (all labs ordered are listed, but only abnormal results are displayed) Labs Reviewed - No data to display  EKG  EKG Interpretation None       Radiology Ct Angio Chest Pe W Or Wo Contrast  Result Date: 04/19/2017 CLINICAL DATA:  Elevated D-dimer, shortness of breath with exertion, BILATERAL scapular pain, sleeps sitting up to breath and feel comfortable, elevated D-dimer, history hypertension EXAM: CT ANGIOGRAPHY CHEST WITH CONTRAST TECHNIQUE: Multidetector CT imaging of the chest was performed using the standard protocol during bolus administration of intravenous contrast. Multiplanar CT image reconstructions and MIPs were obtained to evaluate the vascular  anatomy. CONTRAST:  100 cc Isovue 370 IV Patient developed sneezing and congestion postcontrast administration. This was considered to be a minor contrast reaction and the patient was taken to the emergency department for evaluation. COMPARISON:  None FINDINGS: Cardiovascular: Aorta normal caliber without aneurysm or dissection. No pericardial effusion. Degradation of image quality secondary to body habitus. Pulmonary arteries adequately opacified and patent. No evidence of pulmonary embolism. Mediastinum/Nodes: Esophagus unremarkable. Base of cervical region normal appearance. No thoracic adenopathy. Lungs/Pleura: Lungs clear. No infiltrate, pleural effusion, or pneumothorax. LEFT Upper Abdomen: Visualized upper abdomen unremarkable Musculoskeletal: No acute osseous findings. Review of the MIP images confirms the above findings. IMPRESSION: Normal CTA chest. No evidence of pulmonary embolism. No acute intrathoracic abnormalities. Electronically Signed   By: Lavonia Dana M.D.   On: 04/19/2017 13:50    Procedures Procedures (including critical care time)  Medications Ordered in ED Medications - No data to display   Initial Impression / Assessment and Plan / ED Course  I have reviewed the triage vital signs and the nursing notes.  Pertinent labs & imaging results that were available during my care of the patient were reviewed by me and considered in my medical decision making (see chart for details).     Patient presenting for evaluation for possible allergic reaction to IV contrast. He was sent by cardiology to be evaluated for possible pulmonary embolus with CT angiogram. Patient had some sneezing afterwards and slightly nasal congestion. No signs or symptoms concerning for anaphylaxis patient's physical exam was within normal limits, normal vitals Final Clinical Impressions(s) / ED Diagnoses   Final diagnoses:  None    New Prescriptions New Prescriptions   No medications on file       Tonette Bihari, MD 04/19/17 [place], Kevin, MD 04/19/17 1554

## 2017-04-19 NOTE — ED Notes (Signed)
Pt ambulatory unassisted, in NAD.

## 2017-04-19 NOTE — Telephone Encounter (Signed)
-----   Message from [facility] sent at 04/19/2017 10:11 AM EDT ----- No precert required.  Pt has [employer] MCR ----- Message ----- From: Kathyrn Sheriff, RN Sent: 04/19/2017   9:56 AM To: [facility] [address] Pre Cert/Auth  Pre cert:   Test: CT ANGIO CHEST PE W OR WO CONTRAST ICD: Dyspnea on exertion [R06.09]          Elevated d-dimer [R79.89]  Ordering: Agustin Cree Location: [facility]  DOS: ASAP

## 2017-04-19 NOTE — ED Triage Notes (Signed)
Pt ambulated with Rad tech from CT-pt started having nasal congestion, sneezing, watery eyes after iodine injection for CT-NAD-steady gait

## 2017-05-01 ENCOUNTER — Telehealth (HOSPITAL_COMMUNITY): Payer: Self-pay | Admitting: *Deleted

## 2017-05-01 NOTE — Telephone Encounter (Signed)
Patient given detailed instructions per Myocardial Perfusion Study Information Sheet for the test on 05/06/17. Patient notified to arrive 15 minutes early and that it is imperative to arrive on time for appointment to keep from having the test rescheduled.  If you need to cancel or reschedule your appointment, please call the office within 24 hours of your appointment. . Patient verbalized understanding. Cathy [name]    

## 2017-05-06 ENCOUNTER — Ambulatory Visit (HOSPITAL_COMMUNITY): Payer: Medicare Other | Attending: Internal Medicine

## 2017-05-06 ENCOUNTER — Other Ambulatory Visit: Payer: Self-pay

## 2017-05-06 ENCOUNTER — Ambulatory Visit (HOSPITAL_BASED_OUTPATIENT_CLINIC_OR_DEPARTMENT_OTHER): Payer: Medicare Other

## 2017-05-06 DIAGNOSIS — I10 Essential (primary) hypertension: Secondary | ICD-10-CM | POA: Insufficient documentation

## 2017-05-06 DIAGNOSIS — R0609 Other forms of dyspnea: Secondary | ICD-10-CM | POA: Diagnosis not present

## 2017-05-06 DIAGNOSIS — R0789 Other chest pain: Secondary | ICD-10-CM

## 2017-05-06 MED ORDER — REGADENOSON 0.4 MG/5ML IV SOLN
0.4000 mg | Freq: Once | INTRAVENOUS | Status: AC
  Administered 2017-05-06: 0.4 mg via INTRAVENOUS

## 2017-05-06 MED ORDER — TECHNETIUM TC 99M TETROFOSMIN IV KIT
32.6000 | PACK | Freq: Once | INTRAVENOUS | Status: AC | PRN
  Administered 2017-05-06: 32.6 via INTRAVENOUS
  Filled 2017-05-06: qty 33

## 2017-05-07 ENCOUNTER — Ambulatory Visit (HOSPITAL_COMMUNITY): Payer: Medicare Other | Attending: Cardiology

## 2017-05-07 LAB — MYOCARDIAL PERFUSION IMAGING
CHL CUP NUCLEAR SSS: 10
CSEPPHR: 90 {beats}/min
LHR: 0.26
LV sys vol: 69 mL
LVDIAVOL: 127 mL (ref 46–106)
NUC STRESS TID: 1.02
Rest HR: 66 {beats}/min
SDS: 10
SRS: 1

## 2017-05-07 MED ORDER — TECHNETIUM TC 99M TETROFOSMIN IV KIT
31.9000 | PACK | Freq: Once | INTRAVENOUS | Status: AC | PRN
Start: 2017-05-07 — End: 2017-05-07
  Administered 2017-05-07: 31.9 via INTRAVENOUS
  Filled 2017-05-07: qty 32

## 2017-05-17 ENCOUNTER — Encounter: Payer: Self-pay | Admitting: Cardiology

## 2017-05-17 ENCOUNTER — Ambulatory Visit (INDEPENDENT_AMBULATORY_CARE_PROVIDER_SITE_OTHER): Payer: Medicare Other | Admitting: Cardiology

## 2017-05-17 VITALS — BP 168/108 | HR 86 | Ht 64.0 in | Wt 251.0 lb

## 2017-05-17 DIAGNOSIS — R9439 Abnormal result of other cardiovascular function study: Secondary | ICD-10-CM | POA: Diagnosis not present

## 2017-05-17 DIAGNOSIS — R0789 Other chest pain: Secondary | ICD-10-CM

## 2017-05-17 DIAGNOSIS — R0609 Other forms of dyspnea: Secondary | ICD-10-CM

## 2017-05-17 DIAGNOSIS — I10 Essential (primary) hypertension: Secondary | ICD-10-CM

## 2017-05-17 MED ORDER — METOPROLOL TARTRATE 25 MG PO TABS: 25.0000 mg | ORAL_TABLET | Freq: Two times a day (BID) | ORAL | 3 refills | Status: DC

## 2017-05-17 MED ORDER — NITROGLYCERIN 0.4 MG SL SUBL: 0.4000 mg | SUBLINGUAL_TABLET | SUBLINGUAL | 11 refills | Status: DC | PRN

## 2017-05-17 MED ORDER — ATORVASTATIN CALCIUM 10 MG PO TABS: 10.0000 mg | ORAL_TABLET | Freq: Every day | ORAL | 3 refills | Status: DC

## 2017-05-17 NOTE — Patient Instructions (Addendum)
Medication Instructions:  Your physician has recommended you make the following change in your medication:  START metoprolol 25 mg twice daily START atorvastatin (Lipitor) 10 mg daily START nitroglycerin 0.4 mg sublingual (under your tongue) as needed for chest pain. When having chest pain, stop what you are doing and sit down. Take 1 nitro, wait 5 minutes. Still having chest pain, take 1 nitro, wait 5 minutes. Still having chest pain, take 1 nitro, dial 911. Total of 3 nitro in 15 minutes.  Labwork: Your physician recommends that you return for lab work in: today. CBC, BMP, pt/inr   Testing/Procedures: Your physician has requested that you have a cardiac catheterization. Cardiac catheterization is used to diagnose and/or treat various heart conditions. Doctors may recommend this procedure for a number of different reasons. The most common reason is to evaluate chest pain. Chest pain can be a symptom of coronary artery disease (CAD), and cardiac catheterization can show whether plaque is narrowing or blocking your heart's arteries. This procedure is also used to evaluate the valves, as well as measure the blood flow and oxygen levels in different parts of your heart. For further information please visit [email or web address]. Please follow instruction sheet, as given.  Follow-Up: Your physician recommends that you schedule a follow-up appointment in: 3 weeks.  Any Other Special Instructions Will Be Listed Below (If Applicable).     If you need a refill on your cardiac medications before your next appointment, please call your pharmacy.    [facility] [address], [place] [address] Dept: [phone] Loc: [phone]  Cathy [name]  05/17/2017  You are scheduled for a Cardiac Catheterization on Monday, October 15 with Dr. Glenetta Hew.  1. Please arrive at the [facility] (Main  Entrance A) at [facility]: [address] [place], [place] [address] at 5:30 AM (two hours before your procedure to ensure your preparation). Free valet parking service is available.   Special note: Every effort is made to have your procedure done on time. Please understand that emergencies sometimes delay scheduled procedures.  2. Diet: Do not eat or drink anything after midnight prior to your procedure except sips of water to take medications.  3. Labs: Your labs will be performed at the hospital after you arrive for your procedure.  4. Medication instructions in preparation for your procedure:   On the morning of your procedure, take your Aspirin and any morning medicines NOT listed above.  You may use sips of water.  5. Plan for one night stay--bring personal belongings. 6. Bring a current list of your medications and current insurance cards. 7. You MUST have a responsible person to drive you home. 8. Someone MUST be with you the first 24 hours after you arrive home or your discharge will be delayed. 9. Please wear clothes that are easy to get on and off and wear slip-on shoes.  Thank you for allowing [place] to care for you!   -- [facility]

## 2017-05-17 NOTE — Progress Notes (Signed)
Cardiology Office Note:    Date:  05/17/2017   ID:  Cathy [name], DOB Oct 06, 1960, MRN [id number]  PCP:  Patient, No Pcp Per  Cardiologist:  Jenne Campus, MD    Referring MD: Park Liter, MD   Chief Complaint  Patient presents with  . Follow-up    1 month flup after echo and lexiscan stress test   . Chest Pain  . Shortness of Breath  And still having chest pain  History of Present Illness:    Cathy [name] is a 56 y.o. female  with chest pain which initially looked very atypical however today she tells me that her chest tightness tightness like that happens when she walks necessary with shortness of breath and sweating. On top of that she said that she's getting the sensation when she gets argument with somebody. She did have a stress test which showed ischemia involving LAD territory. A supportive evaluation for atypical chest pain also be d-dimer which was abnormal CT of her chest was done which showed no evidence of PE. We talked in length about what to do with the situation we discussed basically 2 options option #1 treating this with medications option #2 cardiac catheterization she clearly prefers cardiac catheterization. The procedure was explained to her including risks benefits as well as alternatives. She agreed to proceed. In the meantime I will give her a small dose of metoprolol, will continue with aspirin, I will add statin.  Past Medical History:  Diagnosis Date  . Back pain   . Hypertension     Past Surgical History:  Procedure Laterality Date  . BACK SURGERY    . KNEE SURGERY      Current Medications: Current Meds  Medication Sig  . cloNIDine (CATAPRES) 0.1 MG tablet Take 0.1 mg by mouth 2 (two) times daily.  . cyclobenzaprine (FLEXERIL) 5 MG tablet Take 1 tablet (5 mg total) by mouth 3 (three) times daily as needed for muscle spasms.  . DULoxetine (CYMBALTA) 20 MG capsule Take 20 mg by mouth daily.   Current Facility-Administered Medications  for the 05/17/17 encounter (Office Visit) with Park Liter, MD  Medication  . betamethasone acetate-betamethasone sodium phosphate (CELESTONE) injection 3 mg  . betamethasone acetate-betamethasone sodium phosphate (CELESTONE) injection 3 mg  . betamethasone acetate-betamethasone sodium phosphate (CELESTONE) injection 3 mg     Allergies:   Isovue [iopamidol]   Social History   Social History  . Marital status: Divorced    Spouse name: N/A  . Number of children: N/A  . Years of education: N/A   Social History Main Topics  . Smoking status: Former Research scientist (life sciences)  . Smokeless tobacco: Never Used  . Alcohol use No  . Drug use: No  . Sexual activity: Not Asked   Other Topics Concern  . None   Social History Narrative  . None     Family History: The patient's family history includes Stroke in her mother. ROS:   Please see the history of present illness.    All 14 point review of systems negative except as described per history of present illness  EKGs/Labs/Other Studies Reviewed:      Recent Labs: 04/15/2017: BUN 12; Creatinine, Ser 0.81; Hemoglobin 11.9; Platelets 260; Potassium 4.0; Sodium 135  Recent Lipid Panel    Component Value Date/Time   CHOL  03/07/2009 0902    164        ATP III CLASSIFICATION:  <200     mg/dL   Desirable  200-239  mg/dL   Borderline High  >=240    mg/dL   High          TRIG 48 03/07/2009 0902   HDL 72 03/07/2009 0902   CHOLHDL 2.3 03/07/2009 0902   VLDL 10 03/07/2009 0902   LDLCALC  03/07/2009 0902    82        Total Cholesterol/HDL:CHD Risk Coronary Heart Disease Risk Table                     Men   Women  1/2 Average Risk   3.4   3.3  Average Risk       5.0   4.4  2 X Average Risk   9.6   7.1  3 X Average Risk  23.4   11.0        Use the calculated Patient Ratio above and the CHD Risk Table to determine the patient's CHD Risk.        ATP III CLASSIFICATION (LDL):  <100     mg/dL   Optimal  100-129  mg/dL   Near or Above                     Optimal  130-159  mg/dL   Borderline  160-189  mg/dL   High  >190     mg/dL   Very High    Physical Exam:    VS:  BP (!) 168/108 (BP Location: Right Arm, Patient Position: Sitting)   Pulse 86   Ht 5\' 4"  (1.626 m)   Wt 251 lb (113.9 kg)   SpO2 97%   BMI 43.08 kg/m     Wt Readings from Last 3 Encounters:  05/17/17 251 lb (113.9 kg)  04/19/17 248 lb (112.5 kg)  04/18/17 248 lb 1.9 oz (112.5 kg)     GEN:  Well nourished, well developed in no acute distress HEENT: Normal NECK: No JVD; No carotid bruits LYMPHATICS: No lymphadenopathy CARDIAC: RRR, no murmurs, no rubs, no gallops RESPIRATORY:  Clear to auscultation without rales, wheezing or rhonchi  ABDOMEN: Soft, non-tender, non-distended MUSCULOSKELETAL:  No edema; No deformity  SKIN: Warm and dry LOWER EXTREMITIES: no swelling NEUROLOGIC:  Alert and oriented x 3 PSYCHIATRIC:  Normal affect   ASSESSMENT:    1. Essential hypertension   2. Atypical chest pain   3. Dyspnea on exertion   4. Abnormal stress test    PLAN:    In order of problems listed above:  1. Essential hypertension: Blood pressure appears to be well-controlled today. We'll continue present management, will opt metoprolol. 2. Chest pain with abnormal stress test. She elected to proceed with cardiac catheterization which we'll do. 3. Dyspnea on exertion: Cardiac catheterization will be done to clarify the diagnosis.   Medication Adjustments/Labs and Tests Ordered: Current medicines are reviewed at length with the patient today.  Concerns regarding medicines are outlined above.  No orders of the defined types were placed in this encounter.  Medication changes: No orders of the defined types were placed in this encounter.   Signed, Park Liter, MD, [facility] 05/17/2017 10:52 AM    [place]

## 2017-05-18 ENCOUNTER — Emergency Department (HOSPITAL_COMMUNITY)
Admission: EM | Admit: 2017-05-18 | Discharge: 2017-05-18 | Disposition: A | Discharge status: Home / Self Care | Payer: Medicare Other | Attending: Emergency Medicine | Admitting: Emergency Medicine

## 2017-05-18 ENCOUNTER — Encounter (HOSPITAL_COMMUNITY): Payer: Self-pay | Admitting: *Deleted

## 2017-05-18 DIAGNOSIS — Z87891 Personal history of nicotine dependence: Secondary | ICD-10-CM | POA: Diagnosis not present

## 2017-05-18 DIAGNOSIS — Z7982 Long term (current) use of aspirin: Secondary | ICD-10-CM | POA: Insufficient documentation

## 2017-05-18 DIAGNOSIS — R079 Chest pain, unspecified: Secondary | ICD-10-CM | POA: Diagnosis present

## 2017-05-18 DIAGNOSIS — Z79899 Other long term (current) drug therapy: Secondary | ICD-10-CM | POA: Diagnosis not present

## 2017-05-18 DIAGNOSIS — I10 Essential (primary) hypertension: Secondary | ICD-10-CM | POA: Insufficient documentation

## 2017-05-18 DIAGNOSIS — Z760 Encounter for issue of repeat prescription: Secondary | ICD-10-CM | POA: Diagnosis not present

## 2017-05-18 MED ORDER — METOPROLOL TARTRATE 25 MG PO TABS: 25.0000 mg | ORAL_TABLET | Freq: Two times a day (BID) | ORAL | 0 refills | Status: DC

## 2017-05-18 MED ORDER — NITROGLYCERIN 0.4 MG SL SUBL: 0.4000 mg | SUBLINGUAL_TABLET | SUBLINGUAL | 0 refills | Status: DC | PRN

## 2017-05-18 MED ORDER — ATORVASTATIN CALCIUM 10 MG PO TABS: 10.0000 mg | ORAL_TABLET | Freq: Every day | ORAL | 0 refills | Status: DC

## 2017-05-18 NOTE — Discharge Instructions (Signed)
I have written you the prescriptions that were prescribed to you by your cardiologist yesterday. These are paper prescriptions and he can take them to any pharmacy to get filled.   Please return to the emergency department if you experience worsening chest pain with shortness of breath at rest, chest pain with nausea and vomiting, chest pain that does not improve with nitroglycerin or any new or worsening symptoms that you may have

## 2017-05-18 NOTE — ED Notes (Signed)
Pt cursing at staff when trying to assess. Charge notified.

## 2017-05-18 NOTE — ED Notes (Signed)
ED Provider at bedside. 

## 2017-05-18 NOTE — ED Provider Notes (Signed)
[facility] DEPT Provider Note   CSN: [id number] Arrival date & time: 05/18/17  0848     History   Chief Complaint Chief Complaint  Patient presents with  . Medication Refill    HPI Cathy [name] is a 56 y.o. female.  HPI   Cathy [name] is a 56yo female with a history of hypertension who presents to the emergency department stating "I need you to write a paper prescription for my medications because the pharmacy does not have power." Patient has been followed by cardiology for the past 5 weeks with exertional chest pain and shortness of breath. She had a stress test which showed ischemia involving LAD territory and is scheduled for a cardiac catheterization 2 days from now. She was seen by Dr. Agustin Cree yesterday who prescribed her metoprolol, nitroglycerin, atorvastatin. Patient states that she went to [facility] to fill her prescriptions this morning, but was told that the power was out at that they could not access the electronic medical record system. Employees at [employer] told her that she needed to get a paper prescription from the emergency department. Patient states that she has 7/10 in severity chest pain which is exertional and also associated with shortness of breath. This has not changed over the past 5 weeks. She states that she has no new or worsening symptoms. States "I want to get my medications and get out of here." She denies nausea/vomiting, abdominal pain, shortness of breath, leg swelling at this time. She denies chest pain at rest but states that she has 7/10 in severity left sided sharp chest pain with exertion and this has been an ongoing problem for her for several weeks now.   Per records, she had a negative CT angiogram 9/14.  Past Medical History:  Diagnosis Date  . Back pain   . Hypertension     Patient Active Problem List   Diagnosis Date Noted  . Abnormal stress test 05/17/2017  . Atypical chest pain 04/18/2017  . Dyspnea on exertion 04/18/2017  .  Obstructive sleep apnea 04/18/2017  . Hypertension 04/08/2015  . Left shoulder pain 04/08/2015  . HYPOPOTASSEMIA 03/14/2009  . 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 BACK SURGERY    . KNEE SURGERY      OB History    No data available       Home Medications    Prior to Admission medications   Medication Sig Start Date End Date Taking? Authorizing Provider  aspirin EC 81 MG tablet Take 81 mg by mouth 2 (two) times daily.    [provider]  atorvastatin (LIPITOR) 10 MG tablet Take 1 tablet (10 mg total) by mouth daily. 05/17/17 08/15/17  Park Liter, MD  atorvastatin (LIPITOR) 10 MG tablet Take 1 tablet (10 mg total) by mouth daily. 05/18/17   Glyn Ade, PA-C  cloNIDine (CATAPRES) 0.1 MG tablet Take 0.1 mg by mouth 2 (two) times daily.    [provider]  cyclobenzaprine (FLEXERIL) 5 MG tablet Take 1 tablet (5 mg total) by mouth 3 (three) times daily as needed for muscle spasms. 01/14/17   Edrick Kins, DPM  DULoxetine (CYMBALTA) 20 MG capsule Take 20 mg by mouth daily.    [provider]  metoprolol tartrate (LOPRESSOR) 25 MG tablet Take 1 tablet (25 mg total) by mouth 2 (two) times daily. 05/17/17 08/15/17  Park Liter, MD  metoprolol tartrate (LOPRESSOR) 25 MG tablet Take  1 tablet (25 mg total) by mouth 2 (two) times daily. 05/18/17   Glyn Ade, PA-C  nitroGLYCERIN (NITROSTAT) 0.4 MG SL tablet Place 1 tablet (0.4 mg total) under the tongue every 5 (five) minutes as needed for chest pain. 05/17/17 08/15/17  Park Liter, MD  nitroGLYCERIN (NITROSTAT) 0.4 MG SL tablet Place 1 tablet (0.4 mg total) under the tongue every 5 (five) minutes as needed for chest pain. 05/18/17   Glyn Ade, PA-C  Pumpkin Seed-Soy Germ (AZO BLADDER CONTROL/GO-LESS PO) Take 1 tablet by mouth daily.    [provider]    Family History Family History    Problem Relation Age of Onset  . Stroke Mother     Social History Social History  Substance Use Topics  . Smoking status: Former Research scientist (life sciences)  . Smokeless tobacco: Never Used  . Alcohol use No     Allergies   Isovue [iopamidol]   Review of Systems Review of Systems  Constitutional: Negative for chills and fever.  Eyes: Negative for visual disturbance.  Respiratory: Positive for chest tightness. Negative for shortness of breath.   Cardiovascular: Negative for chest pain and palpitations.  Gastrointestinal: Negative for abdominal pain, diarrhea, nausea and vomiting.  Genitourinary: Negative for difficulty urinating.  Skin: Negative for rash and wound.  Neurological: Negative for dizziness, weakness, light-headedness, numbness and headaches.  Psychiatric/Behavioral: The patient is not nervous/anxious.      Physical Exam Updated Vital Signs BP (!) 168/104 (BP Location: Right Arm)   Pulse (!) 59   Temp 98.6 F (37 C) (Oral)   Resp 18   Ht 5\' 4"  (1.626 m)   Wt 113.4 kg (250 lb)   SpO2 100%   BMI 42.91 kg/m   Physical Exam  Constitutional: She is oriented to person, place, and time. She appears well-developed and well-nourished. No distress.  HENT:  Head: Normocephalic and atraumatic.  Mouth/Throat: Oropharynx is clear and moist. No oropharyngeal exudate.  Eyes: Pupils are equal, round, and reactive to light. Right eye exhibits no discharge. Left eye exhibits no discharge.  Neck: Normal range of motion. Neck supple.  Cardiovascular: Normal rate, regular rhythm and intact distal pulses.  Exam reveals no friction rub.   No murmur heard. Pulmonary/Chest: Effort normal and breath sounds normal. No respiratory distress. She has no wheezes. She has no rales. She exhibits no tenderness.  Abdominal: Soft. Bowel sounds are normal. There is no tenderness.  Musculoskeletal: Normal range of motion.  Neurological: She is alert and oriented to person, place, and time. Coordination  normal.  Skin: Skin is warm and dry.  Psychiatric: She has a normal mood and affect. Her behavior is normal.     ED Treatments / Results  Labs (all labs ordered are listed, but only abnormal results are displayed) Labs Reviewed - No data to display  EKG  EKG Interpretation None       Radiology No results found.  Procedures Procedures (including critical care time)  Medications Ordered in ED Medications - No data to display   Initial Impression / Assessment and Plan / ED Course  I have reviewed the triage vital signs and the nursing notes.  Pertinent labs & imaging results that were available during my care of the patient were reviewed by me and considered in my medical decision making (see chart for details).    Filled patients prescription for metoprolol, nitroglycerin and atorvastatin which were prescribed by her cardiologist yesterday. Patient's chest pain has not changed today from what  it has been over the past 5 weeks. Do not suspect ACS given patient's pain has not changed from prior when she had negative repeat troponin (04/16/2017). Do not suspect PE as patient has no new SOB and had negative CT angiogram (9/14).  She has been counseled extensively to return to the ER should she have any new or worsening chest pain or shortness of breath which is associated with diaphoresis or N/V. Patient's blood pressure was elevated in the ER today, discussed that she should have this rechecked at primary doctor's office. Patient agrees and voices understanding to the above plan. Discussed with Dr. Eulis Foster who agrees with plan to discharge.   Final Clinical Impressions(s) / ED Diagnoses   Final diagnoses:  Medication refill    New Prescriptions Discharge Medication List as of 05/18/2017 12:00 PM    START taking these medications   Details  !! atorvastatin (LIPITOR) 10 MG tablet Take 1 tablet (10 mg total) by mouth daily., Starting Sat 05/18/2017, Print    !! metoprolol  tartrate (LOPRESSOR) 25 MG tablet Take 1 tablet (25 mg total) by mouth 2 (two) times daily., Starting Sat 05/18/2017, Print    !! nitroGLYCERIN (NITROSTAT) 0.4 MG SL tablet Place 1 tablet (0.4 mg total) under the tongue every 5 (five) minutes as needed for chest pain., Starting Sat 05/18/2017, Print     !! - Potential duplicate medications found. Please discuss with provider.       Glyn Ade, PA-C 05/18/17 1759    Daleen Bo, MD 05/19/17 [phone]

## 2017-05-18 NOTE — ED Triage Notes (Signed)
Pt reports being scheduled for cath on Monday. Needs prescriptions for metoprolol, lipitor and nitro. Had already been given prescriptions but bc of power outage at pharmacy, they were unable to fill them. No other complaints.

## 2017-05-18 NOTE — ED Notes (Signed)
Pt states she needs her medications and has been unable to obtain them related to power outages in the area. Pt in room with significant other who is also being seen and refusing to go to individual room. EDP made aware and reports she will go see her next.

## 2017-05-19 LAB — BASIC METABOLIC PANEL
BUN / CREAT RATIO: 14 (ref 9–23)
BUN: 11 mg/dL (ref 6–24)
CO2: 25 mmol/L (ref 20–29)
CREATININE: 0.79 mg/dL (ref 0.57–1.00)
Calcium: 9.5 mg/dL (ref 8.7–10.2)
Chloride: 105 mmol/L (ref 96–106)
GFR calc Af Amer: 98 mL/min/{1.73_m2} (ref >59)
GFR, EST NON AFRICAN AMERICAN: 85 mL/min/{1.73_m2} (ref >59)
Glucose: 100 mg/dL — ABNORMAL HIGH (ref 65–99)
POTASSIUM: 3.8 mmol/L (ref 3.5–5.2)
SODIUM: 142 mmol/L (ref 134–144)

## 2017-05-20 ENCOUNTER — Ambulatory Visit (HOSPITAL_COMMUNITY)
Admission: RE | Admit: 2017-05-20 | Discharge: 2017-05-20 | Disposition: A | Discharge status: Home / Self Care | Payer: Medicare Other | Source: Ambulatory Visit | Attending: Cardiology | Admitting: Cardiology

## 2017-05-20 ENCOUNTER — Other Ambulatory Visit: Payer: Self-pay

## 2017-05-20 ENCOUNTER — Encounter (HOSPITAL_COMMUNITY)
Admission: RE | Disposition: A | Discharge status: Home / Self Care | Payer: Self-pay | Source: Ambulatory Visit | Attending: Cardiology

## 2017-05-20 DIAGNOSIS — R9439 Abnormal result of other cardiovascular function study: Secondary | ICD-10-CM | POA: Diagnosis not present

## 2017-05-20 DIAGNOSIS — I10 Essential (primary) hypertension: Secondary | ICD-10-CM | POA: Insufficient documentation

## 2017-05-20 DIAGNOSIS — Z87891 Personal history of nicotine dependence: Secondary | ICD-10-CM | POA: Diagnosis not present

## 2017-05-20 DIAGNOSIS — I771 Stricture of artery: Secondary | ICD-10-CM | POA: Insufficient documentation

## 2017-05-20 DIAGNOSIS — R0789 Other chest pain: Secondary | ICD-10-CM | POA: Insufficient documentation

## 2017-05-20 HISTORY — PX: LEFT HEART CATH AND CORONARY ANGIOGRAPHY: CATH118249

## 2017-05-20 LAB — CBC
HCT: 38.1 % (ref 36.0–46.0)
HEMOGLOBIN: 11.4 g/dL — AB (ref 12.0–15.0)
MCH: 27 pg (ref 26.0–34.0)
MCHC: 29.9 g/dL — ABNORMAL LOW (ref 30.0–36.0)
MCV: 90.1 fL (ref 78.0–100.0)
Platelets: 266 10*3/uL (ref 150–400)
RBC: 4.23 MIL/uL (ref 3.87–5.11)
RDW: 16.2 % — ABNORMAL HIGH (ref 11.5–15.5)
WBC: 4.9 10*3/uL (ref 4.0–10.5)

## 2017-05-20 LAB — PROTIME-INR
INR: 0.9
PROTHROMBIN TIME: 12.1 s (ref 11.4–15.2)

## 2017-05-20 SURGERY — LEFT HEART CATH AND CORONARY ANGIOGRAPHY
Anesthesia: LOCAL

## 2017-05-20 MED ORDER — ASPIRIN 81 MG PO CHEW: 81.0000 mg | CHEWABLE_TABLET | ORAL | Status: DC

## 2017-05-20 MED ORDER — FENTANYL CITRATE (PF) 100 MCG/2ML IJ SOLN
INTRAMUSCULAR | Status: DC | PRN
  Administered 2017-05-20: 25 ug via INTRAVENOUS

## 2017-05-20 MED ORDER — SODIUM CHLORIDE 0.9% FLUSH: 3.0000 mL | Freq: Two times a day (BID) | INTRAVENOUS | Status: DC

## 2017-05-20 MED ORDER — MIDAZOLAM HCL 2 MG/2ML IJ SOLN
INTRAMUSCULAR | Status: AC
  Filled 2017-05-20: qty 2

## 2017-05-20 MED ORDER — SODIUM CHLORIDE 0.9% FLUSH: 3.0000 mL | INTRAVENOUS | Status: DC | PRN

## 2017-05-20 MED ORDER — LIDOCAINE HCL 2 % IJ SOLN
INTRAMUSCULAR | Status: DC | PRN
  Administered 2017-05-20: 2 mL

## 2017-05-20 MED ORDER — HEPARIN SODIUM (PORCINE) 1000 UNIT/ML IJ SOLN
INTRAMUSCULAR | Status: AC
Start: 2017-05-20 — End: ?
  Filled 2017-05-20: qty 1

## 2017-05-20 MED ORDER — MIDAZOLAM HCL 2 MG/2ML IJ SOLN
INTRAMUSCULAR | Status: DC | PRN
  Administered 2017-05-20: 1 mg via INTRAVENOUS

## 2017-05-20 MED ORDER — SODIUM CHLORIDE 0.9 % IV SOLN: 250.0000 mL | INTRAVENOUS | Status: DC | PRN

## 2017-05-20 MED ORDER — SODIUM CHLORIDE 0.9 % WEIGHT BASED INFUSION
3.0000 mL/kg/h | INTRAVENOUS | Status: AC
  Administered 2017-05-20: 3 mL/kg/h via INTRAVENOUS

## 2017-05-20 MED ORDER — METOPROLOL TARTRATE 25 MG PO TABS: 25.0000 mg | ORAL_TABLET | Freq: Two times a day (BID) | ORAL | 3 refills | Status: DC

## 2017-05-20 MED ORDER — ONDANSETRON HCL 4 MG/2ML IJ SOLN: 4.0000 mg | Freq: Four times a day (QID) | INTRAMUSCULAR | Status: DC | PRN

## 2017-05-20 MED ORDER — DIPHENHYDRAMINE HCL 50 MG/ML IJ SOLN
25.0000 mg | Freq: Once | INTRAMUSCULAR | Status: AC
  Administered 2017-05-20: 25 mg via INTRAVENOUS

## 2017-05-20 MED ORDER — METHYLPREDNISOLONE SODIUM SUCC 125 MG IJ SOLR
INTRAMUSCULAR | Status: AC
  Administered 2017-05-20: 125 mg via INTRAVENOUS
  Filled 2017-05-20: qty 2

## 2017-05-20 MED ORDER — HEPARIN (PORCINE) IN NACL 2-0.9 UNIT/ML-% IJ SOLN
INTRAMUSCULAR | Status: AC
  Filled 2017-05-20: qty 1000

## 2017-05-20 MED ORDER — VERAPAMIL HCL 2.5 MG/ML IV SOLN
INTRAVENOUS | Status: AC
  Filled 2017-05-20: qty 2

## 2017-05-20 MED ORDER — SODIUM CHLORIDE 0.9 % IV SOLN: INTRAVENOUS | Status: DC

## 2017-05-20 MED ORDER — SODIUM CHLORIDE 0.9 % WEIGHT BASED INFUSION: 1.0000 mL/kg/h | INTRAVENOUS | Status: DC

## 2017-05-20 MED ORDER — HEPARIN (PORCINE) IN NACL 2-0.9 UNIT/ML-% IJ SOLN
INTRAMUSCULAR | Status: AC | PRN
  Administered 2017-05-20: 1000 mL

## 2017-05-20 MED ORDER — DIPHENHYDRAMINE HCL 50 MG/ML IJ SOLN
INTRAMUSCULAR | Status: AC
  Administered 2017-05-20: 25 mg via INTRAVENOUS
  Filled 2017-05-20: qty 1

## 2017-05-20 MED ORDER — LIDOCAINE HCL (PF) 1 % IJ SOLN: INTRAMUSCULAR | Status: DC | PRN

## 2017-05-20 MED ORDER — VERAPAMIL HCL 2.5 MG/ML IV SOLN
INTRAVENOUS | Status: DC | PRN
  Administered 2017-05-20: 10 mL via INTRA_ARTERIAL

## 2017-05-20 MED ORDER — LIDOCAINE HCL 2 % IJ SOLN
INTRAMUSCULAR | Status: AC
  Filled 2017-05-20: qty 10

## 2017-05-20 MED ORDER — FENTANYL CITRATE (PF) 100 MCG/2ML IJ SOLN
INTRAMUSCULAR | Status: AC
  Filled 2017-05-20: qty 2

## 2017-05-20 MED ORDER — ACETAMINOPHEN 325 MG PO TABS
650.0000 mg | ORAL_TABLET | ORAL | Status: DC | PRN
Start: 2017-05-20 — End: 2017-05-20

## 2017-05-20 MED ORDER — IODIXANOL 320 MG/ML IV SOLN
INTRAVENOUS | Status: DC | PRN
  Administered 2017-05-20: 60 mL via INTRAVENOUS

## 2017-05-20 MED ORDER — METHYLPREDNISOLONE SODIUM SUCC 125 MG IJ SOLR
125.0000 mg | Freq: Once | INTRAMUSCULAR | Status: AC
  Administered 2017-05-20: 125 mg via INTRAVENOUS

## 2017-05-20 MED ORDER — ATORVASTATIN CALCIUM 10 MG PO TABS: 10.0000 mg | ORAL_TABLET | Freq: Every day | ORAL | 3 refills | Status: DC

## 2017-05-20 MED ORDER — HEPARIN SODIUM (PORCINE) 1000 UNIT/ML IJ SOLN
INTRAMUSCULAR | Status: DC | PRN
  Administered 2017-05-20: 6000 [IU] via INTRAVENOUS

## 2017-05-20 MED ORDER — NITROGLYCERIN 0.4 MG SL SUBL: 0.4000 mg | SUBLINGUAL_TABLET | SUBLINGUAL | 11 refills | Status: DC | PRN

## 2017-05-20 SURGICAL SUPPLY — 9 items
CATH OPTITORQUE TIG 4.0 5F (CATHETERS) ×2 IMPLANT
DEVICE RAD COMP TR BAND LRG (VASCULAR PRODUCTS) ×2 IMPLANT
GLIDESHEATH SLEND A-KIT 6F 22G (SHEATH) ×2 IMPLANT
GUIDEWIRE INQWIRE 1.5J.035X260 (WIRE) ×1 IMPLANT
INQWIRE 1.5J .035X260CM (WIRE) ×2
KIT HEART LEFT (KITS) ×2 IMPLANT
PACK CARDIAC CATHETERIZATION (CUSTOM PROCEDURE TRAY) ×2 IMPLANT
TRANSDUCER W/STOPCOCK (MISCELLANEOUS) ×2 IMPLANT
TUBING CIL FLEX 10 FLL-RA (TUBING) ×2 IMPLANT

## 2017-05-20 NOTE — H&P (Signed)
Cardiology Office Note:    Date:  05/17/2017   ID:  Cathy [name], DOB 10-10-1960, MRN [id number]  PCP:  Patient, No Pcp Per  Cardiologist:  Jenne Campus, MD    Referring MD: Park Liter, MD   Chief Complaint  Patient presents with  . Follow-up    1 month flup after echo and lexiscan stress test   . Chest Pain  . Shortness of Breath  And still having chest pain  History of Present Illness:    Cathy [name] is a 56 y.o. female  with chest pain which initially looked very atypical however today she tells me that her chest tightness tightness like that happens when she walks necessary with shortness of breath and sweating. On top of that she said that she's getting the sensation when she gets argument with somebody. She did have a stress test which showed ischemia involving LAD territory. A supportive evaluation for atypical chest pain also be d-dimer which was abnormal CT of her chest was done which showed no evidence of PE. We talked in length about what to do with the situation we discussed basically 2 options option #1 treating this with medications option #2 cardiac catheterization she clearly prefers cardiac catheterization. The procedure was explained to her including risks benefits as well as alternatives. She agreed to proceed. In the meantime I will give her a small dose of metoprolol, will continue with aspirin, I will add statin.  Past Medical History:  Diagnosis Date  . Back pain   . Hypertension     Past Surgical History:  Procedure Laterality Date  . BACK SURGERY    . KNEE SURGERY      Current Medications: Current Meds  Medication Sig  . cloNIDine (CATAPRES) 0.1 MG tablet Take 0.1 mg by mouth 2 (two) times daily.  . cyclobenzaprine (FLEXERIL) 5 MG tablet Take 1 tablet (5 mg total) by mouth 3 (three) times daily as needed for muscle spasms.  . DULoxetine (CYMBALTA) 20 MG capsule Take 20 mg by mouth daily.   Current Facility-Administered  Medications for the 05/17/17 encounter (Office Visit) with Park Liter, MD  Medication  . betamethasone acetate-betamethasone sodium phosphate (CELESTONE) injection 3 mg  . betamethasone acetate-betamethasone sodium phosphate (CELESTONE) injection 3 mg  . betamethasone acetate-betamethasone sodium phosphate (CELESTONE) injection 3 mg     Allergies:   Isovue [iopamidol]   Social History   Social History  . Marital status: Divorced    Spouse name: N/A  . Number of children: N/A  . Years of education: N/A   Social History Main Topics  . Smoking status: Former Research scientist (life sciences)  . Smokeless tobacco: Never Used  . Alcohol use No  . Drug use: No  . Sexual activity: Not Asked   Other Topics Concern  . None   Social History Narrative  . None     Family History: The patient's family history includes Stroke in her mother. ROS:   Please see the history of present illness.    All 14 point review of systems negative except as described per history of present illness  EKGs/Labs/Other Studies Reviewed:      Recent Labs: 04/15/2017: BUN 12; Creatinine, Ser 0.81; Hemoglobin 11.9; Platelets 260; Potassium 4.0; Sodium 135  Recent Lipid Panel    Component Value Date/Time   CHOL  03/07/2009 0902    164        ATP III CLASSIFICATION:  <200     mg/dL   Desirable  200-239  mg/dL   Borderline High  >=240    mg/dL   High          TRIG 48 03/07/2009 0902   HDL 72 03/07/2009 0902   CHOLHDL 2.3 03/07/2009 0902   VLDL 10 03/07/2009 0902   LDLCALC  03/07/2009 0902    82        Total Cholesterol/HDL:CHD Risk Coronary Heart Disease Risk Table                     Men   Women  1/2 Average Risk   3.4   3.3  Average Risk       5.0   4.4  2 X Average Risk   9.6   7.1  3 X Average Risk  23.4   11.0        Use the calculated Patient Ratio above and the CHD Risk Table to determine the patient's CHD Risk.        ATP III CLASSIFICATION (LDL):  <100     mg/dL   Optimal  100-129  mg/dL   Near  or Above                    Optimal  130-159  mg/dL   Borderline  160-189  mg/dL   High  >190     mg/dL   Very High    Physical Exam:    VS:  BP (!) 168/108 (BP Location: Right Arm, Patient Position: Sitting)   Pulse 86   Ht 5\' 4"  (1.626 m)   Wt 251 lb (113.9 kg)   SpO2 97%   BMI 43.08 kg/m     Wt Readings from Last 3 Encounters:  05/17/17 251 lb (113.9 kg)  04/19/17 248 lb (112.5 kg)  04/18/17 248 lb 1.9 oz (112.5 kg)     GEN:  Well nourished, well developed in no acute distress HEENT: Normal NECK: No JVD; No carotid bruits LYMPHATICS: No lymphadenopathy CARDIAC: RRR, no murmurs, no rubs, no gallops RESPIRATORY:  Clear to auscultation without rales, wheezing or rhonchi  ABDOMEN: Soft, non-tender, non-distended MUSCULOSKELETAL:  No edema; No deformity  SKIN: Warm and dry LOWER EXTREMITIES: no swelling NEUROLOGIC:  Alert and oriented x 3 PSYCHIATRIC:  Normal affect   ASSESSMENT:    1. Essential hypertension   2. Atypical chest pain   3. Dyspnea on exertion   4. Abnormal stress test    PLAN:    In order of problems listed above:  1. Essential hypertension: Blood pressure appears to be well-controlled today. We'll continue present management, will opt metoprolol. 2. Chest pain with abnormal stress test. She elected to proceed with cardiac catheterization which we'll do. 3. Dyspnea on exertion: Cardiac catheterization will be done to clarify the diagnosis.   Medication Adjustments/Labs and Tests Ordered: Current medicines are reviewed at length with the patient today.  Concerns regarding medicines are outlined above.  No orders of the defined types were placed in this encounter.  Medication changes: No orders of the defined types were placed in this encounter.   Signed, Park Liter, MD, [id number] [facility] 05/17/2017 10:52 AM    [place]

## 2017-05-20 NOTE — Interval H&P Note (Signed)
History and Physical Interval Note:  05/20/2017 7:17 AM  Cathy [name]  has presented today for surgery, with the diagnosis of cp  abn stress.   The various methods of treatment have been discussed with the patient and family. After consideration of risks, benefits and other options for treatment, the patient has consented to  Procedure(s): LEFT HEART CATH AND CORONARY ANGIOGRAPHY (N/A) with possible PERCUTANEOUS CORONARY INTERVENTION as a surgical intervention .  The patient's history has been reviewed, patient examined, no change in status, stable for surgery.  I have reviewed the patient's chart and labs.  Questions were answered to the patient's satisfaction.    Cath Lab Visit (complete for each Cath Lab visit)  Clinical Evaluation Leading to the Procedure:   ACS: No.  Non-ACS:    Anginal Classification: CCS III  Anti-ischemic medical therapy: No Therapy  Non-Invasive Test Results: Intermediate-risk stress test findings: cardiac mortality 1-3%/year  Prior CABG: No previous CABG   Glenetta Hew

## 2017-05-20 NOTE — Interval H&P Note (Signed)
History and Physical Interval Note:  05/20/2017 7:19 AM  Cathy [name]  has presented today for surgery, with the diagnosis of cp  abn stress  The various methods of treatment have been discussed with the patient and family. After consideration of risks, benefits and other options for treatment, the patient has consented to  Procedure(s): LEFT HEART CATH AND CORONARY ANGIOGRAPHY (N/A) as a surgical intervention .  The patient's history has been reviewed, patient examined, no change in status, stable for surgery.  I have reviewed the patient's chart and labs.  Questions were answered to the patient's satisfaction.     Glenetta Hew

## 2017-05-20 NOTE — Discharge Instructions (Signed)

## 2017-05-21 ENCOUNTER — Encounter (HOSPITAL_COMMUNITY): Payer: Self-pay | Admitting: Cardiology

## 2017-05-27 ENCOUNTER — Telehealth: Payer: Self-pay | Admitting: Cardiology

## 2017-05-27 NOTE — Telephone Encounter (Signed)
°*  STAT* If patient is at the pharmacy, call can be transferred to refill team.   1. Which medications need to be refilled? (please list name of each medication and dose if known) Metoprolol 25mg  take 1 tablet twice daily  2. Which pharmacy/location (including street and city if local pharmacy) is medication to be sent to? [facility] in [place] on [address]...  3. Do they need a 30 day or 90 day supply? 60  Patient has ran out and not due back in til 11/2

## 2017-05-28 MED ORDER — METOPROLOL TARTRATE 25 MG PO TABS: 25.0000 mg | ORAL_TABLET | Freq: Two times a day (BID) | ORAL | 3 refills | Status: DC

## 2017-05-28 NOTE — Telephone Encounter (Signed)
Resent to correct pharmacy.

## 2017-06-07 ENCOUNTER — Encounter: Payer: Self-pay | Admitting: Cardiology

## 2017-06-07 ENCOUNTER — Ambulatory Visit (INDEPENDENT_AMBULATORY_CARE_PROVIDER_SITE_OTHER): Payer: Medicare Other | Admitting: Cardiology

## 2017-06-07 VITALS — BP 140/90 | HR 60 | Resp 12 | Ht 64.0 in | Wt 252.1 lb

## 2017-06-07 DIAGNOSIS — R0609 Other forms of dyspnea: Secondary | ICD-10-CM

## 2017-06-07 DIAGNOSIS — R0789 Other chest pain: Secondary | ICD-10-CM

## 2017-06-07 DIAGNOSIS — I10 Essential (primary) hypertension: Secondary | ICD-10-CM

## 2017-06-07 DIAGNOSIS — R9439 Abnormal result of other cardiovascular function study: Secondary | ICD-10-CM | POA: Diagnosis not present

## 2017-06-07 NOTE — Progress Notes (Signed)
Cardiology Office Note:    Date:  06/07/2017   ID:  Cathy [name], DOB 06/30/1961, MRN [id number]  PCP:  Patient, No Pcp Per  Cardiologist:  Jenne Campus, MD    Referring MD: No ref. provider found   Chief Complaint  Patient presents with  . Follow up on Cath  Doing dramatically better  History of Present Illness:    Cathy [name] is a 56 y.o. female with multiple risk factors for coronary artery disease, she had stress test which showed ischemia involving the LAD territory followed by cardiac catheterization which showed normal coronary artery disease.  There was a mild spasm being induced by catheter with reduction to 30% but otherwise normal.  She says she feels dramatically better after cardiac catheterization the matter-of-fact when I walk into the room she asked me what to do due to my heart that I feel so much better.  I explained to her that her coronary arteries were normal.  I also told her that what we need to the right now is modified her risk factors for coronary artery disease.Cathy [name]  Her blood pressure appears to be mildly elevated but she got upset today at school.  She does have blood pressure monitor asked her to check on a regular basis.  Otherwise I will check her fasting lipid profile in about a month from today and see her back in the office in about 5 months.  Past Medical History:  Diagnosis Date  . Back pain   . Hypertension     Past Surgical History:  Procedure Laterality Date  . BACK SURGERY    . KNEE SURGERY    . LEFT HEART CATH AND CORONARY ANGIOGRAPHY N/A 05/20/2017   Procedure: LEFT HEART CATH AND CORONARY ANGIOGRAPHY;  Surgeon: Leonie Man, MD;  Location: [facility];  Service: Cardiovascular;  Laterality: N/A;    Current Medications: Current Meds  Medication Sig  . aspirin EC 81 MG tablet Take 81 mg by mouth 2 (two) times daily.  Cathy [name] atorvastatin (LIPITOR) 10 MG tablet Take 1 tablet (10 mg total) by mouth daily.  . cloNIDine (CATAPRES)  0.1 MG tablet Take 0.1 mg by mouth 2 (two) times daily.  . DULoxetine (CYMBALTA) 20 MG capsule Take 20 mg by mouth daily.  . metoprolol tartrate (LOPRESSOR) 25 MG tablet Take 1 tablet (25 mg total) by mouth 2 (two) times daily.  . nitroGLYCERIN (NITROSTAT) 0.4 MG SL tablet Place 1 tablet (0.4 mg total) under the tongue every 5 (five) minutes as needed for chest pain.  Cathy [name] Pumpkin Seed-Soy Germ (AZO BLADDER CONTROL/GO-LESS PO) Take 1 tablet by mouth daily.   Current Facility-Administered Medications for the 06/07/17 encounter (Office Visit) with Park Liter, MD  Medication  . betamethasone acetate-betamethasone sodium phosphate (CELESTONE) injection 3 mg  . betamethasone acetate-betamethasone sodium phosphate (CELESTONE) injection 3 mg  . betamethasone acetate-betamethasone sodium phosphate (CELESTONE) injection 3 mg     Allergies:   Isovue [iopamidol]   Social History   Social History  . Marital status: Divorced    Spouse name: N/A  . Number of children: N/A  . Years of education: N/A   Social History Main Topics  . Smoking status: Former Research scientist (life sciences)  . Smokeless tobacco: Never Used  . Alcohol use No  . Drug use: No  . Sexual activity: Not Asked   Other Topics Concern  . None   Social History Narrative  . None     Family History: The patient's family history  includes Stroke in her mother. ROS:   Please see the history of present illness.    All 14 point review of systems negative except as described per history of present illness  EKGs/Labs/Other Studies Reviewed:      Recent Labs: 05/17/2017: BUN 11; Creatinine, Ser 0.79; Potassium 3.8; Sodium 142 05/20/2017: Hemoglobin 11.4; Platelets 266  Recent Lipid Panel    Component Value Date/Time   CHOL  03/07/2009 0902    164        ATP III CLASSIFICATION:  <200     mg/dL   Desirable  200-239  mg/dL   Borderline High  >=240    mg/dL   High          TRIG 48 03/07/2009 0902   HDL 72 03/07/2009 0902   CHOLHDL 2.3  03/07/2009 0902   VLDL 10 03/07/2009 0902   LDLCALC  03/07/2009 0902    82        Total Cholesterol/HDL:CHD Risk Coronary Heart Disease Risk Table                     Men   Women  1/2 Average Risk   3.4   3.3  Average Risk       5.0   4.4  2 X Average Risk   9.6   7.1  3 X Average Risk  23.4   11.0        Use the calculated Patient Ratio above and the CHD Risk Table to determine the patient's CHD Risk.        ATP III CLASSIFICATION (LDL):  <100     mg/dL   Optimal  100-129  mg/dL   Near or Above                    Optimal  130-159  mg/dL   Borderline  160-189  mg/dL   High  >190     mg/dL   Very High    Physical Exam:    VS:  BP 140/90   Pulse 60   Resp 12   Ht 5\' 4"  (1.626 m)   Wt 252 lb 1.9 oz (114.4 kg)   BMI 43.28 kg/m     Wt Readings from Last 3 Encounters:  06/07/17 252 lb 1.9 oz (114.4 kg)  05/20/17 250 lb (113.4 kg)  05/18/17 250 lb (113.4 kg)     GEN:  Well nourished, well developed in no acute distress HEENT: Normal NECK: No JVD; No carotid bruits LYMPHATICS: No lymphadenopathy CARDIAC: RRR, no murmurs, no rubs, no gallops RESPIRATORY:  Clear to auscultation without rales, wheezing or rhonchi  ABDOMEN: Soft, non-tender, non-distended MUSCULOSKELETAL:  No edema; No deformity  SKIN: Warm and dry LOWER EXTREMITIES: no swelling NEUROLOGIC:  Alert and oriented x 3 PSYCHIATRIC:  Normal affect   ASSESSMENT:    1. Essential hypertension   2. Abnormal stress test   3. Atypical chest pain   4. Dyspnea on exertion    PLAN:    In order of problems listed above:  1. Essential hypertension: Blood pressure mildly elevated but she has blood pressure monitor at home and will check her blood pressure and regular basis will bring results to me in about a month.  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 2. Abnormal stress test: Cardiac catheterization was normal 3. Atypical chest pain: Improved  dramatically after cardiac catheterization 4. Dyspnea on exertion: Doing  well.   Medication Adjustments/Labs and Tests Ordered: Current medicines are reviewed at length with the patient today.  Concerns regarding medicines are outlined above.  No orders of the defined types were placed in this encounter.  Medication changes: No orders of the defined types were placed in this encounter.   Signed, Park Liter, MD, [facility] 06/07/2017 10:24 AM    [place]

## 2017-06-07 NOTE — Patient Instructions (Signed)
Medication Instructions:  Your physician recommends that you continue on your current medications as directed. Please refer to the Current Medication list given to you today.  Labwork: Your physician recommends that you return for a FASTING lipid profile: 1 month. You may swing by our office M-F 8-11:30 or 1:00-4:30.  Testing/Procedures: None   Follow-Up: Your physician wants you to follow-up in: 5 months. You will receive a reminder letter in the mail two months in advance. If you don't receive a letter, please call our office to schedule the follow-up appointment.  Any Other Special Instructions Will Be Listed Below (If Applicable).  Please note that any paperwork needing to be filled out by the provider will need to be addressed at the front desk prior to seeing the provider. Please note that any paperwork FMLA, Disability or other documents regarding health condition is subject to a $25.00 charge that must be received prior to completion of paperwork in the form of a money order or check.     If you need a refill on your cardiac medications before your next appointment, please call your pharmacy.

## 2017-07-16 ENCOUNTER — Emergency Department (HOSPITAL_COMMUNITY)
Admission: EM | Admit: 2017-07-16 | Discharge: 2017-07-16 | Disposition: A | Discharge status: Home / Self Care | Payer: Medicare Other | Attending: Emergency Medicine | Admitting: Emergency Medicine

## 2017-07-16 ENCOUNTER — Emergency Department (HOSPITAL_COMMUNITY): Payer: Medicare Other

## 2017-07-16 ENCOUNTER — Encounter (HOSPITAL_COMMUNITY): Payer: Self-pay

## 2017-07-16 ENCOUNTER — Other Ambulatory Visit: Payer: Self-pay

## 2017-07-16 DIAGNOSIS — R05 Cough: Secondary | ICD-10-CM | POA: Diagnosis present

## 2017-07-16 DIAGNOSIS — J069 Acute upper respiratory infection, unspecified: Secondary | ICD-10-CM | POA: Diagnosis not present

## 2017-07-16 DIAGNOSIS — Z7982 Long term (current) use of aspirin: Secondary | ICD-10-CM | POA: Insufficient documentation

## 2017-07-16 DIAGNOSIS — Z79899 Other long term (current) drug therapy: Secondary | ICD-10-CM | POA: Diagnosis not present

## 2017-07-16 DIAGNOSIS — I10 Essential (primary) hypertension: Secondary | ICD-10-CM | POA: Diagnosis not present

## 2017-07-16 DIAGNOSIS — F17200 Nicotine dependence, unspecified, uncomplicated: Secondary | ICD-10-CM | POA: Diagnosis not present

## 2017-07-16 MED ORDER — ALBUTEROL SULFATE HFA 108 (90 BASE) MCG/ACT IN AERS: 1.0000 | INHALATION_SPRAY | Freq: Four times a day (QID) | RESPIRATORY_TRACT | 0 refills | Status: DC | PRN

## 2017-07-16 MED ORDER — BENZONATATE 100 MG PO CAPS: 100.0000 mg | ORAL_CAPSULE | Freq: Three times a day (TID) | ORAL | 0 refills | Status: DC

## 2017-07-16 MED ORDER — DEXAMETHASONE SODIUM PHOSPHATE 10 MG/ML IJ SOLN
10.0000 mg | Freq: Once | INTRAMUSCULAR | Status: AC
  Administered 2017-07-16: 10 mg via INTRAMUSCULAR
  Filled 2017-07-16: qty 1

## 2017-07-16 NOTE — ED Triage Notes (Signed)
Pt reports productive cough x 3 weeks with green mucous that is blood tinged at times. Pt denies chest pain.

## 2017-07-16 NOTE — Discharge Instructions (Signed)
It was my pleasure taking care of you today!   Fortunately, we did not see evidence of serious infection and can treat your symptoms. Tessalon as needed for cough at night. Inhaler every 4-6 hours as needed for cough.   Rest, drink plenty of fluids to be sure you are staying hydrated.   Please follow up with your primary doctor for discussion of your diagnoses and further evaluation after today's visit if symptoms persist longer than 7 days. If you do not have one, see the information below. Return to the ER for high fevers, difficulty breathing or other concerning symptoms  To find a primary care or specialty doctor please call [phone] or [phone] to access "[place] a Doctor Service."  You may also go on the [facility] website at [email or web address]  There are also multiple [facility], [facility] and [facility] practices throughout the [place] that are frequently accepting new patients. You may find a clinic that is close to your home and contact them.  [place] [address] [phone]  [facility] in [place] (also locations in [place] and [place]) - [place] [phone]  [place] [phone]

## 2017-07-16 NOTE — ED Provider Notes (Signed)
[facility] EMERGENCY DEPARTMENT Provider Note   CSN: [id number] Arrival date & time: 07/16/17  1653     History   Chief Complaint Chief Complaint  Patient presents with  . Cough    HPI Cathy [name] is a 56 y.o. female.  The history is provided by the patient and medical records. No language interpreter was used.  Cough  Pertinent negatives include no chest pain, no chills, no sore throat, no myalgias, no shortness of breath and no wheezing.   Cathy [name] is a 56 y.o. female  with a PMH of HTN who presents to the Emergency Department complaining of cough, intermittently productive of yellow phlegm x 3 weeks. Cough worse at night. Denies associated symptoms. No nasal congestion, fever, chills, body aches, sore throat, sinus pressure, shortness of breath, chest pain, abdominal pain, nausea, vomiting, diarrhea.  She has tried multiple over-the-counter cold and cough medications with little improvement.  Denies history of similar, stating she typically will get a cold for a few days and then it will resolve. No known sick contacts. Not a smoker.  Past Medical History:  Diagnosis Date  . Back pain   . Hypertension     Patient Active Problem List   Diagnosis Date Noted  . Abnormal stress test, catheterization done after that showed normal coronaries 05/17/2017  . Atypical chest pain 04/18/2017  . Dyspnea on exertion 04/18/2017  . Obstructive sleep apnea 04/18/2017  . Hypertension 04/08/2015  . Left shoulder pain 04/08/2015  . HYPOPOTASSEMIA 03/14/2009  . 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 BACK SURGERY    . KNEE SURGERY    . LEFT HEART CATH AND CORONARY ANGIOGRAPHY N/A 05/20/2017   Procedure: LEFT HEART CATH AND CORONARY ANGIOGRAPHY;  Surgeon: Leonie Man, MD;  Location: [facility];  Service: Cardiovascular;  Laterality: N/A;    OB History    No data available       Home Medications    Prior to Admission medications   Medication Sig Start Date End Date Taking? Authorizing Provider  albuterol (PROVENTIL HFA;VENTOLIN HFA) 108 (90 Base) MCG/ACT inhaler Inhale 1-2 puffs into the lungs every 6 (six) hours as needed for wheezing or shortness of breath. 07/16/17   Cathy [name], Ozella Almond, PA-C  aspirin EC 81 MG tablet Take 81 mg by mouth 2 (two) times daily.    [provider]  atorvastatin (LIPITOR) 10 MG tablet Take 1 tablet (10 mg total) by mouth daily. 05/20/17 08/18/17  Park Liter, MD  benzonatate (TESSALON) 100 MG capsule Take 1 capsule (100 mg total) by mouth every 8 (eight) hours. 07/16/17   Cathy [name], Ozella Almond, PA-C  cloNIDine (CATAPRES) 0.1 MG tablet Take 0.1 mg by mouth 2 (two) times daily.    [provider]  DULoxetine (CYMBALTA) 20 MG capsule Take 20 mg by mouth daily.    [provider]  metoprolol tartrate (LOPRESSOR) 25 MG tablet Take 1 tablet (25 mg total) by mouth 2 (two) times daily. 05/28/17 08/26/17  Park Liter, MD  nitroGLYCERIN (NITROSTAT) 0.4 MG SL tablet Place 1 tablet (0.4 mg total) under the tongue every 5 (five) minutes as needed for chest pain. 05/20/17 08/18/17  Park Liter, MD  Pumpkin Seed-Soy Germ (AZO BLADDER CONTROL/GO-LESS PO) Take 1 tablet by mouth daily.    [provider]    Family History Family History  Problem Relation Age of Onset  .  Stroke Mother     Social History Social History   Tobacco Use  . Smoking status: Former Research scientist (life sciences)  . Smokeless tobacco: Never Used  Substance Use Topics  . Alcohol use: No  . Drug use: No     Allergies   Isovue [iopamidol]   Review of Systems Review of Systems  Constitutional: Negative for chills and fever.  HENT: Negative for congestion, sinus pressure, sinus pain, sore throat and trouble swallowing.   Respiratory: Positive for cough. Negative for shortness of breath and wheezing.   Cardiovascular: Negative for  chest pain.  Gastrointestinal: Negative for abdominal pain, diarrhea, nausea and vomiting.  Genitourinary: Negative for difficulty urinating and dysuria.  Musculoskeletal: Negative for arthralgias and myalgias.     Physical Exam Updated Vital Signs BP (!) 164/90 (BP Location: Right Arm)   Pulse (!) 107   Temp 99.6 F (37.6 C) (Oral)   Resp 18   Ht 5\' 4"  (1.626 m)   Wt 113.4 kg (250 lb)   SpO2 96%   BMI 42.91 kg/m   Physical Exam  Constitutional: She appears well-developed and well-nourished. No distress.  HENT:  Head: Normocephalic and atraumatic.  OP with mild erythema, no exudates or tonsillar hypertrophy.  No sinus tenderness.  Neck: Neck supple.  Cardiovascular: Normal rate, regular rhythm and normal heart sounds.  No murmur heard. Pulmonary/Chest: Effort normal and breath sounds normal. No stridor. No respiratory distress. She has no wheezes. She has no rales. She exhibits tenderness.  Lungs CTA bilaterally.   Musculoskeletal: Normal range of motion.  Neurological: She is alert.  Skin: Skin is warm and dry.  Nursing note and vitals reviewed.    ED Treatments / Results  Labs (all labs ordered are listed, but only abnormal results are displayed) Labs Reviewed - No data to display  EKG  EKG Interpretation None       Radiology Dg Chest 2 View  Result Date: 07/16/2017 CLINICAL DATA:  Cough EXAM: CHEST  2 VIEW COMPARISON:  Chest CT 04/19/2017 FINDINGS: The heart size and mediastinal contours are within normal limits. Both lungs are clear. The visualized skeletal structures are unremarkable. IMPRESSION: No active cardiopulmonary disease. Electronically Signed   By: Ulyses Jarred M.D.   On: 07/16/2017 18:02    Procedures Procedures (including critical care time)  Medications Ordered in ED Medications  dexamethasone (DECADRON) injection 10 mg (10 mg Intramuscular Given 07/16/17 1834)     Initial Impression / Assessment and Plan / ED Course  I have  reviewed the triage vital signs and the nursing notes.  Pertinent labs & imaging results that were available during my care of the patient were reviewed by me and considered in my medical decision making (see chart for details).    Cathy [name] is a 56 y.o. female who presents to ED for intermittently productive cough.   On exam, patient is afebrile, non-toxic appearing with a clear lung exam. Mild rhinorrhea and OP with erythema but no exudates or tonsillar hypertrophy.  CXR negative.   Sxs today likely due to viral URI.Symptomatic home care instructions discussed. Steroid shot given in ED. Rx for inhaler and tessalon given. PCP follow up strongly encouraged if symptoms persist. Reasons to return to ER discussed. All questions answered.   Blood pressure (!) 164/90, pulse (!) 107, temperature 99.6 F (37.6 C), temperature source Oral, resp. rate 18, height 5\' 4"  (1.626 m), weight 113.4 kg (250 lb), SpO2 96 %.   Final Clinical Impressions(s) / ED Diagnoses  Final diagnoses:  Acute upper respiratory infection    ED Discharge Orders        Ordered    benzonatate (TESSALON) 100 MG capsule  Every 8 hours     07/16/17 1835    albuterol (PROVENTIL HFA;VENTOLIN HFA) 108 (90 Base) MCG/ACT inhaler  Every 6 hours PRN     07/16/17 [place], Ozella Almond, PA-C 07/16/17 1850    Valarie Merino, MD 07/16/17 2312

## 2017-10-08 ENCOUNTER — Encounter: Payer: Self-pay | Admitting: Family Medicine

## 2017-10-08 ENCOUNTER — Ambulatory Visit: Payer: Medicare Other | Attending: Family Medicine | Admitting: Family Medicine

## 2017-10-08 VITALS — BP 175/84 | HR 63 | Temp 98.0°F | Ht 64.0 in | Wt 256.0 lb

## 2017-10-08 DIAGNOSIS — Z79899 Other long term (current) drug therapy: Secondary | ICD-10-CM | POA: Insufficient documentation

## 2017-10-08 DIAGNOSIS — M25511 Pain in right shoulder: Secondary | ICD-10-CM | POA: Diagnosis not present

## 2017-10-08 DIAGNOSIS — Z7951 Long term (current) use of inhaled steroids: Secondary | ICD-10-CM | POA: Insufficient documentation

## 2017-10-08 DIAGNOSIS — R0609 Other forms of dyspnea: Secondary | ICD-10-CM | POA: Insufficient documentation

## 2017-10-08 DIAGNOSIS — M25512 Pain in left shoulder: Secondary | ICD-10-CM

## 2017-10-08 DIAGNOSIS — E669 Obesity, unspecified: Secondary | ICD-10-CM | POA: Diagnosis not present

## 2017-10-08 DIAGNOSIS — I10 Essential (primary) hypertension: Secondary | ICD-10-CM | POA: Diagnosis not present

## 2017-10-08 DIAGNOSIS — F329 Major depressive disorder, single episode, unspecified: Secondary | ICD-10-CM | POA: Insufficient documentation

## 2017-10-08 DIAGNOSIS — F419 Anxiety disorder, unspecified: Secondary | ICD-10-CM | POA: Insufficient documentation

## 2017-10-08 DIAGNOSIS — G8929 Other chronic pain: Secondary | ICD-10-CM | POA: Insufficient documentation

## 2017-10-08 DIAGNOSIS — G4733 Obstructive sleep apnea (adult) (pediatric): Secondary | ICD-10-CM | POA: Diagnosis not present

## 2017-10-08 DIAGNOSIS — Z7982 Long term (current) use of aspirin: Secondary | ICD-10-CM | POA: Diagnosis not present

## 2017-10-08 DIAGNOSIS — F32A Depression, unspecified: Secondary | ICD-10-CM

## 2017-10-08 MED ORDER — METOPROLOL TARTRATE 25 MG PO TABS: 25.0000 mg | ORAL_TABLET | Freq: Two times a day (BID) | ORAL | 3 refills | Status: DC

## 2017-10-08 MED ORDER — ATORVASTATIN CALCIUM 10 MG PO TABS: 10.0000 mg | ORAL_TABLET | Freq: Every day | ORAL | 3 refills | Status: DC

## 2017-10-08 MED ORDER — DULOXETINE HCL 30 MG PO CPEP: 30.0000 mg | ORAL_CAPSULE | Freq: Every day | ORAL | 3 refills | Status: DC

## 2017-10-08 MED ORDER — CLONIDINE HCL 0.1 MG PO TABS: 0.1000 mg | ORAL_TABLET | Freq: Two times a day (BID) | ORAL | 3 refills | Status: DC

## 2017-10-08 MED ORDER — ALBUTEROL SULFATE HFA 108 (90 BASE) MCG/ACT IN AERS: 1.0000 | INHALATION_SPRAY | Freq: Four times a day (QID) | RESPIRATORY_TRACT | 3 refills | Status: DC | PRN

## 2017-10-08 MED ORDER — MELOXICAM 7.5 MG PO TABS: 7.5000 mg | ORAL_TABLET | Freq: Every day | ORAL | 1 refills | Status: DC

## 2017-10-08 NOTE — Patient Instructions (Signed)
Shoulder Pain Many things can cause shoulder pain, including:  An injury.  Moving the arm in the same way again and again (overuse).  Joint pain (arthritis).  Follow these instructions at home: Take these actions to help with your pain:  Squeeze a soft ball or a foam pad as much as you can. This helps to prevent swelling. It also makes the arm stronger.  Take over-the-counter and prescription medicines only as told by your doctor.  If told, put ice on the area: ? Put ice in a plastic bag. ? Place a towel between your skin and the bag. ? Leave the ice on for 20 minutes, 2-3 times per day. Stop putting on ice if it does not help with the pain.  If you were given a shoulder sling or immobilizer: ? Wear it as told. ? Remove it to shower or bathe. ? Move your arm as little as possible. ? Keep your hand moving. This helps prevent swelling.  Contact a doctor if:  Your pain gets worse.  Medicine does not help your pain.  You have new pain in your arm, hand, or fingers. Get help right away if:  Your arm, hand, or fingers: ? Tingle. ? Are numb. ? Are swollen. ? Are painful. ? Turn white or blue. This information is not intended to replace advice given to you by your health care provider. Make sure you discuss any questions you have with your health care provider. Document Released: 01/09/2008 Document Revised: 03/18/2016 Document Reviewed: 11/15/2014 Elsevier Interactive Patient Education  2018 Elsevier Inc.  

## 2017-10-09 ENCOUNTER — Ambulatory Visit: Payer: Medicare Other | Attending: Family Medicine

## 2017-10-09 DIAGNOSIS — F329 Major depressive disorder, single episode, unspecified: Secondary | ICD-10-CM | POA: Insufficient documentation

## 2017-10-09 DIAGNOSIS — I10 Essential (primary) hypertension: Secondary | ICD-10-CM | POA: Insufficient documentation

## 2017-10-09 DIAGNOSIS — F419 Anxiety disorder, unspecified: Secondary | ICD-10-CM

## 2017-10-09 DIAGNOSIS — R0609 Other forms of dyspnea: Secondary | ICD-10-CM | POA: Diagnosis present

## 2017-10-09 NOTE — Progress Notes (Signed)
Subjective:  Patient ID: Cathy [name], female    DOB: 19-Sep-1960  Age: 57 y.o. MRN: [id number]  CC: Hypertension and Medication Refill   HPI Cathy [name] is a 57 year old female with a history of Hypertension, Obesity evaluated for chest pain last year with a stress test which showed ischemia involving the LAD territory followed by cardiac catheterization which showed normal coronary artery disease.  There was a mild spasm being induced by catheter with reduction to 30% but otherwise normal. She was last seen in the clinic 3 years ago and complains of a 6 month historydyspnea on mild exertion and having to stop often to rest and is requesting a handicap form completed today.Denies wheezing, pedal edema but sleeps on 3 pillows; she does not smoke. Also endorses some weight gain. EF from 05/2017 was 60-65%, grade 1 DD. Endorses sleep apnea symptoms with interrupted sleep,snoring, daytime somnolence and fatigue. She is also complaining of R shoulder pain which radiates anteriorly for 2 weeks. She received Cortisone injections in the recent past which did not provide much relief and pain is rated as 6-7/10.  Past Medical History:  Diagnosis Date  . Back pain   . Hypertension     Past Surgical History:  Procedure Laterality Date  . BACK SURGERY    . KNEE SURGERY    . LEFT HEART CATH AND CORONARY ANGIOGRAPHY N/A 05/20/2017   Procedure: LEFT HEART CATH AND CORONARY ANGIOGRAPHY;  Surgeon: Leonie Man, MD;  Location: [facility];  Service: Cardiovascular;  Laterality: N/A;    Allergies  Allergen Reactions  . Isovue [Iopamidol] Tinitus    Sneezing and congestion post contrast administration, pt will require premeds for future contrasted exams     Outpatient Medications Prior to Visit  Medication Sig Dispense Refill  . aspirin EC 81 MG tablet Take 81 mg by mouth 2 (two) times daily.    . benzonatate (TESSALON) 100 MG capsule Take 1 capsule (100 mg total) by mouth every 8  (eight) hours. 21 capsule 0  . fluticasone (FLOVENT HFA) 110 MCG/ACT inhaler Inhale 2 puffs into the lungs 2 (two) times daily.    [name] albuterol (PROVENTIL HFA;VENTOLIN HFA) 108 (90 Base) MCG/ACT inhaler Inhale 1-2 puffs into the lungs every 6 (six) hours as needed for wheezing or shortness of breath. 1 Inhaler 0  . cloNIDine (CATAPRES) 0.1 MG tablet Take 0.1 mg by mouth 2 (two) times daily.    . DULoxetine (CYMBALTA) 20 MG capsule Take 20 mg by mouth daily.    . nitroGLYCERIN (NITROSTAT) 0.4 MG SL tablet Place 1 tablet (0.4 mg total) under the tongue every 5 (five) minutes as needed for chest pain. 25 tablet 11  . Pumpkin Seed-Soy Germ (AZO BLADDER CONTROL/GO-LESS PO) Take 1 tablet by mouth daily.    [name] atorvastatin (LIPITOR) 10 MG tablet Take 1 tablet (10 mg total) by mouth daily. 90 tablet 3  . metoprolol tartrate (LOPRESSOR) 25 MG tablet Take 1 tablet (25 mg total) by mouth 2 (two) times daily. 180 tablet 3   Facility-Administered Medications Prior to Visit  Medication Dose Route Frequency Provider Last Rate Last Dose  . betamethasone acetate-betamethasone sodium phosphate (CELESTONE) injection 3 mg  3 mg Intramuscular Once Daylene Katayama M, DPM      . betamethasone acetate-betamethasone sodium phosphate (CELESTONE) injection 3 mg  3 mg Intramuscular Once Daylene Katayama M, DPM      . betamethasone acetate-betamethasone sodium phosphate (CELESTONE) injection 3 mg  3 mg Intramuscular  Once Edrick Kins, DPM        ROS Review of Systems  Constitutional: Negative for activity change, appetite change and fatigue.  HENT: Negative for congestion, sinus pressure and sore throat.   Eyes: Negative for visual disturbance.  Respiratory: Positive for shortness of breath. Negative for cough, chest tightness and wheezing.   Cardiovascular: Negative for chest pain and palpitations.  Gastrointestinal: Negative for abdominal distention, abdominal pain and constipation.  Endocrine: Negative for polydipsia.    Genitourinary: Negative for dysuria and frequency.  Musculoskeletal: Negative for arthralgias and back pain.  Skin: Negative for rash.  Neurological: Negative for tremors, light-headedness and numbness.  Hematological: Does not bruise/bleed easily.  Psychiatric/Behavioral: Positive for sleep disturbance. Negative for agitation and behavioral problems.    Objective:  BP (!) 175/84   Pulse 63   Temp 98 F (36.7 C) (Oral)   Ht '5\' 4"'  (1.626 m)   Wt 256 lb (116.1 kg)   SpO2 100%   BMI 43.94 kg/m   BP/Weight 10/08/2017 07/16/2017 16/10/8451  Systolic BP 646 803 212  Diastolic BP 84 90 90  Wt. (Lbs) 256 250 252.12  BMI 43.94 42.91 43.28      Physical Exam  Constitutional: She is oriented to person, place, and time. She appears well-developed and well-nourished.  Cardiovascular: Normal rate, normal heart sounds and intact distal pulses.  No murmur heard. Pulmonary/Chest: Effort normal and breath sounds normal. She has no wheezes. She has no rales. She exhibits no tenderness.  Abdominal: Soft. Bowel sounds are normal. She exhibits no distension and no mass. There is no tenderness.  Musculoskeletal: She exhibits tenderness (TTP of ant. R shoulder; active abduction limited to 90 degrees and beyond that  is associated with pain).  Neurological: She is alert and oriented to person, place, and time.  Hand grip 3+/5 on RUE and 5/5 on LUE  Skin: Skin is warm and dry.  Psychiatric: She has a normal mood and affect.     Assessment & Plan:   1. Dyspnea on exertion Cardiac etiology unlikely given normal EF, normal cardiac cath Will add flovent in the meantime and consider PFTs if symptoms persist Recent weight gain could also explain this Completed handicap form. - albuterol (PROVENTIL HFA;VENTOLIN HFA) 108 (90 Base) MCG/ACT inhaler; Inhale 1-2 puffs into the lungs every 6 (six) hours as needed for wheezing or shortness of breath.  Dispense: 1 Inhaler; Refill: 3 - cloNIDine (CATAPRES)  0.1 MG tablet; Take 1 tablet (0.1 mg total) by mouth 2 (two) times daily.  Dispense: 60 tablet; Refill: 3 - Brain natriuretic peptide - fluticasone (FLOVENT HFA) 110 MCG/ACT inhaler; Inhale 2 puffs into the lungs 2 (two) times daily.  2. Chronic left shoulder pain S/p cortisone injections with no much relief Consider xray at next visit and possible PT referral - meloxicam (MOBIC) 7.5 MG tablet; Take 1 tablet (7.5 mg total) by mouth daily.  Dispense: 30 tablet; Refill: 1  3. Obstructive sleep apnea - Split night study; Future  4. Essential hypertension Uncontrolled Attributes this to being stressed from carpool and having to pick up her grandkids Counseled on blood pressure goal of less than 130/80, low-sodium, DASH diet, medication compliance, 150 minutes of moderate intensity exercise per week. Discussed medication compliance, adverse effects. - metoprolol tartrate (LOPRESSOR) 25 MG tablet; Take 1 tablet (25 mg total) by mouth 2 (two) times daily.  Dispense: 60 tablet; Refill: 3 - CMP14+EGFR  5. Anxiety and depression Doing well on Cymbalata   Meds ordered  this encounter  Medications  . albuterol (PROVENTIL HFA;VENTOLIN HFA) 108 (90 Base) MCG/ACT inhaler    Sig: Inhale 1-2 puffs into the lungs every 6 (six) hours as needed for wheezing or shortness of breath.    Dispense:  1 Inhaler    Refill:  3  . metoprolol tartrate (LOPRESSOR) 25 MG tablet    Sig: Take 1 tablet (25 mg total) by mouth 2 (two) times daily.    Dispense:  60 tablet    Refill:  3  . DULoxetine (CYMBALTA) 30 MG capsule    Sig: Take 1 capsule (30 mg total) by mouth daily.    Dispense:  30 capsule    Refill:  3  . atorvastatin (LIPITOR) 10 MG tablet    Sig: Take 1 tablet (10 mg total) by mouth daily.    Dispense:  90 tablet    Refill:  3  . cloNIDine (CATAPRES) 0.1 MG tablet    Sig: Take 1 tablet (0.1 mg total) by mouth 2 (two) times daily.    Dispense:  60 tablet    Refill:  3  . meloxicam (MOBIC) 7.5 MG  tablet    Sig: Take 1 tablet (7.5 mg total) by mouth daily.    Dispense:  30 tablet    Refill:  1    Follow-up: Return in about 1 month (around 11/08/2017) for follow up of left shoulder pain.   Charlott Rakes MD

## 2017-10-09 NOTE — Progress Notes (Signed)
Patient here for lab visit only 

## 2017-10-10 LAB — CMP14+EGFR
ALT: 18 IU/L (ref 0–32)
AST: 21 IU/L (ref 0–40)
Albumin/Globulin Ratio: 1.5 (ref 1.2–2.2)
Albumin: 4.4 g/dL (ref 3.5–5.5)
Alkaline Phosphatase: 83 IU/L (ref 39–117)
BUN/Creatinine Ratio: 12 (ref 9–23)
BUN: 10 mg/dL (ref 6–24)
Bilirubin Total: 0.5 mg/dL (ref 0.0–1.2)
CO2: 23 mmol/L (ref 20–29)
Calcium: 9.7 mg/dL (ref 8.7–10.2)
Chloride: 104 mmol/L (ref 96–106)
Creatinine, Ser: 0.86 mg/dL (ref 0.57–1.00)
GFR calc Af Amer: 89 mL/min/1.73
GFR calc non Af Amer: 77 mL/min/1.73
Globulin, Total: 3 g/dL (ref 1.5–4.5)
Glucose: 85 mg/dL (ref 65–99)
Potassium: 4.6 mmol/L (ref 3.5–5.2)
Sodium: 139 mmol/L (ref 134–144)
Total Protein: 7.4 g/dL (ref 6.0–8.5)

## 2017-10-10 LAB — BRAIN NATRIURETIC PEPTIDE: BNP: 69.3 pg/mL (ref 0.0–100.0)

## 2017-10-30 ENCOUNTER — Ambulatory Visit (HOSPITAL_BASED_OUTPATIENT_CLINIC_OR_DEPARTMENT_OTHER): Payer: Medicare Other | Attending: Family Medicine | Admitting: Internal Medicine

## 2017-10-30 VITALS — Ht 64.0 in | Wt 250.0 lb

## 2017-10-30 DIAGNOSIS — Z79899 Other long term (current) drug therapy: Secondary | ICD-10-CM | POA: Insufficient documentation

## 2017-10-30 DIAGNOSIS — G4733 Obstructive sleep apnea (adult) (pediatric): Secondary | ICD-10-CM

## 2017-11-02 DIAGNOSIS — G4733 Obstructive sleep apnea (adult) (pediatric): Secondary | ICD-10-CM | POA: Diagnosis not present

## 2017-11-02 NOTE — Procedures (Signed)
   Patient Name: Cathy [name], Cathy [name] Date: 10/30/2017 Gender: Female D.O.B: 1960-10-10 Age (years): 37 Referring Provider: Arnoldo Morale Height (inches): 64 Interpreting Physician: Baird Lyons MD, ABSM Weight (lbs): 250 RPSGT: Zadie Rhine BMI: 43 MRN: [id number] Neck Size: 14.50  CLINICAL INFORMATION Sleep Study Type: NPSG  Indication for sleep study: OSA  Epworth Sleepiness Score: 11  SLEEP STUDY TECHNIQUE As per the AASM Manual for the Scoring of Sleep and Associated Events v2.3 (April 2016) with a hypopnea requiring 4% desaturations.  The channels recorded and monitored were frontal, central and occipital EEG, electrooculogram (EOG), submentalis EMG (chin), nasal and oral airflow, thoracic and abdominal wall motion, anterior tibialis EMG, snore microphone, electrocardiogram, and pulse oximetry.  MEDICATIONS Medications self-administered by patient taken the night of the study : CATAPRES, CYMBALTA, MOBIC, [place] The study was initiated at 10:04:53 PM and ended at 4:29:44 AM.  Sleep onset time was 20.3 minutes and the sleep efficiency was 88.5%%. The total sleep time was 340.5 minutes.  Stage REM latency was 194.5 minutes.  The patient spent 4.4%% of the night in stage N1 sleep, 68.0%% in stage N2 sleep, 18.2%% in stage N3 and 9.40% in REM.  Alpha intrusion was absent.  Supine sleep was 64.61%.  RESPIRATORY PARAMETERS The overall apnea/hypopnea index (AHI) was 8.1 per hour. There were 0 total apneas, including 0 obstructive, 0 central and 0 mixed apneas. There were 46 hypopneas and 16 RERAs.  The AHI during Stage REM sleep was 26.3 per hour.  AHI while supine was 9.5 per hour.  The mean oxygen saturation was 94.0%. The minimum SpO2 during sleep was 85.0%.  moderate snoring was noted during this study.  CARDIAC DATA The 2 lead EKG demonstrated sinus rhythm. The mean heart rate was 56.4 beats per minute. Other EKG findings include:  None.  LEG MOVEMENT DATA The total PLMS were 0 with a resulting PLMS index of 0.0. Associated arousal with leg movement index was 0.0 .  IMPRESSIONS - Mild obstructive sleep apnea occurred during this study (AHI = 8.1/h). - No significant central sleep apnea occurred during this study (CAI = 0.0/h). - Mild oxygen desaturation was noted during this study (Min O2 = 85.0%). Mean 94% - The patient snored with moderate snoring volume. - No cardiac abnormalities were noted during this study. - Clinically significant periodic limb movements did not occur during sleep. No significant associated arousals.  DIAGNOSIS - Obstructive Sleep Apnea (327.23 [G47.33 ICD-10])  RECOMMENDATIONS - Treatment for mild OSA is directed at symptoms. If conservative measures are insufficient, consider CPAP or a fitted oral appliance, based on clinical judgment. - Be careful with alcohol, sedatives and other CNS depressants that may worsen sleep apnea and disrupt normal sleep architecture. - Sleep hygiene should be reviewed to assess factors that may improve sleep quality. - Weight management and regular exercise should be initiated or continued if appropriate.  [Electronically signed] 11/02/2017 04:20 PM  Baird Lyons MD, [place], American Board of Sleep Medicine   NPI: [id number]                          [place], [place] [facility]  ELECTRONICALLY SIGNED ON:  11/02/2017, 4:17 PM [place] PH: [phone]   FX: [phone] [place]

## 2017-11-12 ENCOUNTER — Ambulatory Visit: Payer: Medicare Other | Attending: Family Medicine | Admitting: Family Medicine

## 2017-11-12 ENCOUNTER — Telehealth: Payer: Self-pay | Admitting: Family Medicine

## 2017-11-12 ENCOUNTER — Encounter: Payer: Self-pay | Admitting: Family Medicine

## 2017-11-12 VITALS — BP 134/85 | HR 56 | Temp 97.8°F | Ht 64.0 in | Wt 258.4 lb

## 2017-11-12 DIAGNOSIS — M25511 Pain in right shoulder: Secondary | ICD-10-CM | POA: Diagnosis not present

## 2017-11-12 DIAGNOSIS — Z79899 Other long term (current) drug therapy: Secondary | ICD-10-CM | POA: Insufficient documentation

## 2017-11-12 DIAGNOSIS — R05 Cough: Secondary | ICD-10-CM | POA: Diagnosis not present

## 2017-11-12 DIAGNOSIS — Z791 Long term (current) use of non-steroidal anti-inflammatories (NSAID): Secondary | ICD-10-CM | POA: Diagnosis not present

## 2017-11-12 DIAGNOSIS — Z6841 Body Mass Index (BMI) 40.0 and over, adult: Secondary | ICD-10-CM | POA: Insufficient documentation

## 2017-11-12 DIAGNOSIS — G4733 Obstructive sleep apnea (adult) (pediatric): Secondary | ICD-10-CM

## 2017-11-12 DIAGNOSIS — I10 Essential (primary) hypertension: Secondary | ICD-10-CM | POA: Diagnosis present

## 2017-11-12 DIAGNOSIS — R059 Cough, unspecified: Secondary | ICD-10-CM

## 2017-11-12 DIAGNOSIS — Z23 Encounter for immunization: Secondary | ICD-10-CM

## 2017-11-12 DIAGNOSIS — I2510 Atherosclerotic heart disease of native coronary artery without angina pectoris: Secondary | ICD-10-CM | POA: Insufficient documentation

## 2017-11-12 DIAGNOSIS — Z888 Allergy status to other drugs, medicaments and biological substances status: Secondary | ICD-10-CM | POA: Insufficient documentation

## 2017-11-12 DIAGNOSIS — Z7982 Long term (current) use of aspirin: Secondary | ICD-10-CM | POA: Diagnosis not present

## 2017-11-12 DIAGNOSIS — E669 Obesity, unspecified: Secondary | ICD-10-CM | POA: Diagnosis not present

## 2017-11-12 DIAGNOSIS — G8929 Other chronic pain: Secondary | ICD-10-CM | POA: Diagnosis not present

## 2017-11-12 MED ORDER — BENZONATATE 100 MG PO CAPS: 100.0000 mg | ORAL_CAPSULE | Freq: Three times a day (TID) | ORAL | 0 refills | Status: DC

## 2017-11-12 MED ORDER — TETANUS-DIPHTH-ACELL PERTUSSIS 5-2.5-18.5 LF-MCG/0.5 IM SUSP: 0.5000 mL | Freq: Once | INTRAMUSCULAR | 0 refills | Status: AC

## 2017-11-12 MED FILL — BOOSTRIX VACCINE SYRINGE: 5-2.5-18.5 | 1 days supply | Qty: 1 | Fill #0

## 2017-11-12 NOTE — Progress Notes (Signed)
Subjective:  Patient ID: Janylah Belgrave, female    DOB: Oct 03, 1960  Age: 57 y.o. MRN: [id number]  CC: Hypertension   HPI Taydem Cavagnaro  is a 57 year old female with a history of Hypertension, Obesity evaluated for chest pain last year with a stress test which showed ischemia involving the LAD territory followed by cardiac catheterization which showed normal coronary artery disease.  There was a mild spasm being induced by catheter with reduction to 30% but otherwise normal. EF from 05/2017 was 60-65%, grade 1 DD.  Here for follow up of sleep apnea symptoms with interrupted sleep,snoring, daytime somnolence and fatigue and sleep studies indicated mild OSA with findings below :  IMPRESSIONS - Mild obstructive sleep apnea occurred during this study (AHI = 8.1/h). - No significant central sleep apnea occurred during this study (CAI = 0.0/h). - Mild oxygen desaturation was noted during this study (Min O2 = 85.0%). Mean 94% - The patient snored with moderate snoring volume. - No cardiac abnormalities were noted during this study. - Clinically significant periodic limb movements did not occur during sleep. No significant associated arousals.  DIAGNOSIS - Obstructive Sleep Apnea (327.23 [G47.33 ICD-10])  RECOMMENDATIONS - Treatment for mild OSA is directed at symptoms. If conservative measures are insufficient, consider CPAP or a fitted oral appliance, based on clinical judgment. - Be careful with alcohol, sedatives and other CNS depressants that may worsen sleep apnea and disrupt normal sleep architecture. - Sleep hygiene should be reviewed to assess factors that may improve sleep quality. - Weight management and regular exercise should be initiated or continued if appropriate.  Commenced on Mobic at her last visit for R shoulder pain which radiates anteriorly  And reports improvement in ROM. She received Cortisone injections in the recent past which did not provide much relief.  Past  Medical History:  Diagnosis Date  . Back pain   . Hypertension     Past Surgical History:  Procedure Laterality Date  . BACK SURGERY    . KNEE SURGERY    . LEFT HEART CATH AND CORONARY ANGIOGRAPHY N/A 05/20/2017   Procedure: LEFT HEART CATH AND CORONARY ANGIOGRAPHY;  Surgeon: Leonie Man, MD;  Location: [facility];  Service: Cardiovascular;  Laterality: N/A;    Allergies  Allergen Reactions  . Isovue [Iopamidol] Tinitus    Sneezing and congestion post contrast administration, pt will require premeds for future contrasted exams     Outpatient Medications Prior to Visit  Medication Sig Dispense Refill  . albuterol (PROVENTIL HFA;VENTOLIN HFA) 108 (90 Base) MCG/ACT inhaler Inhale 1-2 puffs into the lungs every 6 (six) hours as needed for wheezing or shortness of breath. 1 Inhaler 3  . aspirin EC 81 MG tablet Take 81 mg by mouth 2 (two) times daily.    [name] atorvastatin (LIPITOR) 10 MG tablet Take 1 tablet (10 mg total) by mouth daily. 90 tablet 3  . cloNIDine (CATAPRES) 0.1 MG tablet Take 1 tablet (0.1 mg total) by mouth 2 (two) times daily. 60 tablet 3  . DULoxetine (CYMBALTA) 30 MG capsule Take 1 capsule (30 mg total) by mouth daily. 30 capsule 3  . fluticasone (FLOVENT HFA) 110 MCG/ACT inhaler Inhale 2 puffs into the lungs 2 (two) times daily.    . meloxicam (MOBIC) 7.5 MG tablet Take 1 tablet (7.5 mg total) by mouth daily. 30 tablet 1  . metoprolol tartrate (LOPRESSOR) 25 MG tablet Take 1 tablet (25 mg total) by mouth 2 (two) times daily. 60 tablet  3  . Pumpkin Seed-Soy Germ (AZO BLADDER CONTROL/GO-LESS PO) Take 1 tablet by mouth daily.    . benzonatate (TESSALON) 100 MG capsule Take 1 capsule (100 mg total) by mouth every 8 (eight) hours. 21 capsule 0  . nitroGLYCERIN (NITROSTAT) 0.4 MG SL tablet Place 1 tablet (0.4 mg total) under the tongue every 5 (five) minutes as needed for chest pain. 25 tablet 11   Facility-Administered Medications Prior to Visit  Medication  Dose Route Frequency Provider Last Rate Last Dose  . betamethasone acetate-betamethasone sodium phosphate (CELESTONE) injection 3 mg  3 mg Intramuscular Once Daylene Katayama M, DPM      . betamethasone acetate-betamethasone sodium phosphate (CELESTONE) injection 3 mg  3 mg Intramuscular Once Daylene Katayama M, DPM      . betamethasone acetate-betamethasone sodium phosphate (CELESTONE) injection 3 mg  3 mg Intramuscular Once Daylene Katayama M, DPM        ROS Review of Systems  Constitutional: Negative for activity change, appetite change and fatigue.  HENT: Negative for congestion, sinus pressure and sore throat.   Eyes: Negative for visual disturbance.  Respiratory: Negative for cough, chest tightness, shortness of breath and wheezing.   Cardiovascular: Negative for chest pain and palpitations.  Gastrointestinal: Negative for abdominal distention, abdominal pain and constipation.  Endocrine: Negative for polydipsia.  Genitourinary: Negative for dysuria and frequency.  Musculoskeletal:       See hpi  Skin: Negative for rash.  Neurological: Negative for tremors, light-headedness and numbness.  Hematological: Does not bruise/bleed easily.  Psychiatric/Behavioral: Negative for agitation and behavioral problems.    Objective:  BP 134/85   Pulse (!) 56   Temp 97.8 F (36.6 C) (Oral)   Ht 5\' 4"  (1.626 m)   Wt 258 lb 6.4 oz (117.2 kg)   SpO2 97%   BMI 44.35 kg/m   BP/Weight 11/12/2017 0/05/9322 12/08/7320  Systolic BP 025 - 427  Diastolic BP 85 - 84  Wt. (Lbs) 258.4 250 256  BMI 44.35 42.91 43.94      Physical Exam  Constitutional: She is oriented to person, place, and time. She appears well-developed and well-nourished.  Cardiovascular: Normal rate, normal heart sounds and intact distal pulses.  No murmur heard. Pulmonary/Chest: Effort normal and breath sounds normal. She has no wheezes. She has no rales. She exhibits no tenderness.  Abdominal: Soft. Bowel sounds are normal. She exhibits  no distension and no mass. There is no tenderness.  Musculoskeletal:  R shoulder abduction limited to 90 degrees  Neurological: She is alert and oriented to person, place, and time.     Assessment & Plan:   1. Chronic right shoulder pain Improved but still with some restricted ROM Doing well on Meloxicam  2. Essential hypertension Controlled Low sodium, DASH diet  3. Obstructive sleep apnea Rx for CPAP sent to DME company  4. Cough - benzonatate (TESSALON) 100 MG capsule; Take 1 capsule (100 mg total) by mouth every 8 (eight) hours.  Dispense: 21 capsule; Refill: 0  5. Need for Tdap vaccination - Tdap ([place]) 5-2.5-18.5 LF-MCG/0.5 injection; Inject 0.5 mLs into the muscle once for 1 dose.  Dispense: 0.5 mL; Refill: 0   Meds ordered this encounter  Medications  . Tdap (BOOSTRIX) 5-2.5-18.5 LF-MCG/0.5 injection    Sig: Inject 0.5 mLs into the muscle once for 1 dose.    Dispense:  0.5 mL    Refill:  0  . DISCONTD: benzonatate (TESSALON) 100 MG capsule    Sig: Take 1 capsule (100  mg total) by mouth every 8 (eight) hours.    Dispense:  21 capsule    Refill:  0  . benzonatate (TESSALON) 100 MG capsule    Sig: Take 1 capsule (100 mg total) by mouth every 8 (eight) hours.    Dispense:  21 capsule    Refill:  0    Follow-up: Return in about 3 months (around 02/11/2018) for Follow-up of chronic medical conditions.   Charlott Rakes MD

## 2017-11-12 NOTE — Telephone Encounter (Signed)
Faxed over patient's Cpap rx and information to [facility] [phone].

## 2017-11-12 NOTE — Patient Instructions (Signed)

## 2017-11-13 ENCOUNTER — Telehealth: Payer: Self-pay

## 2017-11-13 NOTE — Telephone Encounter (Signed)
Call placed to [facility] to Angie who confirmed receipt of the CPAP referral.  She stated that the order has been processed through the patient's insurance and the next step is for the respiratory therapist to call the patient to schedule set up.

## 2017-12-21 DIAGNOSIS — G4733 Obstructive sleep apnea (adult) (pediatric): Secondary | ICD-10-CM | POA: Diagnosis not present

## 2017-12-22 ENCOUNTER — Encounter: Payer: Self-pay | Admitting: Family Medicine

## 2018-01-10 ENCOUNTER — Encounter: Payer: Self-pay | Admitting: Internal Medicine

## 2018-01-21 DIAGNOSIS — G4733 Obstructive sleep apnea (adult) (pediatric): Secondary | ICD-10-CM | POA: Diagnosis not present

## 2018-02-14 ENCOUNTER — Ambulatory Visit: Payer: Medicare Other | Admitting: Family Medicine

## 2018-02-17 ENCOUNTER — Ambulatory Visit: Payer: Medicare Other | Attending: Family Medicine | Admitting: Family Medicine

## 2018-02-17 ENCOUNTER — Encounter: Payer: Self-pay | Admitting: Family Medicine

## 2018-02-17 VITALS — BP 144/89 | HR 56 | Temp 98.2°F | Resp 18 | Ht 64.0 in | Wt 263.0 lb

## 2018-02-17 DIAGNOSIS — G8929 Other chronic pain: Secondary | ICD-10-CM | POA: Diagnosis not present

## 2018-02-17 DIAGNOSIS — R0609 Other forms of dyspnea: Secondary | ICD-10-CM | POA: Insufficient documentation

## 2018-02-17 DIAGNOSIS — Z79899 Other long term (current) drug therapy: Secondary | ICD-10-CM | POA: Insufficient documentation

## 2018-02-17 DIAGNOSIS — F329 Major depressive disorder, single episode, unspecified: Secondary | ICD-10-CM | POA: Diagnosis not present

## 2018-02-17 DIAGNOSIS — M25511 Pain in right shoulder: Secondary | ICD-10-CM | POA: Diagnosis not present

## 2018-02-17 DIAGNOSIS — M25512 Pain in left shoulder: Secondary | ICD-10-CM | POA: Diagnosis not present

## 2018-02-17 DIAGNOSIS — I10 Essential (primary) hypertension: Secondary | ICD-10-CM | POA: Insufficient documentation

## 2018-02-17 DIAGNOSIS — F419 Anxiety disorder, unspecified: Secondary | ICD-10-CM | POA: Insufficient documentation

## 2018-02-17 DIAGNOSIS — Z1159 Encounter for screening for other viral diseases: Secondary | ICD-10-CM | POA: Diagnosis not present

## 2018-02-17 DIAGNOSIS — Z7982 Long term (current) use of aspirin: Secondary | ICD-10-CM | POA: Diagnosis not present

## 2018-02-17 DIAGNOSIS — G4733 Obstructive sleep apnea (adult) (pediatric): Secondary | ICD-10-CM

## 2018-02-17 MED ORDER — METHOCARBAMOL 500 MG PO TABS: 500.0000 mg | ORAL_TABLET | Freq: Four times a day (QID) | ORAL | 3 refills | Status: DC

## 2018-02-17 MED ORDER — ALBUTEROL SULFATE HFA 108 (90 BASE) MCG/ACT IN AERS: 1.0000 | INHALATION_SPRAY | Freq: Four times a day (QID) | RESPIRATORY_TRACT | 3 refills | Status: DC | PRN

## 2018-02-17 MED ORDER — ATORVASTATIN CALCIUM 10 MG PO TABS: 10.0000 mg | ORAL_TABLET | Freq: Every day | ORAL | 3 refills | Status: DC

## 2018-02-17 MED ORDER — DULOXETINE HCL 30 MG PO CPEP: 30.0000 mg | ORAL_CAPSULE | Freq: Every day | ORAL | 3 refills | Status: DC

## 2018-02-17 MED ORDER — MELOXICAM 7.5 MG PO TABS: 7.5000 mg | ORAL_TABLET | Freq: Every day | ORAL | 3 refills | Status: DC

## 2018-02-17 MED ORDER — METOPROLOL TARTRATE 25 MG PO TABS: 25.0000 mg | ORAL_TABLET | Freq: Two times a day (BID) | ORAL | 3 refills | Status: DC

## 2018-02-17 NOTE — Progress Notes (Signed)
Subjective:  Patient ID: Cathy [name], female    DOB: [birth year]  Age: 57 y.o. MRN: [id number]  CC: Follow-up (bilateral shoulder pain)   HPI Cathy [name] is a 57 year old female with a history of hypertension, obstructive sleep apnea, obesity who presents today for a follow-up visit.  She is currently on CPAP for treatment of her sleep apnea and reports improvement in her energy, reduced fatigue and absent of headaches.  She uses a CPAP machine about 29 days a month. She had noticed that on nights when she keeps her 71 month old grandson with her she does not have apneic episodes with her CPAP but on nights when he is not sleeping in bed with her she has had some apneic episodes which she rates at about 4. She complains of bilateral shoulder pain for which I placed on meloxicam at her last visit and she and her upper extremities.  Which she has run out of.  She points to both shoulders and posterior to her shoulders bilaterally with reduced range of motion pain is rated as moderate to severe.  Past Medical History:  Diagnosis Date  . Back pain   . Hypertension     Past Surgical History:  Procedure Laterality Date  . BACK SURGERY    . KNEE SURGERY    . LEFT HEART CATH AND CORONARY ANGIOGRAPHY N/A 05/20/2017   Procedure: LEFT HEART CATH AND CORONARY ANGIOGRAPHY;  Surgeon: Leonie Man, MD;  Location: [facility];  Service: Cardiovascular;  Laterality: N/A;    Allergies  Allergen Reactions  . Isovue [Iopamidol] Tinitus    Sneezing and congestion post contrast administration, pt will require premeds for future contrasted exams     Outpatient Medications Prior to Visit  Medication Sig Dispense Refill  . aspirin EC 81 MG tablet Take 81 mg by mouth 2 (two) times daily.    . benzonatate (TESSALON) 100 MG capsule Take 1 capsule (100 mg total) by mouth every 8 (eight) hours. 21 capsule 0  . cloNIDine (CATAPRES) 0.1 MG tablet Take 1 tablet (0.1 mg total) by mouth 2 (two)  times daily. 60 tablet 3  . fluticasone (FLOVENT HFA) 110 MCG/ACT inhaler Inhale 2 puffs into the lungs 2 (two) times daily.    . nitroGLYCERIN (NITROSTAT) 0.4 MG SL tablet Place 1 tablet (0.4 mg total) under the tongue every 5 (five) minutes as needed for chest pain. 25 tablet 11  . Pumpkin Seed-Soy Germ (AZO BLADDER CONTROL/GO-LESS PO) Take 1 tablet by mouth daily.    [name] albuterol (PROVENTIL HFA;VENTOLIN HFA) 108 (90 Base) MCG/ACT inhaler Inhale 1-2 puffs into the lungs every 6 (six) hours as needed for wheezing or shortness of breath. 1 Inhaler 3  . atorvastatin (LIPITOR) 10 MG tablet Take 1 tablet (10 mg total) by mouth daily. 90 tablet 3  . DULoxetine (CYMBALTA) 30 MG capsule Take 1 capsule (30 mg total) by mouth daily. 30 capsule 3  . meloxicam (MOBIC) 7.5 MG tablet Take 1 tablet (7.5 mg total) by mouth daily. 30 tablet 1  . metoprolol tartrate (LOPRESSOR) 25 MG tablet Take 1 tablet (25 mg total) by mouth 2 (two) times daily. 60 tablet 3   Facility-Administered Medications Prior to Visit  Medication Dose Route Frequency Provider Last Rate Last Dose  . betamethasone acetate-betamethasone sodium phosphate (CELESTONE) injection 3 mg  3 mg Intramuscular Once Daylene Katayama M, DPM      . betamethasone acetate-betamethasone sodium phosphate (CELESTONE) injection 3 mg  3 mg Intramuscular  Once Daylene Katayama M, DPM      . betamethasone acetate-betamethasone sodium phosphate (CELESTONE) injection 3 mg  3 mg Intramuscular Once Edrick Kins, DPM        ROS Review of Systems  Constitutional: Negative for activity change, appetite change and fatigue.  HENT: Negative for congestion, sinus pressure and sore throat.   Eyes: Negative for visual disturbance.  Respiratory: Negative for cough, chest tightness, shortness of breath and wheezing.   Cardiovascular: Negative for chest pain and palpitations.  Gastrointestinal: Negative for abdominal distention, abdominal pain and constipation.  Endocrine: Negative  for polydipsia.  Genitourinary: Negative for dysuria and frequency.  Musculoskeletal:       See hpi  Skin: Negative for rash.  Neurological: Negative for tremors, light-headedness and numbness.  Hematological: Does not bruise/bleed easily.  Psychiatric/Behavioral: Negative for agitation and behavioral problems.    Objective:  BP (!) 144/89 (BP Location: Left Arm, Patient Position: Sitting, Cuff Size: Large)   Pulse (!) 56   Temp 98.2 F (36.8 C) (Oral)   Resp 18   Ht '5\' 4"'  (1.626 m)   Wt 263 lb (119.3 kg)   SpO2 99%   BMI 45.14 kg/m   BP/Weight 02/17/2018 11/12/2017 0/17/7939  Systolic BP 030 092 -  Diastolic BP 89 85 -  Wt. (Lbs) 263 258.4 250  BMI 45.14 44.35 42.91      Physical Exam  Constitutional: She is oriented to person, place, and time. She appears well-developed and well-nourished.  Cardiovascular: Normal rate, normal heart sounds and intact distal pulses.  No murmur heard. Pulmonary/Chest: Effort normal and breath sounds normal. She has no wheezes. She has no rales. She exhibits no tenderness.  Abdominal: Soft. Bowel sounds are normal. She exhibits no distension and no mass. There is no tenderness.  Musculoskeletal:  Active abduction of right upper extremity limited to 100 degrees and left extremity limited to 90 degrees.  Tenderness to palpation of posterior shoulder joints bilaterally  Neurological: She is alert and oriented to person, place, and time.  Skin: Skin is warm and dry.  Psychiatric: She has a normal mood and affect.     Assessment & Plan:   1. Essential hypertension Slightly elevated No regimen change today Counseled on blood pressure goal of less than 130/80, low-sodium, DASH diet, medication compliance, 150 minutes of moderate intensity exercise per week. Discussed medication compliance, adverse effects. - Lipid panel - CMP14+EGFR - metoprolol tartrate (LOPRESSOR) 25 MG tablet; Take 1 tablet (25 mg total) by mouth 2 (two) times daily.   Dispense: 60 tablet; Refill: 3  2. Chronic pain of both shoulders Uncontrolled; likely underlying osteoarthritis We will add Robaxin to regimen If symptoms persist she might benefit from cortisone injections - methocarbamol (ROBAXIN) 500 MG tablet; Take 1 tablet (500 mg total) by mouth 4 (four) times daily.  Dispense: 60 tablet; Refill: 3 - Ambulatory referral to Physical Therapy - meloxicam (MOBIC) 7.5 MG tablet; Take 1 tablet (7.5 mg total) by mouth daily.  Dispense: 30 tablet; Refill: 3  3. Dyspnea on exertion - albuterol (PROVENTIL HFA;VENTOLIN HFA) 108 (90 Base) MCG/ACT inhaler; Inhale 1-2 puffs into the lungs every 6 (six) hours as needed for wheezing or shortness of breath.  Dispense: 1 Inhaler; Refill: 3  4. Obstructive sleep apnea Currently using CPAP machine with improvement in symptoms  5. Anxiety and depression Stable - DULoxetine (CYMBALTA) 30 MG capsule; Take 1 capsule (30 mg total) by mouth daily.  Dispense: 30 capsule; Refill: 3  6. Screening  for viral disease - Hepatitis c antibody (reflex) - HIV antibody (with reflex)   Meds ordered this encounter  Medications  . methocarbamol (ROBAXIN) 500 MG tablet    Sig: Take 1 tablet (500 mg total) by mouth 4 (four) times daily.    Dispense:  60 tablet    Refill:  3  . metoprolol tartrate (LOPRESSOR) 25 MG tablet    Sig: Take 1 tablet (25 mg total) by mouth 2 (two) times daily.    Dispense:  60 tablet    Refill:  3  . meloxicam (MOBIC) 7.5 MG tablet    Sig: Take 1 tablet (7.5 mg total) by mouth daily.    Dispense:  30 tablet    Refill:  3  . DULoxetine (CYMBALTA) 30 MG capsule    Sig: Take 1 capsule (30 mg total) by mouth daily.    Dispense:  30 capsule    Refill:  3  . atorvastatin (LIPITOR) 10 MG tablet    Sig: Take 1 tablet (10 mg total) by mouth daily.    Dispense:  90 tablet    Refill:  3  . albuterol (PROVENTIL HFA;VENTOLIN HFA) 108 (90 Base) MCG/ACT inhaler    Sig: Inhale 1-2 puffs into the lungs every 6  (six) hours as needed for wheezing or shortness of breath.    Dispense:  1 Inhaler    Refill:  3    Follow-up: Return in about 1 month (around 03/20/2018) for complete physical exam.   Charlott Rakes MD

## 2018-02-18 LAB — CMP14+EGFR
ALBUMIN: 4.4 g/dL (ref 3.5–5.5)
ALT: 17 IU/L (ref 0–32)
AST: 16 IU/L (ref 0–40)
Albumin/Globulin Ratio: 1.7 (ref 1.2–2.2)
Alkaline Phosphatase: 90 IU/L (ref 39–117)
BUN/Creatinine Ratio: 11 (ref 9–23)
BUN: 9 mg/dL (ref 6–24)
Bilirubin Total: 0.2 mg/dL (ref 0.0–1.2)
CALCIUM: 9.5 mg/dL (ref 8.7–10.2)
CO2: 20 mmol/L (ref 20–29)
Chloride: 105 mmol/L (ref 96–106)
Creatinine, Ser: 0.81 mg/dL (ref 0.57–1.00)
GFR calc Af Amer: 95 mL/min/{1.73_m2} (ref >59)
GFR, EST NON AFRICAN AMERICAN: 83 mL/min/{1.73_m2} (ref >59)
GLUCOSE: 81 mg/dL (ref 65–99)
Globulin, Total: 2.6 g/dL (ref 1.5–4.5)
Potassium: 4.6 mmol/L (ref 3.5–5.2)
Sodium: 141 mmol/L (ref 134–144)
Total Protein: 7 g/dL (ref 6.0–8.5)

## 2018-02-18 LAB — LIPID PANEL
CHOL/HDL RATIO: 2.2 ratio (ref 0.0–4.4)
Cholesterol, Total: 173 mg/dL (ref 100–199)
HDL: 78 mg/dL (ref >39)
LDL Calculated: 79 mg/dL (ref 0–99)
TRIGLYCERIDES: 80 mg/dL (ref 0–149)
VLDL Cholesterol Cal: 16 mg/dL (ref 5–40)

## 2018-02-18 LAB — HCV COMMENT:

## 2018-02-18 LAB — HEPATITIS C ANTIBODY (REFLEX): HCV Ab: <0.1 s/co ratio (ref 0.0–0.9)

## 2018-02-18 LAB — HIV ANTIBODY (ROUTINE TESTING W REFLEX): HIV Screen 4th Generation wRfx: NONREACTIVE

## 2018-02-20 DIAGNOSIS — G4733 Obstructive sleep apnea (adult) (pediatric): Secondary | ICD-10-CM | POA: Diagnosis not present

## 2018-02-25 DIAGNOSIS — G4733 Obstructive sleep apnea (adult) (pediatric): Secondary | ICD-10-CM | POA: Diagnosis not present

## 2018-03-04 ENCOUNTER — Encounter: Payer: Self-pay | Admitting: Physical Therapy

## 2018-03-04 ENCOUNTER — Ambulatory Visit: Payer: Medicare Other | Attending: Family Medicine | Admitting: Physical Therapy

## 2018-03-04 ENCOUNTER — Other Ambulatory Visit: Payer: Self-pay

## 2018-03-04 DIAGNOSIS — M25512 Pain in left shoulder: Secondary | ICD-10-CM | POA: Diagnosis not present

## 2018-03-04 DIAGNOSIS — M25612 Stiffness of left shoulder, not elsewhere classified: Secondary | ICD-10-CM

## 2018-03-04 DIAGNOSIS — M25511 Pain in right shoulder: Secondary | ICD-10-CM | POA: Diagnosis not present

## 2018-03-04 DIAGNOSIS — M25611 Stiffness of right shoulder, not elsewhere classified: Secondary | ICD-10-CM | POA: Insufficient documentation

## 2018-03-04 NOTE — Therapy (Signed)
[place] [address] [place], [place], [address] Phone: [phone]   Fax:  [phone]  Physical Therapy Evaluation  Patient Details  Name: Cathy [name] MRN: [id number] Date of Birth: [birth year] Referring Provider: Margarita Rana   Encounter Date: 03/04/2018  PT End of Session - 03/04/18 0944    Visit Number  1    Date for PT Re-Evaluation  05/05/18    PT Start Time  0924    PT Stop Time  8786    PT Time Calculation (min)  51 min    Activity Tolerance  Patient tolerated treatment well    Behavior During Therapy  [facility] for tasks assessed/performed       Past Medical History:  Diagnosis Date  . Back pain   . Hypertension     Past Surgical History:  Procedure Laterality Date  . BACK SURGERY    . KNEE SURGERY    . LEFT HEART CATH AND CORONARY ANGIOGRAPHY N/A 05/20/2017   Procedure: LEFT HEART CATH AND CORONARY ANGIOGRAPHY;  Surgeon: Leonie Man, MD;  Location: [facility];  Service: Cardiovascular;  Laterality: N/A;    There were no vitals filed for this visit.   Subjective Assessment - 03/04/18 7672    Subjective  Patient reports bilateral shoulder pain for "awhile", reports recently that she is taking care of a 30 month old and she started having worse pain, she also reports having to cut her hair very short due to pain with doing hair    Limitations  House hold activities    Patient Stated Goals  have less pain    Currently in Pain?  Yes    Pain Score  7     Pain Location  Shoulder    Pain Orientation  Right;Left    Pain Type  Acute pain    Pain Onset  More than a month ago    Pain Frequency  Constant    Aggravating Factors   reaching, lying down flat, reports pain up to 10/10    Pain Relieving Factors  sitting in a recliner helps at best a 5/10    Effect of Pain on Daily Activities  difficulty doing any ADL's         [facility] PT Assessment - 03/04/18 0001      Assessment   Medical Diagnosis   bilateral shoulder pain    Referring Provider  [place]    Onset Date/Surgical Date  02/02/18    Prior Therapy  no      Precautions   Precautions  None      Balance Screen   Has the patient fallen in the past 6 months  No    Has the patient had a decrease in activity level because of a fear of falling?   No    Is the patient reluctant to leave their home because of a fear of falling?   No      Home Environment   Additional Comments  takes care of a 88 month old, tries to do her own housework      Prior Function   Level of Independence  Independent    Vocation  Unemployed;On disability    Vocation Requirements  back surgery 2006    Leisure  no exercise      Posture/Postural Control   Posture Comments  fwd head, rounded shoulders      ROM / Strength   AROM / PROM / Strength  AROM;PROM;Strength      AROM   Overall AROM Comments  all active motions increase the pain    AROM Assessment Site  Shoulder    Right/Left Shoulder  Right;Left    Right Shoulder Flexion  85 Degrees    Right Shoulder ABduction  80 Degrees    Right Shoulder Internal Rotation  20 Degrees    Right Shoulder External Rotation  45 Degrees    Left Shoulder Flexion  85 Degrees    Left Shoulder ABduction  80 Degrees    Left Shoulder Internal Rotation  20 Degrees    Left Shoulder External Rotation  45 Degrees      Strength   Overall Strength Comments  3/5 with pain      Palpation   Palpation comment  some crepitus of the shoulders with larger motions , she is very gaurded and resistant to motions, has significant spasms in the upper traps       Special Tests   Other special tests  could not get in the posistions for special tests due to pain                Objective measurements completed on examination: See above findings.      [facility] Adult PT Treatment/Exercise - 03/04/18 0001      Modalities   Modalities  Electrical Stimulation;Moist Heat      Moist Heat Therapy   Number Minutes Moist  Heat  15 Minutes    Moist Heat Location  Shoulder      Electrical Stimulation   Electrical Stimulation Location  bilateral shoulders    Electrical Stimulation Action  premod    Electrical Stimulation Parameters  sitting    Electrical Stimulation Goals  Pain             PT Education - 03/04/18 0944    Education Details  gentle AAROM for the shoulders    Person(s) Educated  Patient    Methods  Explanation;Demonstration;Handout    Comprehension  Verbalized understanding       PT Short Term Goals - 03/04/18 0949      PT SHORT TERM GOAL #1   Title  indepdendent with initial HEP    Time  2    Period  Weeks    Status  New        PT Long Term Goals - 03/04/18 0949      PT LONG TERM GOAL #1   Title  report able to do hair without pain >7/10    Time  8    Period  Weeks    Status  New      PT LONG TERM GOAL #2   Title  report able to dress without help    Time  8    Period  Weeks    Status  New      PT LONG TERM GOAL #3   Title  increase shoulder flexion to 120 degrees bilaterally    Time  8    Period  Weeks    Status  New      PT LONG TERM GOAL #4   Title  increase Ir bilaterally to 60 degrees    Time  8    Period  Weeks    Status  New             Plan - 03/04/18 0945    Clinical Impression Statement  Patient reports that she has been having shoulder pain for many months  and reports that she has had increased pain recently from caring for a 36 month old.  She has very limited ROM for both shoulders, reports that she cut all of her hair off due to inability to do her hair.  She reports having to get help to dress.  She reports that any motions increase the pain, her best position is sitting, lying down will alos increase the pain    Clinical Presentation  Stable    Clinical Decision Making  Low    Rehab Potential  Fair    PT Frequency  2x / week    PT Duration  8 weeks    PT Treatment/Interventions  ADLs/Self Care Home Management;Iontophoresis 4mg /ml  Dexamethasone;Cryotherapy;Electrical Stimulation;Moist Heat;Ultrasound;Therapeutic exercise;Therapeutic activities;Patient/family education;Manual techniques;Dry needling    PT Next Visit Plan  slowly start exercises    Consulted and Agree with Plan of Care  Patient       Patient will benefit from skilled therapeutic intervention in order to improve the following deficits and impairments:  Decreased range of motion, Impaired UE functional use, Increased muscle spasms, Pain, Decreased activity tolerance, Improper body mechanics, Postural dysfunction, Decreased strength  Visit Diagnosis: Acute pain of right shoulder - Plan: PT plan of care cert/re-cert  Acute pain of left shoulder - Plan: PT plan of care cert/re-cert  Stiffness of right shoulder, not elsewhere classified - Plan: PT plan of care cert/re-cert  Stiffness of left shoulder, not elsewhere classified - Plan: PT plan of care cert/re-cert     Problem List Patient Active Problem List   Diagnosis Date Noted  . Anxiety and depression 10/09/2017  . Abnormal stress test, catheterization done after that showed normal coronaries 05/17/2017  . Atypical chest pain 04/18/2017  . Dyspnea on exertion 04/18/2017  . Obstructive sleep apnea 04/18/2017  . Hypertension 04/08/2015  . Right shoulder pain 04/08/2015  . HYPOPOTASSEMIA 03/14/2009  . UNSPECIFIED DISORDER OF KIDNEY AND URETER 03/14/2009    Sumner Boast., PT 03/04/2018, 9:52 AM  [address] [place], [place], [address] Phone: [phone]   Fax:  [phone]  Name: Cathy [name] MRN: [id number] Date of Birth: Aug 07, 1960

## 2018-03-10 DIAGNOSIS — G4733 Obstructive sleep apnea (adult) (pediatric): Secondary | ICD-10-CM | POA: Diagnosis not present

## 2018-03-11 ENCOUNTER — Ambulatory Visit: Payer: Medicare Other | Attending: Family Medicine | Admitting: Physical Therapy

## 2018-03-11 ENCOUNTER — Encounter: Payer: Self-pay | Admitting: Physical Therapy

## 2018-03-11 DIAGNOSIS — M25611 Stiffness of right shoulder, not elsewhere classified: Secondary | ICD-10-CM | POA: Diagnosis not present

## 2018-03-11 DIAGNOSIS — M25612 Stiffness of left shoulder, not elsewhere classified: Secondary | ICD-10-CM | POA: Insufficient documentation

## 2018-03-11 DIAGNOSIS — M25512 Pain in left shoulder: Secondary | ICD-10-CM | POA: Insufficient documentation

## 2018-03-11 DIAGNOSIS — M25511 Pain in right shoulder: Secondary | ICD-10-CM | POA: Insufficient documentation

## 2018-03-11 NOTE — Therapy (Signed)
[place], [place], [address] Phone: [phone]   Fax:  [phone]  Physical Therapy Treatment  Patient Details  Name: Cathy [name] MRN: [id number] Date of Birth: 03-Dec-1960 Referring Provider: Margarita Rana   Encounter Date: 03/11/2018  PT End of Session - 03/11/18 1508    Visit Number  2    Date for PT Re-Evaluation  05/05/18    PT Start Time  1430    PT Stop Time  1510    PT Time Calculation (min)  40 min    Activity Tolerance  Patient tolerated treatment well    Behavior During Therapy  [facility] for tasks assessed/performed       Past Medical History:  Diagnosis Date  . Back pain   . Hypertension     Past Surgical History:  Procedure Laterality Date  . BACK SURGERY    . KNEE SURGERY    . LEFT HEART CATH AND CORONARY ANGIOGRAPHY N/A 05/20/2017   Procedure: LEFT HEART CATH AND CORONARY ANGIOGRAPHY;  Surgeon: Leonie Man, MD;  Location: [facility];  Service: Cardiovascular;  Laterality: N/A;    There were no vitals filed for this visit.  Subjective Assessment - 03/11/18 1429    Subjective  "I am still sore"     Currently in Pain?  Yes    Pain Score  5     Pain Location  Shoulder    Pain Orientation  Right;Left                       [facility] Adult PT Treatment/Exercise - 03/11/18 0001      Exercises   Exercises  Shoulder      Shoulder Exercises: Supine   Other Supine Exercises  IR/ER active x5      Shoulder Exercises: Standing   Flexion  20 reps;Weights;Both    Shoulder Flexion Weight (lbs)  1    ABduction  AROM;20 reps;Both    Extension  Theraband;20 reps;Both    Theraband Level (Shoulder Extension)  Level 1 (Yellow)    Row  Theraband;20 reps;Both    Theraband Level (Shoulder Row)  Level 1 (Yellow)    Other Standing Exercises  AAROM cane flex, ext, IR up back x10       Shoulder Exercises: ROM/Strengthening   UBE (Upper Arm Bike)  L1 3 min each way       Manual Therapy   Manual Therapy  Passive ROM    Passive ROM  Both shoulder all firections.               PT Short Term Goals - 03/04/18 0949      PT SHORT TERM GOAL #1   Title  indepdendent with initial HEP    Time  2    Period  Weeks    Status  New        PT Long Term Goals - 03/04/18 0949      PT LONG TERM GOAL #1   Title  report able to do hair without pain >7/10    Time  8    Period  Weeks    Status  New      PT LONG TERM GOAL #2   Title  report able to dress without help    Time  8    Period  Weeks    Status  New      PT LONG TERM GOAL #3  Title  increase shoulder flexion to 120 degrees bilaterally    Time  8    Period  Weeks    Status  New      PT LONG TERM GOAL #4   Title  increase Ir bilaterally to 60 degrees    Time  8    Period  Weeks    Status  New            Plan - 03/11/18 1509    Clinical Impression Statement  Pt tolerated an initial progression to TE fair. All exercises performed with limited ROM. PT reports increase shoulder pain above 90 deg flex and abd. Postural cues needed with rows and extensions. During MT pt very guarded, but relaxing as therapy progressed abler to achieve full PROM in bo th UEs    Rehab Potential  Fair    PT Frequency  2x / week    PT Duration  8 weeks    PT Next Visit Plan  slowly start exercises       Patient will benefit from skilled therapeutic intervention in order to improve the following deficits and impairments:  Decreased range of motion, Impaired UE functional use, Increased muscle spasms, Pain, Decreased activity tolerance, Improper body mechanics, Postural dysfunction, Decreased strength  Visit Diagnosis: Acute pain of right shoulder  Acute pain of left shoulder  Stiffness of right shoulder, not elsewhere classified  Stiffness of left shoulder, not elsewhere classified     Problem List Patient Active Problem List   Diagnosis Date Noted  . Anxiety and depression 10/09/2017   . Abnormal stress test, catheterization done after that showed normal coronaries 05/17/2017  . Atypical chest pain 04/18/2017  . Dyspnea on exertion 04/18/2017  . Obstructive sleep apnea 04/18/2017  . Hypertension 04/08/2015  . Right shoulder pain 04/08/2015  . HYPOPOTASSEMIA 03/14/2009  . UNSPECIFIED DISORDER OF KIDNEY AND URETER 03/14/2009    Scot Jun, PTA 03/11/2018, 3:11 PM  [address] [place], [place], [address] Phone: [phone]   Fax:  [phone]  Name: Keily Lepp MRN: [id number] Date of Birth: 1960/10/26

## 2018-03-14 ENCOUNTER — Ambulatory Visit: Payer: Medicare Other

## 2018-03-18 ENCOUNTER — Ambulatory Visit: Payer: Medicare Other | Admitting: Physical Therapy

## 2018-03-18 ENCOUNTER — Encounter: Payer: Self-pay | Admitting: Physical Therapy

## 2018-03-18 DIAGNOSIS — M25511 Pain in right shoulder: Secondary | ICD-10-CM

## 2018-03-18 DIAGNOSIS — M25612 Stiffness of left shoulder, not elsewhere classified: Secondary | ICD-10-CM

## 2018-03-18 DIAGNOSIS — M25611 Stiffness of right shoulder, not elsewhere classified: Secondary | ICD-10-CM

## 2018-03-18 DIAGNOSIS — M25512 Pain in left shoulder: Secondary | ICD-10-CM

## 2018-03-18 NOTE — Therapy (Addendum)
[place], [place], [address] Phone: [phone]   Fax:  [phone]  Physical Therapy Treatment  Patient Details  Name: Cathy [name] MRN: [id number] Date of Birth: 06-08-1961 Referring Provider: Margarita [name]   Encounter Date: 03/18/2018  PT End of Session - 03/18/18 1508    Visit Number  3    Date for PT Re-Evaluation  05/05/18    PT Start Time  1429    PT Stop Time  1510    PT Time Calculation (min)  41 min    Activity Tolerance  Patient tolerated treatment well    Behavior During Therapy  [facility] for tasks assessed/performed       Past Medical History:  Diagnosis Date  . Back pain   . Hypertension     Past Surgical History:  Procedure Laterality Date  . BACK SURGERY    . KNEE SURGERY    . LEFT HEART CATH AND CORONARY ANGIOGRAPHY N/A 05/20/2017   Procedure: LEFT HEART CATH AND CORONARY ANGIOGRAPHY;  Surgeon: Leonie Man, MD;  Location: [facility];  Service: Cardiovascular;  Laterality: N/A;    There were no vitals filed for this visit.  Subjective Assessment - 03/18/18 1429    Subjective  Pt reports that she has jt pain in her R shoulder when she reaches behind her back    Currently in Pain?  Yes    Pain Score  7     Pain Location  Shoulder    Pain Orientation  Right                       [facility] Adult PT Treatment/Exercise - 03/18/18 0001      Exercises   Exercises  Shoulder      Shoulder Exercises: Standing   Flexion  20 reps;Weights;Both    Shoulder Flexion Weight (lbs)  2    Flexion Limitations  1    ABduction  Strengthening;15 reps;Both;Weights    Extension  Theraband;20 reps;Both    Theraband Level (Shoulder Extension)  Level 2 (Red)    Row  Theraband;20 reps;Both    Theraband Level (Shoulder Row)  Level 1 (Yellow)    Other Standing Exercises  AAROM cane flex, ext, IR up back x10       Shoulder Exercises: ROM/Strengthening   UBE (Upper Arm  Bike)  L1 3 min each way    Other ROM/Strengthening Exercises  Rows & lats 20lb 2x10       Manual Therapy   Manual Therapy  Passive ROM    Passive ROM  Both shoulder all directions.               PT Short Term Goals - 03/04/18 0949      PT SHORT TERM GOAL #1   Title  indepdendent with initial HEP    Time  2    Period  Weeks    Status  New        PT Long Term Goals - 03/04/18 0949      PT LONG TERM GOAL #1   Title  report able to do hair without pain >7/10    Time  8    Period  Weeks    Status  New      PT LONG TERM GOAL #2   Title  report able to dress without help    Time  8    Period  Weeks  Status  New      PT LONG TERM GOAL #3   Title  increase shoulder flexion to 120 degrees bilaterally    Time  8    Period  Weeks    Status  New      PT LONG TERM GOAL #4   Title  increase Ir bilaterally to 60 degrees    Time  8    Period  Weeks    Status  New            Plan - 03/18/18 1509    Clinical Impression Statement  Pt with improved shoulder motion with today's interventions. Despite improved motion she reports 7/10 shoulder pain. Pt tolerated a progressed treatment session well. Again she was guarded initially with MT but able to relax and achieve full PROM on both shoulders.    Rehab Potential  Fair    PT Frequency  2x / week    PT Duration  8 weeks    PT Treatment/Interventions  ADLs/Self Care Home Management;Iontophoresis 70m/ml Dexamethasone;Cryotherapy;Electrical Stimulation;Moist Heat;Ultrasound;Therapeutic exercise;Therapeutic activities;Patient/family education;Manual techniques;Dry needling    PT Next Visit Plan  slowly start exercises       Patient will benefit from skilled therapeutic intervention in order to improve the following deficits and impairments:  Decreased range of motion, Impaired UE functional use, Increased muscle spasms, Pain, Decreased activity tolerance, Improper body mechanics, Postural dysfunction, Decreased  strength  Visit Diagnosis: Acute pain of left shoulder  Stiffness of right shoulder, not elsewhere classified  Acute pain of right shoulder  Stiffness of left shoulder, not elsewhere classified     Problem List Patient Active Problem List   Diagnosis Date Noted  . Anxiety and depression 10/09/2017  . Abnormal stress test, catheterization done after that showed normal coronaries 05/17/2017  . Atypical chest pain 04/18/2017  . Dyspnea on exertion 04/18/2017  . Obstructive sleep apnea 04/18/2017  . Hypertension 04/08/2015  . Right shoulder pain 04/08/2015  . HYPOPOTASSEMIA 03/14/2009  . UNSPECIFIED DISORDER OF KIDNEY AND URETER 03/14/2009   PHYSICAL THERAPY DISCHARGE SUMMARY  Visits from Start of Care: 3  Plan: Patient agrees to discharge.  Patient goals were not met. Patient is being discharged due to not returning since the last visit.  ?????       RScot Jun PTA 03/18/2018, 3:11 PM  [place] [address]Phone: [phone]  Fax:  [phone] Name: KFaryal MarxenMRN: [id number] of Birth: [birth year]

## 2018-03-20 ENCOUNTER — Ambulatory Visit: Payer: Medicare Other | Admitting: Physical Therapy

## 2018-03-23 DIAGNOSIS — G4733 Obstructive sleep apnea (adult) (pediatric): Secondary | ICD-10-CM | POA: Diagnosis not present

## 2018-03-24 ENCOUNTER — Ambulatory Visit: Payer: Medicare Other | Admitting: Family Medicine

## 2018-03-24 ENCOUNTER — Ambulatory Visit: Payer: Medicare Other | Admitting: Physical Therapy

## 2018-03-28 ENCOUNTER — Ambulatory Visit: Payer: Medicare Other | Attending: Family Medicine | Admitting: Family Medicine

## 2018-03-28 ENCOUNTER — Other Ambulatory Visit (HOSPITAL_COMMUNITY)
Admission: RE | Admit: 2018-03-28 | Discharge: 2018-03-28 | Disposition: A | Discharge status: Home / Self Care | Payer: Medicare Other | Source: Ambulatory Visit | Attending: Family Medicine | Admitting: Family Medicine

## 2018-03-28 ENCOUNTER — Other Ambulatory Visit: Payer: Self-pay | Admitting: Family Medicine

## 2018-03-28 ENCOUNTER — Encounter: Payer: Self-pay | Admitting: Family Medicine

## 2018-03-28 VITALS — BP 152/90 | HR 63 | Temp 98.2°F | Ht 64.0 in | Wt 261.6 lb

## 2018-03-28 DIAGNOSIS — Z1239 Encounter for other screening for malignant neoplasm of breast: Secondary | ICD-10-CM

## 2018-03-28 DIAGNOSIS — Z1211 Encounter for screening for malignant neoplasm of colon: Secondary | ICD-10-CM

## 2018-03-28 DIAGNOSIS — Z1231 Encounter for screening mammogram for malignant neoplasm of breast: Secondary | ICD-10-CM

## 2018-03-28 DIAGNOSIS — Z0000 Encounter for general adult medical examination without abnormal findings: Secondary | ICD-10-CM

## 2018-03-28 DIAGNOSIS — I10 Essential (primary) hypertension: Secondary | ICD-10-CM | POA: Insufficient documentation

## 2018-03-28 DIAGNOSIS — Z791 Long term (current) use of non-steroidal anti-inflammatories (NSAID): Secondary | ICD-10-CM | POA: Diagnosis not present

## 2018-03-28 DIAGNOSIS — R0609 Other forms of dyspnea: Secondary | ICD-10-CM | POA: Diagnosis not present

## 2018-03-28 DIAGNOSIS — Z124 Encounter for screening for malignant neoplasm of cervix: Secondary | ICD-10-CM

## 2018-03-28 DIAGNOSIS — Z79899 Other long term (current) drug therapy: Secondary | ICD-10-CM | POA: Insufficient documentation

## 2018-03-28 DIAGNOSIS — R05 Cough: Secondary | ICD-10-CM | POA: Diagnosis not present

## 2018-03-28 DIAGNOSIS — Z0001 Encounter for general adult medical examination with abnormal findings: Secondary | ICD-10-CM | POA: Insufficient documentation

## 2018-03-28 DIAGNOSIS — Z888 Allergy status to other drugs, medicaments and biological substances status: Secondary | ICD-10-CM | POA: Diagnosis not present

## 2018-03-28 DIAGNOSIS — Z9889 Other specified postprocedural states: Secondary | ICD-10-CM | POA: Insufficient documentation

## 2018-03-28 DIAGNOSIS — Z7982 Long term (current) use of aspirin: Secondary | ICD-10-CM | POA: Diagnosis not present

## 2018-03-28 DIAGNOSIS — Z23 Encounter for immunization: Secondary | ICD-10-CM

## 2018-03-28 NOTE — Progress Notes (Signed)
Subjective:  Patient ID: Cathy [name], female    DOB: Dec 08, 1960  Age: 57 y.o. MRN: [id number]  CC: Hypertension and Cough   HPI Cathy [name] presents for a complete physical exam.  She endorses persisting cough despite using her Proventil inhaler and denies smoking.  Also endorses being dyspneic while talking and gets into a coughing spell. Symptoms have been present for several months.  Past Medical History:  Diagnosis Date  . Back pain   . Hypertension     Past Surgical History:  Procedure Laterality Date  . BACK SURGERY    . KNEE SURGERY    . LEFT HEART CATH AND CORONARY ANGIOGRAPHY N/A 05/20/2017   Procedure: LEFT HEART CATH AND CORONARY ANGIOGRAPHY;  Surgeon: Leonie Man, MD;  Location: [facility];  Service: Cardiovascular;  Laterality: N/A;    Allergies  Allergen Reactions  . Isovue [Iopamidol] Tinitus    Sneezing and congestion post contrast administration, pt will require premeds for future contrasted exams      Outpatient Medications Prior to Visit  Medication Sig Dispense Refill  . albuterol (PROVENTIL HFA;VENTOLIN HFA) 108 (90 Base) MCG/ACT inhaler Inhale 1-2 puffs into the lungs every 6 (six) hours as needed for wheezing or shortness of breath. 1 Inhaler 3  . aspirin EC 81 MG tablet Take 81 mg by mouth 2 (two) times daily.    [name] atorvastatin (LIPITOR) 10 MG tablet Take 1 tablet (10 mg total) by mouth daily. 90 tablet 3  . cloNIDine (CATAPRES) 0.1 MG tablet Take 1 tablet (0.1 mg total) by mouth 2 (two) times daily. 60 tablet 3  . DULoxetine (CYMBALTA) 30 MG capsule Take 1 capsule (30 mg total) by mouth daily. 30 capsule 3  . fluticasone (FLOVENT HFA) 110 MCG/ACT inhaler Inhale 2 puffs into the lungs 2 (two) times daily.    . meloxicam (MOBIC) 7.5 MG tablet Take 1 tablet (7.5 mg total) by mouth daily. 30 tablet 3  . methocarbamol (ROBAXIN) 500 MG tablet Take 1 tablet (500 mg total) by mouth 4 (four) times daily. 60 tablet 3  . metoprolol tartrate  (LOPRESSOR) 25 MG tablet Take 1 tablet (25 mg total) by mouth 2 (two) times daily. 60 tablet 3  . Pumpkin Seed-Soy Germ (AZO BLADDER CONTROL/GO-LESS PO) Take 1 tablet by mouth daily.    . benzonatate (TESSALON) 100 MG capsule Take 1 capsule (100 mg total) by mouth every 8 (eight) hours. (Patient not taking: Reported on 03/28/2018) 21 capsule 0  . nitroGLYCERIN (NITROSTAT) 0.4 MG SL tablet Place 1 tablet (0.4 mg total) under the tongue every 5 (five) minutes as needed for chest pain. 25 tablet 11   Facility-Administered Medications Prior to Visit  Medication Dose Route Frequency Provider Last Rate Last Dose  . betamethasone acetate-betamethasone sodium phosphate (CELESTONE) injection 3 mg  3 mg Intramuscular Once Daylene Katayama M, DPM      . betamethasone acetate-betamethasone sodium phosphate (CELESTONE) injection 3 mg  3 mg Intramuscular Once Daylene Katayama M, DPM      . betamethasone acetate-betamethasone sodium phosphate (CELESTONE) injection 3 mg  3 mg Intramuscular Once Daylene Katayama M, DPM        ROS Review of Systems  Constitutional: Negative for activity change, appetite change and fatigue.  HENT: Negative for congestion, sinus pressure and sore throat.   Eyes: Negative for visual disturbance.  Respiratory: Positive for cough and shortness of breath. Negative for chest tightness and wheezing.   Cardiovascular: Negative for chest pain and palpitations.  Gastrointestinal: Negative for abdominal distention, abdominal pain and constipation.  Endocrine: Negative for polydipsia.  Genitourinary: Negative for dysuria and frequency.  Musculoskeletal: Negative for arthralgias and back pain.  Skin: Negative for rash.  Neurological: Negative for tremors, light-headedness and numbness.  Hematological: Does not bruise/bleed easily.  Psychiatric/Behavioral: Negative for agitation and behavioral problems.     Objective:  BP (!) 152/90   Pulse 63   Temp 98.2 F (36.8 C) (Oral)   Ht 5\' 4"  (1.626 m)    Wt 261 lb 9.6 oz (118.7 kg)   SpO2 96%   BMI 44.90 kg/m   BP/Weight 03/28/2018 2/44/0102 02/04/5365  Systolic BP 440 347 425  Diastolic BP 90 89 85  Wt. (Lbs) 261.6 263 258.4  BMI 44.9 45.14 44.35      Physical Exam  Constitutional: She is oriented to person, place, and time. She appears well-developed and well-nourished. No distress.  HENT:  Head: Normocephalic.  Right Ear: External ear normal.  Left Ear: External ear normal.  Nose: Nose normal.  Mouth/Throat: Oropharynx is clear and moist.  Eyes: Pupils are equal, round, and reactive to light. Conjunctivae and EOM are normal.  Neck: Normal range of motion. No JVD present.  Cardiovascular: Normal rate, regular rhythm, normal heart sounds and intact distal pulses. Exam reveals no gallop.  No murmur heard. Pulmonary/Chest: Effort normal and breath sounds normal. No respiratory distress. She has no wheezes. She has no rales. She exhibits no tenderness.  Abdominal: Soft. Bowel sounds are normal. She exhibits no distension and no mass. There is no tenderness.  Musculoskeletal: Normal range of motion. She exhibits no edema or tenderness.  Neurological: She is alert and oriented to person, place, and time. She has normal reflexes.  Skin: Skin is warm and dry. She is not diaphoretic.  Psychiatric: She has a normal mood and affect.     Assessment & Plan:   1. Annual physical exam Counseled on 150 minutes of exercise per week, healthy eating (including decreased daily intake of saturated fats, cholesterol, added sugars, sodium), STI prevention, routine healthcare maintenance. - Ambulatory referral to Gastroenterology  2. Screening for breast cancer - MM Digital Screening; Future  3. Screening for cervical cancer - Cytology - PAP([facility])  4. Dyspnea on exertion - Pulmonary function test; Future  5. Screening for colon cancer Referred to GI  6. Need for influenza vaccination Declines flu shot   No orders of the  defined types were placed in this encounter.   Follow-up: Return in about 3 months (around 06/28/2018) for follow up of chronic medical conditions.   Charlott Rakes MD

## 2018-03-28 NOTE — Patient Instructions (Signed)

## 2018-03-31 LAB — CYTOLOGY - PAP
Diagnosis: NEGATIVE
HPV: NOT DETECTED

## 2018-04-22 ENCOUNTER — Telehealth: Payer: Self-pay | Admitting: Family Medicine

## 2018-04-22 NOTE — Telephone Encounter (Signed)
Patient dropped of tags paperwork.

## 2018-04-23 ENCOUNTER — Ambulatory Visit
Admission: RE | Admit: 2018-04-23 | Discharge: 2018-04-23 | Disposition: A | Discharge status: Home / Self Care | Payer: Medicare Other | Source: Ambulatory Visit | Attending: Family Medicine | Admitting: Family Medicine

## 2018-04-23 DIAGNOSIS — Z1231 Encounter for screening mammogram for malignant neoplasm of breast: Secondary | ICD-10-CM

## 2018-04-23 DIAGNOSIS — G4733 Obstructive sleep apnea (adult) (pediatric): Secondary | ICD-10-CM | POA: Diagnosis not present

## 2018-04-30 ENCOUNTER — Encounter: Payer: Self-pay | Admitting: Family Medicine

## 2018-05-01 ENCOUNTER — Ambulatory Visit: Payer: Medicare Other | Attending: Family Medicine | Admitting: Family Medicine

## 2018-05-01 ENCOUNTER — Encounter: Payer: Self-pay | Admitting: Family Medicine

## 2018-05-01 VITALS — BP 156/88 | HR 61 | Temp 98.6°F | Resp 16 | Wt 260.4 lb

## 2018-05-01 DIAGNOSIS — Z888 Allergy status to other drugs, medicaments and biological substances status: Secondary | ICD-10-CM | POA: Insufficient documentation

## 2018-05-01 DIAGNOSIS — J029 Acute pharyngitis, unspecified: Secondary | ICD-10-CM | POA: Diagnosis not present

## 2018-05-01 DIAGNOSIS — R590 Localized enlarged lymph nodes: Secondary | ICD-10-CM

## 2018-05-01 DIAGNOSIS — M25512 Pain in left shoulder: Secondary | ICD-10-CM

## 2018-05-01 DIAGNOSIS — I10 Essential (primary) hypertension: Secondary | ICD-10-CM | POA: Insufficient documentation

## 2018-05-01 DIAGNOSIS — Z79899 Other long term (current) drug therapy: Secondary | ICD-10-CM | POA: Insufficient documentation

## 2018-05-01 DIAGNOSIS — Z7982 Long term (current) use of aspirin: Secondary | ICD-10-CM | POA: Insufficient documentation

## 2018-05-01 DIAGNOSIS — Z87891 Personal history of nicotine dependence: Secondary | ICD-10-CM | POA: Insufficient documentation

## 2018-05-01 MED ORDER — TRAMADOL HCL 50 MG PO TABS: 50.0000 mg | ORAL_TABLET | Freq: Three times a day (TID) | ORAL | 0 refills | Status: AC | PRN

## 2018-05-01 MED ORDER — AMOXICILLIN 500 MG PO CAPS: 500.0000 mg | ORAL_CAPSULE | Freq: Two times a day (BID) | ORAL | 0 refills | Status: DC

## 2018-05-01 NOTE — Progress Notes (Signed)
Subjective:    Patient ID: Cathy [name], female    DOB: 1961/05/11, 57 y.o.   MRN: [id number]  HPI 57 yo female who presents secondary to the complaint of swelling and pain in her neck that started this weekend. Patient reports that she came in on Monday but was not able to be seen. Patient states that she had some swelling in her neck, mostly right side/frontal below her chin and the area of swelling was very sore. Pain on the outside of her neck was about a 10 this weekend but then decreased to a 4-6 by Tuesday.  She also had a sore throat that has not been severe. Throat feels irritated and she has had some nasal congestion and a non-productive cough. She has been gargling with warm salt water. She denies any fever or chills. She has had no headache or dizziness.       Patient has also had recent acute on chronic left posterior shoulder pain and she is unable to sleep on her left shoulder/side without an increase in pain. Patient denies any recent injury to her shoulder. Pain increases with certain movements of her arm/shoulder such as trying to reach overhead or attempting to reach behind her.  Patient states that her shoulder has a constant dull ache that is about a 6 on a 0-to-10 scale and pain can increase to a 9 or 10 with activity.  Past Medical History:  Diagnosis Date  . Back pain   . Hypertension    Past Surgical History:  Procedure Laterality Date  . BACK SURGERY    . KNEE SURGERY    . LEFT HEART CATH AND CORONARY ANGIOGRAPHY N/A 05/20/2017   Procedure: LEFT HEART CATH AND CORONARY ANGIOGRAPHY;  Surgeon: Leonie Man, MD;  Location: [facility];  Service: Cardiovascular;  Laterality: N/A;   Family History  Problem Relation Age of Onset  . Stroke Mother    Social History   Tobacco Use  . Smoking status: Former Research scientist (life sciences)  . Smokeless tobacco: Never Used  Substance Use Topics  . Alcohol use: No  . Drug use: No   Allergies  Allergen Reactions  . Isovue  [Iopamidol] Tinitus    Sneezing and congestion post contrast administration, pt will require premeds for future contrasted exams    Review of Systems  Constitutional: Positive for fatigue. Negative for chills and fever.  HENT: Positive for congestion and sore throat. Negative for ear pain, facial swelling, rhinorrhea, trouble swallowing and voice change.   Respiratory: Positive for cough. Negative for shortness of breath.   Cardiovascular: Negative for chest pain, palpitations and leg swelling.  Gastrointestinal: Negative for abdominal pain and nausea.  Genitourinary: Negative for dysuria and frequency.  Musculoskeletal: Positive for arthralgias (recurrent left shoudler pain) and neck pain. Negative for neck stiffness.  Neurological: Negative for dizziness and headaches.      Objective:   Physical Exam BP (!) 156/88   Pulse 61   Temp 98.6 F (37 C) (Oral)   Resp 16   Wt 260 lb 6.4 oz (118.1 kg)   SpO2 95%   BMI 44.70 kg/m   Nurse's note and vital signs reviewed General-well-nourished, well-developed older female in no acute distress but patient appears to be fatigued ENT- TMs dull, patient with edema of the nasal turbinates with mild clear to white nasal discharge, patient with mild posterior pharynx erythema Neck- patient with some right-sided cervical lymphadenopathy which is tender to palpation.  Patient is able to move  her neck and can place her chin on her chest Lungs-clear to auscultation bilaterally Cardiovascular-regular rate and rhythm Abdomen-soft, nontender Back-no CVA tenderness Extremities-no edema Musculoskeletal-patient with tenderness at the left [facility] joint as well as left posterior lateral shoulder to palpation.  Patient with positive empty can sign on the left and patient with complaint of pain with attempts at overhead range of motion of the shoulder   Assessment and plan   1. Sore throat Patient with complaint of recent sore throat and onset of neck pain and  patient with evidence of cervical adenopathy on exam.  Patient states that her grandchildren have been sick and patient will therefore be checked for strep as well.  Patient will be placed on amoxicillin.  Patient is encouraged to gargle with warm salt water and take over-the-counter Tylenol or Motrin as needed for throat pain. - Rapid Strep A - amoxicillin (AMOXIL) 500 MG capsule; Take 1 capsule (500 mg total) by mouth 2 (two) times daily.  Dispense: 20 capsule; Refill: 0  2. Cervical lymphadenopathy Patient with cervical lymphadenopathy.  Patient will be placed on amoxicillin in case she does have sore throat or bacterial origin which is contributing to her cervical lymphadenopathy.  Patient may take Tylenol or Motrin as needed for neck pain and patient may also use warm moist heat to the neck area.  If patient has any worsening of sore throat, feels as if she has difficulty swallowing or feels as if she has worsening of neck pain, patient may wish to go to the emergency room for further evaluation.  Patient however reports that she is feeling better today than she did a few days ago.  3. Acute pain of left shoulder Patient with acute on chronic left shoulder pain and patient likely with rotator cuff tendinopathy.  Prescription provided for tramadol to take for severe, acute pain.  Patient was asked to consider referral to orthopedics if her shoulder pain is not improving. - traMADol (ULTRAM) 50 MG tablet; Take 1 tablet (50 mg total) by mouth every 8 (eight) hours as needed for up to 5 days.  Dispense: 15 tablet; Refill: 0  An After Visit Summary was printed and given to the patient.  Allergies as of 05/01/2018      Reactions   Isovue [iopamidol] Tinitus   Sneezing and congestion post contrast administration, pt will require premeds for future contrasted exams      Medication List        Accurate as of 05/01/18 11:59 PM. Always use your most recent med list.          albuterol 108 (90  Base) MCG/ACT inhaler Commonly known as:  PROVENTIL HFA;VENTOLIN HFA Inhale 1-2 puffs into the lungs every 6 (six) hours as needed for wheezing or shortness of breath.   amoxicillin 500 MG capsule Commonly known as:  AMOXIL Take 1 capsule (500 mg total) by mouth 2 (two) times daily.   aspirin EC 81 MG tablet Take 81 mg by mouth 2 (two) times daily.   atorvastatin 10 MG tablet Commonly known as:  LIPITOR Take 1 tablet (10 mg total) by mouth daily.   AZO BLADDER CONTROL/GO-LESS PO Take 1 tablet by mouth daily.   benzonatate 100 MG capsule Commonly known as:  TESSALON Take 1 capsule (100 mg total) by mouth every 8 (eight) hours.   cloNIDine 0.1 MG tablet Commonly known as:  CATAPRES Take 1 tablet (0.1 mg total) by mouth 2 (two) times daily.   DULoxetine 30 MG  capsule Commonly known as:  CYMBALTA Take 1 capsule (30 mg total) by mouth daily.   fluticasone 110 MCG/ACT inhaler Commonly known as:  FLOVENT HFA Inhale 2 puffs into the lungs 2 (two) times daily.   meloxicam 7.5 MG tablet Commonly known as:  MOBIC Take 1 tablet (7.5 mg total) by mouth daily.   methocarbamol 500 MG tablet Commonly known as:  ROBAXIN Take 1 tablet (500 mg total) by mouth 4 (four) times daily.   metoprolol tartrate 25 MG tablet Commonly known as:  LOPRESSOR Take 1 tablet (25 mg total) by mouth 2 (two) times daily.   nitroGLYCERIN 0.4 MG SL tablet Commonly known as:  NITROSTAT Place 1 tablet (0.4 mg total) under the tongue every 5 (five) minutes as needed for chest pain.   traMADol 50 MG tablet Commonly known as:  ULTRAM Take 1 tablet (50 mg total) by mouth every 8 (eight) hours as needed for up to 5 days.       Return in about 1 week (around 05/08/2018) for shoulder pain-Dr. Margarita Rana.

## 2018-05-07 ENCOUNTER — Encounter: Payer: Self-pay | Admitting: Critical Care Medicine

## 2018-05-07 ENCOUNTER — Ambulatory Visit: Payer: Medicare Other | Attending: Critical Care Medicine | Admitting: Critical Care Medicine

## 2018-05-07 VITALS — BP 140/86 | HR 61 | Temp 99.0°F | Resp 16 | Wt 265.0 lb

## 2018-05-07 DIAGNOSIS — Z79899 Other long term (current) drug therapy: Secondary | ICD-10-CM | POA: Insufficient documentation

## 2018-05-07 DIAGNOSIS — Z6841 Body Mass Index (BMI) 40.0 and over, adult: Secondary | ICD-10-CM | POA: Insufficient documentation

## 2018-05-07 DIAGNOSIS — Z823 Family history of stroke: Secondary | ICD-10-CM | POA: Insufficient documentation

## 2018-05-07 DIAGNOSIS — Z87891 Personal history of nicotine dependence: Secondary | ICD-10-CM | POA: Diagnosis not present

## 2018-05-07 DIAGNOSIS — R0609 Other forms of dyspnea: Secondary | ICD-10-CM | POA: Diagnosis not present

## 2018-05-07 DIAGNOSIS — I10 Essential (primary) hypertension: Secondary | ICD-10-CM | POA: Diagnosis not present

## 2018-05-07 DIAGNOSIS — Z7951 Long term (current) use of inhaled steroids: Secondary | ICD-10-CM | POA: Diagnosis not present

## 2018-05-07 DIAGNOSIS — Z8249 Family history of ischemic heart disease and other diseases of the circulatory system: Secondary | ICD-10-CM | POA: Diagnosis not present

## 2018-05-07 DIAGNOSIS — Z7982 Long term (current) use of aspirin: Secondary | ICD-10-CM | POA: Insufficient documentation

## 2018-05-07 DIAGNOSIS — Z791 Long term (current) use of non-steroidal anti-inflammatories (NSAID): Secondary | ICD-10-CM | POA: Insufficient documentation

## 2018-05-07 DIAGNOSIS — G4733 Obstructive sleep apnea (adult) (pediatric): Secondary | ICD-10-CM | POA: Diagnosis not present

## 2018-05-07 DIAGNOSIS — J4540 Moderate persistent asthma, uncomplicated: Secondary | ICD-10-CM | POA: Insufficient documentation

## 2018-05-07 DIAGNOSIS — E66813 Obesity, class 3: Secondary | ICD-10-CM | POA: Insufficient documentation

## 2018-05-07 DIAGNOSIS — Z91041 Radiographic dye allergy status: Secondary | ICD-10-CM | POA: Diagnosis not present

## 2018-05-07 MED ORDER — ALBUTEROL SULFATE HFA 108 (90 BASE) MCG/ACT IN AERS: 1.0000 | INHALATION_SPRAY | Freq: Four times a day (QID) | RESPIRATORY_TRACT | 6 refills | Status: DC | PRN

## 2018-05-07 MED ORDER — FLUTICASONE PROPIONATE HFA 110 MCG/ACT IN AERO: 2.0000 | INHALATION_SPRAY | Freq: Two times a day (BID) | RESPIRATORY_TRACT | 6 refills | Status: DC

## 2018-05-07 MED FILL — FLOVENT HFA 110 MCG INHALER: 110 | 30 days supply | Qty: 12 | Fill #0

## 2018-05-07 NOTE — Assessment & Plan Note (Signed)
Significant obesity with BMI greater than 40 this is contributing to her shortness of breath Plan Significant weight loss program advised

## 2018-05-07 NOTE — Patient Instructions (Addendum)
Flovent will be started take this 2 puffs twice daily Use albuterol 2 puffs every 4-6 hours as needed for shortness of breath A pulmonary function study will be obtained Focus on weight loss including following the weight loss program as outlined below Focus on increasing vegetables and salads and reducing your bread consumption and avoiding fried chicken Continue using the CPAP machine particularly during the day and early at night   Calorie Counting for Weight Loss Calories are units of energy. Your body needs a certain amount of calories from food to keep you going throughout the day. When you eat more calories than your body needs, your body stores the extra calories as fat. When you eat fewer calories than your body needs, your body burns fat to get the energy it needs. Calorie counting means keeping track of how many calories you eat and drink each day. Calorie counting can be helpful if you need to lose weight. If you make sure to eat fewer calories than your body needs, you should lose weight. Ask your health care provider what a healthy weight is for you. For calorie counting to work, you will need to eat the right number of calories in a day in order to lose a healthy amount of weight per week. A dietitian can help you determine how many calories you need in a day and will give you suggestions on how to reach your calorie goal.  A healthy amount of weight to lose per week is usually 1-2 lb (0.5-0.9 kg). This usually means that your daily calorie intake should be reduced by 500-750 calories.  Eating 1,200 - 1,500 calories per day can help most women lose weight.  Eating 1,500 - 1,800 calories per day can help most men lose weight.  What is my plan? My goal is to have __________ calories per day. If I have this many calories per day, I should lose around __________ pounds per week. What do I need to know about calorie counting? In order to meet your daily calorie goal, you will need  to:  Find out how many calories are in each food you would like to eat. Try to do this before you eat.  Decide how much of the food you plan to eat.  Write down what you ate and how many calories it had. Doing this is called keeping a food log.  To successfully lose weight, it is important to balance calorie counting with a healthy lifestyle that includes regular activity. Aim for 150 minutes of moderate exercise (such as walking) or 75 minutes of vigorous exercise (such as running) each week. Where do I find calorie information?  The number of calories in a food can be found on a Nutrition Facts label. If a food does not have a Nutrition Facts label, try to look up the calories online or ask your dietitian for help. Remember that calories are listed per serving. If you choose to have more than one serving of a food, you will have to multiply the calories per serving by the amount of servings you plan to eat. For example, the label on a package of bread might say that a serving size is 1 slice and that there are 90 calories in a serving. If you eat 1 slice, you will have eaten 90 calories. If you eat 2 slices, you will have eaten 180 calories. How do I keep a food log? Immediately after each meal, record the following information in your food log:  What you ate. Don't forget to include toppings, sauces, and other extras on the food.  How much you ate. This can be measured in cups, ounces, or number of items.  How many calories each food and drink had.  The total number of calories in the meal.  Keep your food log near you, such as in a small notebook in your pocket, or use a mobile app or website. Some programs will calculate calories for you and show you how many calories you have left for the day to meet your goal. What are some calorie counting tips?  Use your calories on foods and drinks that will fill you up and not leave you hungry: ? Some examples of foods that fill you up are nuts  and nut butters, vegetables, lean proteins, and high-fiber foods like whole grains. High-fiber foods are foods with more than 5 g fiber per serving. ? Drinks such as sodas, specialty coffee drinks, alcohol, and juices have a lot of calories, yet do not fill you up.  Eat nutritious foods and avoid empty calories. Empty calories are calories you get from foods or beverages that do not have many vitamins or protein, such as candy, sweets, and soda. It is better to have a nutritious high-calorie food (such as an avocado) than a food with few nutrients (such as a bag of chips).  Know how many calories are in the foods you eat most often. This will help you calculate calorie counts faster.  Pay attention to calories in drinks. Low-calorie drinks include water and unsweetened drinks.  Pay attention to nutrition labels for "low fat" or "fat free" foods. These foods sometimes have the same amount of calories or more calories than the full fat versions. They also often have added sugar, starch, or salt, to make up for flavor that was removed with the fat.  Find a way of tracking calories that works for you. Get creative. Try different apps or programs if writing down calories does not work for you. What are some portion control tips?  Know how many calories are in a serving. This will help you know how many servings of a certain food you can have.  Use a measuring cup to measure serving sizes. You could also try weighing out portions on a kitchen scale. With time, you will be able to estimate serving sizes for some foods.  Take some time to put servings of different foods on your favorite plates, bowls, and cups so you know what a serving looks like.  Try not to eat straight from a bag or box. Doing this can lead to overeating. Put the amount you would like to eat in a cup or on a plate to make sure you are eating the right portion.  Use smaller plates, glasses, and bowls to prevent overeating.  Try  not to multitask (for example, watch TV or use your computer) while eating. If it is time to eat, sit down at a table and enjoy your food. This will help you to know when you are full. It will also help you to be aware of what you are eating and how much you are eating. What are tips for following this plan? Reading food labels  Check the calorie count compared to the serving size. The serving size may be smaller than what you are used to eating.  Check the source of the calories. Make sure the food you are eating is high in vitamins and protein and low  in saturated and trans fats. Shopping  Read nutrition labels while you shop. This will help you make healthy decisions before you decide to purchase your food.  Make a grocery list and stick to it. Cooking  Try to cook your favorite foods in a healthier way. For example, try baking instead of frying.  Use low-fat dairy products. Meal planning  Use more fruits and vegetables. Half of your plate should be fruits and vegetables.  Include lean proteins like poultry and fish. How do I count calories when eating out?  Ask for smaller portion sizes.  Consider sharing an entree and sides instead of getting your own entree.  If you get your own entree, eat only half. Ask for a box at the beginning of your meal and put the rest of your entree in it so you are not tempted to eat it.  If calories are listed on the menu, choose the lower calorie options.  Choose dishes that include vegetables, fruits, whole grains, low-fat dairy products, and lean protein.  Choose items that are boiled, broiled, grilled, or steamed. Stay away from items that are buttered, battered, fried, or served with cream sauce. Items labeled "crispy" are usually fried, unless stated otherwise.  Choose water, low-fat milk, unsweetened iced tea, or other drinks without added sugar. If you want an alcoholic beverage, choose a lower calorie option such as a glass of wine or  light beer.  Ask for dressings, sauces, and syrups on the side. These are usually high in calories, so you should limit the amount you eat.  If you want a salad, choose a garden salad and ask for grilled meats. Avoid extra toppings like bacon, cheese, or fried items. Ask for the dressing on the side, or ask for olive oil and vinegar or lemon to use as dressing.  Estimate how many servings of a food you are given. For example, a serving of cooked rice is  cup or about the size of half a baseball. Knowing serving sizes will help you be aware of how much food you are eating at restaurants. The list below tells you how big or small some common portion sizes are based on everyday objects: ? 1 oz-4 stacked dice. ? 3 oz-1 deck of cards. ? 1 tsp-1 die. ? 1 Tbsp- a ping-pong ball. ? 2 Tbsp-1 ping-pong ball. ?  cup- baseball. ? 1 cup-1 baseball. Summary  Calorie counting means keeping track of how many calories you eat and drink each day. If you eat fewer calories than your body needs, you should lose weight.  A healthy amount of weight to lose per week is usually 1-2 lb (0.5-0.9 kg). This usually means reducing your daily calorie intake by 500-750 calories.  The number of calories in a food can be found on a Nutrition Facts label. If a food does not have a Nutrition Facts label, try to look up the calories online or ask your dietitian for help.  Use your calories on foods and drinks that will fill you up, and not on foods and drinks that will leave you hungry.  Use smaller plates, glasses, and bowls to prevent overeating. This information is not intended to replace advice given to you by your health care provider. Make sure you discuss any questions you have with your health care provider. Document Released: 07/23/2005 Document Revised: 06/22/2016 Document Reviewed: 06/22/2016 Elsevier Interactive Patient Education  [employer].  Return 6 weeks

## 2018-05-07 NOTE — Assessment & Plan Note (Signed)
Obstructive sleep apnea to moderate degree improved with CPAP therapy Plan Maintain CPAP at 5 to 15 cm water pressure use at night and as needed during the day when resting Weight loss advised

## 2018-05-07 NOTE — Assessment & Plan Note (Addendum)
Moderate persistent asthma with environmental triggers and associated obesity No evidence of reflux disease No evidence of mold environment in the home No cardiac evaluation in October 2018 only showed mild coronary disease which is being treated medically and normal echocardiogram Plan Begin Flovent HFA 2 puffs twice daily Use albuterol inhaler 2 puffs every 4-6 hours as needed for shortness of breath Obtain pulmonary function study Return in 6 weeks

## 2018-05-07 NOTE — Progress Notes (Signed)
Subjective:    Patient ID: Cathy [name], female    DOB: Mar 04, 1961, 57 y.o.   MRN: [id number]  57 y.o.F here for dyspnea evaluation. Pt with OSA on cpap via [facility]  5-15cmh20 has humidity  Breakfast Egg Mcmuffin (no meat)  Lunch Chicken sandwich Dinner: vegie patty    Shortness of Breath  This is a chronic problem. The current episode started more than 1 year ago. The problem occurs daily (exertion and at night.  ). The problem has been gradually worsening. Duration: can walk only 4ft and has to stop up steps is worse, can take 47min to recover  Associated symptoms include orthopnea, PND, sputum production, swollen glands and wheezing. Pertinent negatives include no abdominal pain, chest pain, claudication, coryza, ear pain, headaches, hemoptysis, leg pain, leg swelling, neck pain, rash, rhinorrhea, sore throat, syncope or vomiting. The symptoms are aggravated by smoke, weather changes, pollens, lying flat, any activity, eating and exercise. Associated symptoms comments: Has chronic cough, mucus comes out and slightly yellow . Risk factors include smoking. She has tried beta agonist inhalers for the symptoms. The treatment provided mild relief. There is no history of allergies, aspirin allergies, asthma, CAD, chronic lung disease, COPD, DVT, a heart failure, PE, pneumonia or a recent surgery.    Past Medical History:  Diagnosis Date  . Back pain   . Hypertension      Family History  Problem Relation Age of Onset  . Stroke Mother   . Cancer Father   . Heart disease Brother      Social History   Socioeconomic History  . Marital status: Divorced    Spouse name: Not on file  . Number of children: Not on file  . Years of education: Not on file  . Highest education level: Not on file  Occupational History  . Not on file  Social Needs  . Financial resource strain: Not on file  . Food insecurity:    Worry: Not on file    Inability: Not on file  . Transportation needs:   Medical: Not on file    Non-medical: Not on file  Tobacco Use  . Smoking status: Former Research scientist (life sciences)  . Smokeless tobacco: Never Used  Substance and Sexual Activity  . Alcohol use: No  . Drug use: No  . Sexual activity: Not on file  Lifestyle  . Physical activity:    Days per week: Not on file    Minutes per session: Not on file  . Stress: Not on file  Relationships  . Social connections:    Talks on phone: Not on file    Gets together: Not on file    Attends religious service: Not on file    Active member of club or organization: Not on file    Attends meetings of clubs or organizations: Not on file    Relationship status: Not on file  . Intimate partner violence:    Fear of current or ex partner: Not on file    Emotionally abused: Not on file    Physically abused: Not on file    Forced sexual activity: Not on file  Other Topics Concern  . Not on file  Social History Narrative  . Not on file     Allergies  Allergen Reactions  . Isovue [Iopamidol] Tinitus    Sneezing and congestion post contrast administration, pt will require premeds for future contrasted exams     Outpatient Medications Prior to Visit  Medication Sig Dispense Refill  .  amoxicillin (AMOXIL) 500 MG capsule Take 1 capsule (500 mg total) by mouth 2 (two) times daily. 20 capsule 0  . aspirin EC 81 MG tablet Take 81 mg by mouth 2 (two) times daily.    [name] atorvastatin (LIPITOR) 10 MG tablet Take 1 tablet (10 mg total) by mouth daily. 90 tablet 3  . benzonatate (TESSALON) 100 MG capsule Take 1 capsule (100 mg total) by mouth every 8 (eight) hours. 21 capsule 0  . cloNIDine (CATAPRES) 0.1 MG tablet Take 1 tablet (0.1 mg total) by mouth 2 (two) times daily. 60 tablet 3  . DULoxetine (CYMBALTA) 30 MG capsule Take 1 capsule (30 mg total) by mouth daily. 30 capsule 3  . meloxicam (MOBIC) 7.5 MG tablet Take 1 tablet (7.5 mg total) by mouth daily. 30 tablet 3  . metoprolol tartrate (LOPRESSOR) 25 MG tablet Take 1 tablet  (25 mg total) by mouth 2 (two) times daily. 60 tablet 3  . Pumpkin Seed-Soy Germ (AZO BLADDER CONTROL/GO-LESS PO) Take 1 tablet by mouth daily.    [name] albuterol (PROVENTIL HFA;VENTOLIN HFA) 108 (90 Base) MCG/ACT inhaler Inhale 1-2 puffs into the lungs every 6 (six) hours as needed for wheezing or shortness of breath. 1 Inhaler 3  . methocarbamol (ROBAXIN) 500 MG tablet Take 1 tablet (500 mg total) by mouth 4 (four) times daily. (Patient not taking: Reported on 05/07/2018) 60 tablet 3  . nitroGLYCERIN (NITROSTAT) 0.4 MG SL tablet Place 1 tablet (0.4 mg total) under the tongue every 5 (five) minutes as needed for chest pain. 25 tablet 11  . fluticasone (FLOVENT HFA) 110 MCG/ACT inhaler Inhale 2 puffs into the lungs 2 (two) times daily.     Facility-Administered Medications Prior to Visit  Medication Dose Route Frequency Provider Last Rate Last Dose  . betamethasone acetate-betamethasone sodium phosphate (CELESTONE) injection 3 mg  3 mg Intramuscular Once Daylene Katayama M, DPM      . betamethasone acetate-betamethasone sodium phosphate (CELESTONE) injection 3 mg  3 mg Intramuscular Once Daylene Katayama M, DPM      . betamethasone acetate-betamethasone sodium phosphate (CELESTONE) injection 3 mg  3 mg Intramuscular Once Edrick Kins, DPM         Review of Systems  HENT: Negative for ear pain, rhinorrhea and sore throat.   Respiratory: Positive for sputum production, shortness of breath and wheezing. Negative for hemoptysis.   Cardiovascular: Positive for orthopnea and PND. Negative for chest pain, claudication, leg swelling and syncope.  Gastrointestinal: Negative for abdominal pain and vomiting.  Musculoskeletal: Negative for neck pain.  Skin: Negative for rash.  Neurological: Negative for headaches.       Objective:   Physical Exam Vitals:   05/07/18 0908  BP: 140/86  Pulse: 61  Resp: 16  Temp: 99 F (37.2 C)  TempSrc: Oral  SpO2: 97%  Weight: 265 lb (120.2 kg)    Gen: Pleasant,  obese, in no distress,  normal affect  ENT: No lesions,  mouth clear,  oropharynx clear, no postnasal drip  Neck: No JVD, no TMG, no carotid bruits  Lungs: No use of accessory muscles, no dullness to percussion, distant breath sounds with mild expiratory wheeze  Cardiovascular: RRR, heart sounds normal, no murmur or gallops, no peripheral edema  Abdomen: soft and NT, no HSM,  BS normal  Musculoskeletal: No deformities, no cyanosis or clubbing  Neuro: alert, non focal  Skin: Warm, no lesions or rashes  No results found.    CXR 07/2017: IMPRESSION: No active cardiopulmonary  disease.  Echo: 05/2017 Study Conclusions  - Left ventricle: The cavity size was normal. Wall thickness was   increased in a pattern of mild LVH. Systolic function was normal.   The estimated ejection fraction was in the range of 60% to 65%.   Doppler parameters are consistent with abnormal left ventricular   relaxation (grade 1 diastolic dysfunction). - Mitral valve: Calcified annulus. - Left atrium: The atrium was mildly dilated.  Cardiac Cath 05/2017 Normal  Ost RCA lesion, 30 %stenosed -- catheter related spasm.  Otherwise Angiographically normal coronary arteries.  The left ventricular systolic function is normal. The left ventricular ejection fraction is 55-65% by visual estimate.  LV end diastolic pressure is normal.  Tortuous Innominate Artery  Sleep Study 10/2017: IMPRESSIONS - Mild obstructive sleep apnea occurred during this study (AHI =  8.1/h). - No significant central sleep apnea occurred during this study  (CAI = 0.0/h). - Mild oxygen desaturation was noted during this study (Min O2 =  85.0%). Mean 94% - The patient snored with moderate snoring volume. - No cardiac abnormalities were noted during this study. - Clinically significant periodic limb movements did not occur  during sleep. No significant associated arousals.  DIAGNOSIS - Obstructive Sleep Apnea (327.23  [G47.33 ICD-10])  RECOMMENDATIONS - Treatment for mild OSA is directed at symptoms. If conservative  measures are insufficient, consider CPAP or a fitted oral  appliance, based on clinical judgment. - Be careful with alcohol, sedatives and other CNS depressants  that may worsen sleep apnea and disrupt normal sleep  architecture. - Sleep hygiene should be reviewed to assess factors that may  improve sleep quality. - Weight management and regular exercise should be initiated or  continued if appropriate.   Assessment & Plan:  I personally reviewed all images and lab data in the [facility] system as well as any outside material available during this office visit and agree with the  radiology impressions.   Asthma, moderate persistent Moderate persistent asthma with environmental triggers and associated obesity No evidence of reflux disease No evidence of mold environment in the home No cardiac evaluation in October 2018 only showed mild coronary disease which is being treated medically and normal echocardiogram Plan Begin Flovent HFA 2 puffs twice daily Use albuterol inhaler 2 puffs every 4-6 hours as needed for shortness of breath Obtain pulmonary function study Return in 6 weeks   Obstructive sleep apnea Obstructive sleep apnea to moderate degree improved with CPAP therapy Plan Maintain CPAP at 5 to 15 cm water pressure use at night and as needed during the day when resting Weight loss advised  Obesity, Class III, BMI 40-49.9 (morbid obesity) ([place]) Significant obesity with BMI greater than 40 this is contributing to her shortness of breath Plan Significant weight loss program advised   Ricketta was seen today for shortness of breath.  Diagnoses and all orders for this visit:  Moderate persistent asthma without complication -     Pulmonary Function Test; Future  Dyspnea on exertion -     albuterol (PROVENTIL HFA;VENTOLIN HFA) 108 (90 Base) MCG/ACT inhaler; Inhale 1-2 puffs into  the lungs every 6 (six) hours as needed for wheezing or shortness of breath. -     fluticasone (FLOVENT HFA) 110 MCG/ACT inhaler; Inhale 2 puffs into the lungs 2 (two) times daily.  Obstructive sleep apnea  Obesity, Class III, BMI 40-49.9 (morbid obesity) ([place])    I had an extended discussion with the patient and or family lasting 10 minutes of a  25 minute visit including: Viewing weight loss strategy and proper inhaler technique

## 2018-05-23 DIAGNOSIS — G4733 Obstructive sleep apnea (adult) (pediatric): Secondary | ICD-10-CM | POA: Diagnosis not present

## 2018-06-16 ENCOUNTER — Encounter: Payer: Self-pay | Admitting: Critical Care Medicine

## 2018-06-16 ENCOUNTER — Ambulatory Visit: Payer: Medicare Other | Attending: Critical Care Medicine | Admitting: Critical Care Medicine

## 2018-06-16 VITALS — BP 153/91 | HR 55 | Temp 98.5°F | Resp 16 | Ht 64.0 in | Wt 262.0 lb

## 2018-06-16 DIAGNOSIS — M25511 Pain in right shoulder: Secondary | ICD-10-CM | POA: Insufficient documentation

## 2018-06-16 DIAGNOSIS — Z7982 Long term (current) use of aspirin: Secondary | ICD-10-CM | POA: Insufficient documentation

## 2018-06-16 DIAGNOSIS — Z888 Allergy status to other drugs, medicaments and biological substances status: Secondary | ICD-10-CM | POA: Diagnosis not present

## 2018-06-16 DIAGNOSIS — M25512 Pain in left shoulder: Secondary | ICD-10-CM | POA: Insufficient documentation

## 2018-06-16 DIAGNOSIS — J4540 Moderate persistent asthma, uncomplicated: Secondary | ICD-10-CM

## 2018-06-16 DIAGNOSIS — Z87891 Personal history of nicotine dependence: Secondary | ICD-10-CM | POA: Insufficient documentation

## 2018-06-16 DIAGNOSIS — G4733 Obstructive sleep apnea (adult) (pediatric): Secondary | ICD-10-CM

## 2018-06-16 DIAGNOSIS — F419 Anxiety disorder, unspecified: Secondary | ICD-10-CM

## 2018-06-16 DIAGNOSIS — G8929 Other chronic pain: Secondary | ICD-10-CM

## 2018-06-16 DIAGNOSIS — F32A Depression, unspecified: Secondary | ICD-10-CM

## 2018-06-16 DIAGNOSIS — Z79899 Other long term (current) drug therapy: Secondary | ICD-10-CM | POA: Diagnosis not present

## 2018-06-16 DIAGNOSIS — R0789 Other chest pain: Secondary | ICD-10-CM | POA: Diagnosis not present

## 2018-06-16 DIAGNOSIS — M549 Dorsalgia, unspecified: Secondary | ICD-10-CM | POA: Diagnosis not present

## 2018-06-16 DIAGNOSIS — Z8249 Family history of ischemic heart disease and other diseases of the circulatory system: Secondary | ICD-10-CM | POA: Insufficient documentation

## 2018-06-16 DIAGNOSIS — F322 Major depressive disorder, single episode, severe without psychotic features: Secondary | ICD-10-CM | POA: Diagnosis not present

## 2018-06-16 DIAGNOSIS — F329 Major depressive disorder, single episode, unspecified: Secondary | ICD-10-CM

## 2018-06-16 DIAGNOSIS — I10 Essential (primary) hypertension: Secondary | ICD-10-CM | POA: Insufficient documentation

## 2018-06-16 MED ORDER — MELOXICAM 7.5 MG PO TABS: 7.5000 mg | ORAL_TABLET | Freq: Every day | ORAL | 3 refills | Status: DC | PRN

## 2018-06-16 MED ORDER — ATORVASTATIN CALCIUM 10 MG PO TABS: 10.0000 mg | ORAL_TABLET | Freq: Every day | ORAL | 3 refills | Status: DC

## 2018-06-16 NOTE — Patient Instructions (Addendum)
Refills on atorvastatin and meloxicam were sent to the pharmacy We will arrange for consultation with our social worker with regards to your depression Stay on Flovent twice daily 2 puffs Continue to use albuterol as needed 2 puffs every 6 hours for shortness of breath Continue sleep apnea machine nightly We will obtain a lung function test for you this will be scheduled Return 2 months

## 2018-06-16 NOTE — Assessment & Plan Note (Signed)
Moderate persistent asthma with environmental factors improved on inhaled steroid Plan Need to obtain pulmonary function studies Continue Flovent 2 puffs twice daily Continue albuterol as needed Return 2 months

## 2018-06-16 NOTE — Assessment & Plan Note (Signed)
Chronic shoulder pain relieved with as needed meloxicam Plan Refill meloxicam 7.5 mg as needed daily

## 2018-06-16 NOTE — Progress Notes (Signed)
F/u with asthma

## 2018-06-16 NOTE — Assessment & Plan Note (Signed)
Chronic anxiety and depression that appears situational The patient did score high on PHQ 9 with some thoughts of suicidal ideation however the patient does not have any specific plans Patient is caring for her daughter's children and this is created stress for this patient Plan No pharmacologic therapy at this time Referral to licensed clinical social worker for counseling has been made

## 2018-06-16 NOTE — Assessment & Plan Note (Signed)
Significant obstructive sleep apnea stable at this time on CPAP therapy Plan Continue CPAP therapy at night

## 2018-06-16 NOTE — Assessment & Plan Note (Signed)
Current episode of major depression which is severe Review anxiety and depression assessment

## 2018-06-16 NOTE — Progress Notes (Signed)
Subjective:    Patient ID: Cathy [name], female    DOB: 1961/01/26, 57 y.o.   MRN: [id number]  57 y.o.F here for f/u asthma care . Pt with OSA on cpap via [facility]  [id number] has humidity  At the last visit we started Flovent inhaler 2 puffs twice a day and continued as needed albuterol The patient has yet to obtain the pulmonary function study. Recently fell going up steps.       Asthma  She complains of chest tightness, difficulty breathing and shortness of breath. There is no cough, frequent throat clearing, hemoptysis, hoarse voice, sputum production or wheezing. Primary symptoms comments: Dyspnea only up stairs  occ chest tightness at night with cpap. This is a chronic problem. The current episode started more than 1 year ago. Episode frequency: only with going up stairs, smoke exposure  The problem has been gradually improving. Associated symptoms include dyspnea on exertion, PND and sneezing. Pertinent negatives include no chest pain, ear congestion, ear pain, fever, headaches, nasal congestion, postnasal drip, rhinorrhea, sore throat or trouble swallowing. Her symptoms are aggravated by exercise, climbing stairs, exposure to smoke, exposure to fumes, lying down, strenuous activity and URI. Her past medical history is significant for asthma. There is no history of COPD or pneumonia.    Past Medical History:  Diagnosis Date  . Back pain   . Hypertension      Family History  Problem Relation Age of Onset  . Stroke Mother   . Cancer Father   . Heart disease Brother      Social History   Socioeconomic History  . Marital status: Divorced    Spouse name: Not on file  . Number of children: Not on file  . Years of education: Not on file  . Highest education level: Not on file  Occupational History  . Not on file  Social Needs  . Financial resource strain: Not on file  . Food insecurity:    Worry: Not on file    Inability: Not on file  . Transportation needs:    Medical:  Not on file    Non-medical: Not on file  Tobacco Use  . Smoking status: Former Research scientist (life sciences)  . Smokeless tobacco: Never Used  Substance and Sexual Activity  . Alcohol use: No  . Drug use: No  . Sexual activity: Not on file  Lifestyle  . Physical activity:    Days per week: Not on file    Minutes per session: Not on file  . Stress: Not on file  Relationships  . Social connections:    Talks on phone: Not on file    Gets together: Not on file    Attends religious service: Not on file    Active member of club or organization: Not on file    Attends meetings of clubs or organizations: Not on file    Relationship status: Not on file  . Intimate partner violence:    Fear of current or ex partner: Not on file    Emotionally abused: Not on file    Physically abused: Not on file    Forced sexual activity: Not on file  Other Topics Concern  . Not on file  Social History Narrative  . Not on file     Allergies  Allergen Reactions  . Isovue [Iopamidol] Tinitus    Sneezing and congestion post contrast administration, pt will require premeds for future contrasted exams     Outpatient Medications Prior to Visit  Medication Sig Dispense Refill  . albuterol (PROVENTIL HFA;VENTOLIN HFA) 108 (90 Base) MCG/ACT inhaler Inhale 1-2 puffs into the lungs every 6 (six) hours as needed for wheezing or shortness of breath. 1 Inhaler 6  . aspirin EC 81 MG tablet Take 81 mg by mouth 2 (two) times daily.    . benzonatate (TESSALON) 100 MG capsule Take 1 capsule (100 mg total) by mouth every 8 (eight) hours. 21 capsule 0  . cloNIDine (CATAPRES) 0.1 MG tablet Take 1 tablet (0.1 mg total) by mouth 2 (two) times daily. 60 tablet 3  . DULoxetine (CYMBALTA) 30 MG capsule Take 1 capsule (30 mg total) by mouth daily. 30 capsule 3  . fluticasone (FLOVENT HFA) 110 MCG/ACT inhaler Inhale 2 puffs into the lungs 2 (two) times daily. 1 Inhaler 6  . metoprolol tartrate (LOPRESSOR) 25 MG tablet Take 1 tablet (25 mg total)  by mouth 2 (two) times daily. 60 tablet 3  . nitroGLYCERIN (NITROSTAT) 0.4 MG SL tablet Place 1 tablet (0.4 mg total) under the tongue every 5 (five) minutes as needed for chest pain. 25 tablet 11  . Pumpkin Seed-Soy Germ (AZO BLADDER CONTROL/GO-LESS PO) Take 1 tablet by mouth daily.    [name] atorvastatin (LIPITOR) 10 MG tablet Take 1 tablet (10 mg total) by mouth daily. 90 tablet 3  . amoxicillin (AMOXIL) 500 MG capsule Take 1 capsule (500 mg total) by mouth 2 (two) times daily. (Patient not taking: Reported on 06/16/2018) 20 capsule 0  . meloxicam (MOBIC) 7.5 MG tablet Take 1 tablet (7.5 mg total) by mouth daily. (Patient not taking: Reported on 06/16/2018) 30 tablet 3  . methocarbamol (ROBAXIN) 500 MG tablet Take 1 tablet (500 mg total) by mouth 4 (four) times daily. (Patient not taking: Reported on 05/07/2018) 60 tablet 3   Facility-Administered Medications Prior to Visit  Medication Dose Route Frequency Provider Last Rate Last Dose  . betamethasone acetate-betamethasone sodium phosphate (CELESTONE) injection 3 mg  3 mg Intramuscular Once Daylene Katayama M, DPM      . betamethasone acetate-betamethasone sodium phosphate (CELESTONE) injection 3 mg  3 mg Intramuscular Once Daylene Katayama M, DPM      . betamethasone acetate-betamethasone sodium phosphate (CELESTONE) injection 3 mg  3 mg Intramuscular Once Edrick Kins, DPM         Review of Systems  Constitutional: Negative for fever.  HENT: Positive for sneezing. Negative for ear pain, hoarse voice, postnasal drip, rhinorrhea, sore throat and trouble swallowing.   Respiratory: Positive for shortness of breath. Negative for cough, hemoptysis, sputum production and wheezing.   Cardiovascular: Positive for dyspnea on exertion, orthopnea and PND. Negative for chest pain, claudication, leg swelling and syncope.  Gastrointestinal: Negative for abdominal pain and vomiting.  Musculoskeletal: Negative for neck pain.  Skin: Negative for rash.  Neurological:  Negative for headaches.       Objective:   Physical Exam  Vitals:   06/16/18 0839  BP: (!) 153/91  Pulse: (!) 55  Resp: 16  Temp: 98.5 F (36.9 C)  TempSrc: Oral  SpO2: 96%  Weight: 262 lb (118.8 kg)  Height: 5\' 4"  (1.626 m)    Gen: Pleasant, obese, in no distress,  normal affect  ENT: No lesions,  mouth clear,  oropharynx clear, no postnasal drip  Neck: No JVD, no TMG, no carotid bruits  Lungs: No use of accessory muscles, no dullness to percussion, distant breath sounds without wheezes  Cardiovascular: RRR, heart sounds normal, no murmur or gallops, no peripheral  edema  Abdomen: soft and NT, no HSM,  BS normal  Musculoskeletal: No deformities, no cyanosis or clubbing  Neuro: alert, non focal  Skin: Warm, no lesions or rashes  No results found.   Office Visit from 06/16/2018 in [place]  PHQ-9 Total Score  18       CXR 07/2017: IMPRESSION: No active cardiopulmonary disease.  Echo: 05/2017 Study Conclusions  - Left ventricle: The cavity size was normal. Wall thickness was   increased in a pattern of mild LVH. Systolic function was normal.   The estimated ejection fraction was in the range of 60% to 65%.   Doppler parameters are consistent with abnormal left ventricular   relaxation (grade 1 diastolic dysfunction). - Mitral valve: Calcified annulus. - Left atrium: The atrium was mildly dilated.  Cardiac Cath 05/2017 Normal  Ost RCA lesion, 30 %stenosed -- catheter related spasm.  Otherwise Angiographically normal coronary arteries.  The left ventricular systolic function is normal. The left ventricular ejection fraction is 55-65% by visual estimate.  LV end diastolic pressure is normal.  Tortuous Innominate Artery  Sleep Study 10/2017: IMPRESSIONS - Mild obstructive sleep apnea occurred during this study (AHI =  8.1/h). - No significant central sleep apnea occurred during this study  (CAI = 0.0/h). - Mild  oxygen desaturation was noted during this study (Min O2 =  85.0%). Mean 94% - The patient snored with moderate snoring volume. - No cardiac abnormalities were noted during this study. - Clinically significant periodic limb movements did not occur  during sleep. No significant associated arousals.  DIAGNOSIS - Obstructive Sleep Apnea (327.23 [G47.33 ICD-10])  RECOMMENDATIONS - Treatment for mild OSA is directed at symptoms. If conservative  measures are insufficient, consider CPAP or a fitted oral  appliance, based on clinical judgment. - Be careful with alcohol, sedatives and other CNS depressants  that may worsen sleep apnea and disrupt normal sleep  architecture. - Sleep hygiene should be reviewed to assess factors that may  improve sleep quality. - Weight management and regular exercise should be initiated or  continued if appropriate.   Assessment & Plan:  I personally reviewed all images and lab data in the [facility] system as well as any outside material available during this office visit and agree with the  radiology impressions.   Asthma, moderate persistent Moderate persistent asthma with environmental factors improved on inhaled steroid Plan Need to obtain pulmonary function studies Continue Flovent 2 puffs twice daily Continue albuterol as needed Return 2 months  Obstructive sleep apnea Significant obstructive sleep apnea stable at this time on CPAP therapy Plan Continue CPAP therapy at night  Right shoulder pain Chronic shoulder pain relieved with as needed meloxicam Plan Refill meloxicam 7.5 mg as needed daily  Anxiety and depression Chronic anxiety and depression that appears situational The patient did score high on PHQ 9 with some thoughts of suicidal ideation however the patient does not have any specific plans Patient is caring for her daughter's children and this is created stress for this patient Plan No pharmacologic therapy at this time Referral to  licensed clinical social worker for counseling has been made  Current severe episode of major depressive disorder without psychotic features without prior episode ([place]) Current episode of major depression which is severe Review anxiety and depression assessment   Raylin was seen today for follow-up.  Diagnoses and all orders for this visit:  Moderate persistent asthma without complication  Chronic pain of both shoulders -  meloxicam (MOBIC) 7.5 MG tablet; Take 1 tablet (7.5 mg total) by mouth daily as needed for pain.  Current severe episode of major depressive disorder without psychotic features without prior episode ([place]) -     Ambulatory referral to Social Work  Obstructive sleep apnea  Chronic right shoulder pain  Anxiety and depression  Other orders -     atorvastatin (LIPITOR) 10 MG tablet; Take 1 tablet (10 mg total) by mouth daily.    I had an extended discussion with the patient and or family lasting 10 minutes of a 25 minute visit including: Regarding the patient's depression and need for follow-up counseling

## 2018-06-18 ENCOUNTER — Ambulatory Visit (HOSPITAL_COMMUNITY)
Admission: RE | Admit: 2018-06-18 | Discharge: 2018-06-18 | Disposition: A | Discharge status: Home / Self Care | Payer: Medicare Other | Source: Ambulatory Visit | Attending: Critical Care Medicine | Admitting: Critical Care Medicine

## 2018-06-18 DIAGNOSIS — J4540 Moderate persistent asthma, uncomplicated: Secondary | ICD-10-CM | POA: Diagnosis not present

## 2018-06-18 LAB — PULMONARY FUNCTION TEST
DL/VA % PRED: 105 %
DL/VA: 5.06 ml/min/mmHg/L
DLCO UNC % PRED: 86 %
DLCO UNC: 20.98 ml/min/mmHg
FEF 25-75 PRE: 3.35 L/s
FEF 25-75 Post: 4.25 L/sec
FEF2575-%Change-Post: 26 %
FEF2575-%PRED-POST: 187 %
FEF2575-%Pred-Pre: 148 %
FEV1-%CHANGE-POST: 5 %
FEV1-%Pred-Post: 112 %
FEV1-%Pred-Pre: 107 %
FEV1-Post: 2.49 L
FEV1-Pre: 2.37 L
FEV1FVC-%Change-Post: 1 %
FEV1FVC-%PRED-PRE: 108 %
FEV6-%Change-Post: 3 %
FEV6-%PRED-POST: 104 %
FEV6-%Pred-Pre: 100 %
FEV6-POST: 2.82 L
FEV6-Pre: 2.72 L
FEV6FVC-%PRED-POST: 103 %
FEV6FVC-%Pred-Pre: 103 %
FVC-%Change-Post: 3 %
FVC-%PRED-POST: 101 %
FVC-%PRED-PRE: 97 %
FVC-PRE: 2.72 L
FVC-Post: 2.82 L
POST FEV1/FVC RATIO: 88 %
Post FEV6/FVC ratio: 100 %
Pre FEV1/FVC ratio: 87 %
Pre FEV6/FVC Ratio: 100 %
RV % pred: 77 %
RV: 1.47 L
TLC % pred: 86 %
TLC: 4.36 L

## 2018-06-18 MED ORDER — ALBUTEROL SULFATE (2.5 MG/3ML) 0.083% IN NEBU
2.5000 mg | INHALATION_SOLUTION | Freq: Once | RESPIRATORY_TRACT | Status: AC
  Administered 2018-06-18: 2.5 mg via RESPIRATORY_TRACT

## 2018-06-23 DIAGNOSIS — G4733 Obstructive sleep apnea (adult) (pediatric): Secondary | ICD-10-CM | POA: Diagnosis not present

## 2018-06-24 ENCOUNTER — Encounter: Payer: Self-pay | Admitting: Family Medicine

## 2018-06-24 ENCOUNTER — Ambulatory Visit: Payer: Medicare Other | Attending: Family Medicine | Admitting: Family Medicine

## 2018-06-24 VITALS — BP 173/91 | HR 57 | Temp 97.8°F | Ht 64.0 in | Wt 265.0 lb

## 2018-06-24 DIAGNOSIS — Z791 Long term (current) use of non-steroidal anti-inflammatories (NSAID): Secondary | ICD-10-CM | POA: Diagnosis not present

## 2018-06-24 DIAGNOSIS — M25511 Pain in right shoulder: Secondary | ICD-10-CM | POA: Insufficient documentation

## 2018-06-24 DIAGNOSIS — E669 Obesity, unspecified: Secondary | ICD-10-CM | POA: Insufficient documentation

## 2018-06-24 DIAGNOSIS — Z7982 Long term (current) use of aspirin: Secondary | ICD-10-CM | POA: Insufficient documentation

## 2018-06-24 DIAGNOSIS — J4540 Moderate persistent asthma, uncomplicated: Secondary | ICD-10-CM | POA: Diagnosis not present

## 2018-06-24 DIAGNOSIS — G4733 Obstructive sleep apnea (adult) (pediatric): Secondary | ICD-10-CM | POA: Diagnosis not present

## 2018-06-24 DIAGNOSIS — Z79899 Other long term (current) drug therapy: Secondary | ICD-10-CM | POA: Insufficient documentation

## 2018-06-24 DIAGNOSIS — R001 Bradycardia, unspecified: Secondary | ICD-10-CM | POA: Diagnosis not present

## 2018-06-24 DIAGNOSIS — F419 Anxiety disorder, unspecified: Secondary | ICD-10-CM | POA: Insufficient documentation

## 2018-06-24 DIAGNOSIS — R0609 Other forms of dyspnea: Secondary | ICD-10-CM | POA: Diagnosis not present

## 2018-06-24 DIAGNOSIS — I10 Essential (primary) hypertension: Secondary | ICD-10-CM | POA: Diagnosis not present

## 2018-06-24 DIAGNOSIS — Z6841 Body Mass Index (BMI) 40.0 and over, adult: Secondary | ICD-10-CM | POA: Insufficient documentation

## 2018-06-24 DIAGNOSIS — Z888 Allergy status to other drugs, medicaments and biological substances status: Secondary | ICD-10-CM | POA: Diagnosis not present

## 2018-06-24 DIAGNOSIS — F329 Major depressive disorder, single episode, unspecified: Secondary | ICD-10-CM | POA: Insufficient documentation

## 2018-06-24 MED ORDER — DULOXETINE HCL 30 MG PO CPEP: 30.0000 mg | ORAL_CAPSULE | Freq: Every day | ORAL | 3 refills | Status: DC

## 2018-06-24 MED ORDER — AMLODIPINE BESYLATE 5 MG PO TABS: 5.0000 mg | ORAL_TABLET | Freq: Every day | ORAL | 3 refills | Status: DC

## 2018-06-24 MED ORDER — METOPROLOL TARTRATE 25 MG PO TABS: 25.0000 mg | ORAL_TABLET | Freq: Two times a day (BID) | ORAL | 3 refills | Status: DC

## 2018-06-24 MED ORDER — CLONIDINE HCL 0.1 MG PO TABS: 0.1000 mg | ORAL_TABLET | Freq: Two times a day (BID) | ORAL | 3 refills | Status: DC

## 2018-06-24 MED FILL — METOPROLOL TARTRATE 25 MG T: 25 | 30 days supply | Qty: 60 | Fill #0

## 2018-06-24 MED FILL — cloNIDine HCL 0.1 MG TABS: 0.1 | 30 days supply | Qty: 60 | Fill #0

## 2018-06-24 MED FILL — AMLODIPINE BESYLATE 5 MG TA: 5 | 30 days supply | Qty: 30 | Fill #0

## 2018-06-24 MED FILL — DULoxetine HCL 30 MG CPEP: 30 | 30 days supply | Qty: 30 | Fill #0

## 2018-06-24 NOTE — Progress Notes (Signed)
Subjective:  Patient ID: Cathy [name], female    DOB: [birth year]  Age: 57 y.o. MRN: [id number]  CC: Hypertension   HPI [name] is a 57 year old female with a history of hypertension, obstructive sleep apnea, mild, obesity who presents today for a follow-up visit.  She is currently on CPAP for her OSA She was seen by Dr. Joya Gaskins on 06/16/2018 for management of asthma and is doing well on Flovent and her Proventil MDI.  Pulmonary function test from 06/18/2018 was normal pulmonary function study. Her blood pressure is elevated and she endorses being under a lot of stress due to her living situation but has been compliant with her medications.  Also takes Cymbalta for anxiety and depression. Right shoulder hurts on days when she has to babysit her grandkids that she is really active and carries them a lot but meloxicam helps her symptoms. She has no additional concerns today.  Past Medical History:  Diagnosis Date  . Back pain   . Hypertension     Past Surgical History:  Procedure Laterality Date  . BACK SURGERY    . KNEE SURGERY    . LEFT HEART CATH AND CORONARY ANGIOGRAPHY N/A 05/20/2017   Procedure: LEFT HEART CATH AND CORONARY ANGIOGRAPHY;  Surgeon: Leonie Man, MD;  Location: [facility];  Service: Cardiovascular;  Laterality: N/A;    Allergies  Allergen Reactions  . Isovue [Iopamidol] Tinitus    Sneezing and congestion post contrast administration, pt will require premeds for future contrasted exams     Outpatient Medications Prior to Visit  Medication Sig Dispense Refill  . albuterol (PROVENTIL HFA;VENTOLIN HFA) 108 (90 Base) MCG/ACT inhaler Inhale 1-2 puffs into the lungs every 6 (six) hours as needed for wheezing or shortness of breath. 1 Inhaler 6  . aspirin EC 81 MG tablet Take 81 mg by mouth 2 (two) times daily.    [name] atorvastatin (LIPITOR) 10 MG tablet Take 1 tablet (10 mg total) by mouth daily. 90 tablet 3  . benzonatate (TESSALON) 100 MG capsule  Take 1 capsule (100 mg total) by mouth every 8 (eight) hours. 21 capsule 0  . fluticasone (FLOVENT HFA) 110 MCG/ACT inhaler Inhale 2 puffs into the lungs 2 (two) times daily. 1 Inhaler 6  . meloxicam (MOBIC) 7.5 MG tablet Take 1 tablet (7.5 mg total) by mouth daily as needed for pain. 30 tablet 3  . Pumpkin Seed-Soy Germ (AZO BLADDER CONTROL/GO-LESS PO) Take 1 tablet by mouth daily.    . cloNIDine (CATAPRES) 0.1 MG tablet Take 1 tablet (0.1 mg total) by mouth 2 (two) times daily. 60 tablet 3  . DULoxetine (CYMBALTA) 30 MG capsule Take 1 capsule (30 mg total) by mouth daily. 30 capsule 3  . nitroGLYCERIN (NITROSTAT) 0.4 MG SL tablet Place 1 tablet (0.4 mg total) under the tongue every 5 (five) minutes as needed for chest pain. 25 tablet 11  . metoprolol tartrate (LOPRESSOR) 25 MG tablet Take 1 tablet (25 mg total) by mouth 2 (two) times daily. 60 tablet 3   Facility-Administered Medications Prior to Visit  Medication Dose Route Frequency Provider Last Rate Last Dose  . betamethasone acetate-betamethasone sodium phosphate (CELESTONE) injection 3 mg  3 mg Intramuscular Once Daylene Katayama M, DPM      . betamethasone acetate-betamethasone sodium phosphate (CELESTONE) injection 3 mg  3 mg Intramuscular Once Daylene Katayama M, DPM      . betamethasone acetate-betamethasone sodium phosphate (CELESTONE) injection 3 mg  3 mg Intramuscular Once Evans,  Dorathy Daft, DPM        ROS Review of Systems  Constitutional: Negative for activity change, appetite change and fatigue.  HENT: Negative for congestion, sinus pressure and sore throat.   Eyes: Negative for visual disturbance.  Respiratory: Negative for cough, chest tightness, shortness of breath and wheezing.   Cardiovascular: Negative for chest pain and palpitations.  Gastrointestinal: Negative for abdominal distention, abdominal pain and constipation.  Endocrine: Negative for polydipsia.  Genitourinary: Negative for dysuria and frequency.  Musculoskeletal:        See hpi  Skin: Negative for rash.  Neurological: Negative for tremors, light-headedness and numbness.  Hematological: Does not bruise/bleed easily.  Psychiatric/Behavioral: Negative for agitation and behavioral problems.    Objective:  BP (!) 173/91   Pulse (!) 57   Temp 97.8 F (36.6 C) (Oral)   Ht 5\' 4"  (1.626 m)   Wt 265 lb (120.2 kg)   SpO2 99%   BMI 45.49 kg/m   BP/Weight 06/24/2018 06/16/2018 82/04/9370  Systolic BP 696 789 381  Diastolic BP 91 91 86  Wt. (Lbs) 265 262 265  BMI 45.49 44.97 45.49      Physical Exam  Constitutional: She is oriented to person, place, and time. She appears well-developed and well-nourished.  Cardiovascular: Normal rate, normal heart sounds and intact distal pulses.  No murmur heard. Pulmonary/Chest: Effort normal and breath sounds normal. She has no wheezes. She has no rales. She exhibits no tenderness.  Abdominal: Soft. Bowel sounds are normal. She exhibits no distension and no mass. There is no tenderness.  Musculoskeletal: Normal range of motion.  Slight tenderness in right shoulder and full active elevation and abduction but she is able to achieve full range of motion  Neurological: She is alert and oriented to person, place, and time.  Skin: Skin is warm and dry.     Assessment & Plan:   1. Anxiety and depression Controlled - DULoxetine (CYMBALTA) 30 MG capsule; Take 1 capsule (30 mg total) by mouth daily.  Dispense: 30 capsule; Refill: 3  2. Essential hypertension Uncontrolled Amlodipine added to regimen Unable to increase dose of clonidine on metoprolol due to bradycardia and she needs to remain on metoprolol which she was placed on by cardiology - amLODipine (NORVASC) 5 MG tablet; Take 1 tablet (5 mg total) by mouth daily.  Dispense: 30 tablet; Refill: 3 - metoprolol tartrate (LOPRESSOR) 25 MG tablet; Take 1 tablet (25 mg total) by mouth 2 (two) times daily.  Dispense: 60 tablet; Refill: 3 - cloNIDine (CATAPRES) 0.1 MG  tablet; Take 1 tablet (0.1 mg total) by mouth 2 (two) times daily.  Dispense: 60 tablet; Refill: 3  3. Moderate persistent asthma without complication Doing well on Flovent and SABA  4. Obstructive sleep apnea Compliant with CPAP  5.  Obesity Advised to increase physical activity, reduce portion sizes  6. Right shoulder pain Pain is only present due to overexertion when she has her grandkids and this is controlled on meloxicam  Meds ordered this encounter  Medications  . amLODipine (NORVASC) 5 MG tablet    Sig: Take 1 tablet (5 mg total) by mouth daily.    Dispense:  30 tablet    Refill:  3  . DULoxetine (CYMBALTA) 30 MG capsule    Sig: Take 1 capsule (30 mg total) by mouth daily.    Dispense:  30 capsule    Refill:  3  . metoprolol tartrate (LOPRESSOR) 25 MG tablet    Sig: Take 1 tablet (  25 mg total) by mouth 2 (two) times daily.    Dispense:  60 tablet    Refill:  3  . cloNIDine (CATAPRES) 0.1 MG tablet    Sig: Take 1 tablet (0.1 mg total) by mouth 2 (two) times daily.    Dispense:  60 tablet    Refill:  3    Follow-up: Return in about 3 months (around 09/24/2018) for follow up of chronic medical conditions.   Charlott Rakes MD

## 2018-06-24 NOTE — Patient Instructions (Signed)

## 2018-06-30 ENCOUNTER — Ambulatory Visit: Payer: Medicare Other | Admitting: Family Medicine

## 2018-06-30 ENCOUNTER — Telehealth: Payer: Self-pay | Admitting: Licensed Clinical Social Worker

## 2018-06-30 NOTE — Telephone Encounter (Signed)
Call placed to pt. LCSWA introduced self and explained role at [facility]. Pt was informed of consult to address behavioral health and/or psychosocial needs.   Pt scheduled appointment with [facility] for 07/08/18 at [facility]. No additional concerns noted.

## 2018-07-09 ENCOUNTER — Institutional Professional Consult (permissible substitution): Payer: Medicare Other | Admitting: Licensed Clinical Social Worker

## 2018-07-23 DIAGNOSIS — G4733 Obstructive sleep apnea (adult) (pediatric): Secondary | ICD-10-CM | POA: Diagnosis not present

## 2018-07-28 MED FILL — cloNIDine HCL 0.1 MG TABS: 0.1 | 30 days supply | Qty: 60 | Fill #1

## 2018-07-28 MED FILL — METOPROLOL TARTRATE 25 MG T: 25 | 30 days supply | Qty: 60 | Fill #1

## 2018-07-28 MED FILL — DULoxetine HCL 30 MG CPEP: 30 | 30 days supply | Qty: 30 | Fill #1

## 2018-07-28 MED FILL — AMLODIPINE BESYLATE 5 MG TA: 5 | 30 days supply | Qty: 30 | Fill #1

## 2018-08-08 ENCOUNTER — Ambulatory Visit: Payer: Medicare Other | Admitting: Critical Care Medicine

## 2018-08-23 DIAGNOSIS — G4733 Obstructive sleep apnea (adult) (pediatric): Secondary | ICD-10-CM | POA: Diagnosis not present

## 2018-08-28 MED FILL — DULoxetine HCL 30 MG CPEP: 30 | 30 days supply | Qty: 30 | Fill #2

## 2018-08-28 MED FILL — METOPROLOL TARTRATE 25 MG T: 25 | 30 days supply | Qty: 60 | Fill #2

## 2018-08-28 MED FILL — cloNIDine HCL 0.1 MG TABS: 0.1 | 30 days supply | Qty: 60 | Fill #2

## 2018-08-28 MED FILL — AMLODIPINE BESYLATE 5 MG TA: 5 | 30 days supply | Qty: 30 | Fill #2

## 2018-09-24 ENCOUNTER — Encounter: Payer: Self-pay | Admitting: Family Medicine

## 2018-09-24 ENCOUNTER — Ambulatory Visit: Payer: Medicare Other | Attending: Family Medicine | Admitting: Family Medicine

## 2018-09-24 VITALS — BP 130/85 | HR 67 | Temp 98.1°F | Ht 64.0 in | Wt 265.0 lb

## 2018-09-24 DIAGNOSIS — Z1211 Encounter for screening for malignant neoplasm of colon: Secondary | ICD-10-CM

## 2018-09-24 DIAGNOSIS — Z6841 Body Mass Index (BMI) 40.0 and over, adult: Secondary | ICD-10-CM

## 2018-09-24 DIAGNOSIS — F419 Anxiety disorder, unspecified: Secondary | ICD-10-CM

## 2018-09-24 DIAGNOSIS — F32A Depression, unspecified: Secondary | ICD-10-CM

## 2018-09-24 DIAGNOSIS — G8929 Other chronic pain: Secondary | ICD-10-CM

## 2018-09-24 DIAGNOSIS — I10 Essential (primary) hypertension: Secondary | ICD-10-CM | POA: Diagnosis not present

## 2018-09-24 DIAGNOSIS — F329 Major depressive disorder, single episode, unspecified: Secondary | ICD-10-CM

## 2018-09-24 DIAGNOSIS — M25562 Pain in left knee: Secondary | ICD-10-CM

## 2018-09-24 MED ORDER — DULOXETINE HCL 30 MG PO CPEP: 30.0000 mg | ORAL_CAPSULE | Freq: Every day | ORAL | 6 refills | Status: DC

## 2018-09-24 MED ORDER — AMLODIPINE BESYLATE 5 MG PO TABS: 5.0000 mg | ORAL_TABLET | Freq: Every day | ORAL | 6 refills | Status: DC

## 2018-09-24 MED ORDER — MELOXICAM 15 MG PO TABS: 15.0000 mg | ORAL_TABLET | Freq: Every day | ORAL | 1 refills | Status: DC | PRN

## 2018-09-24 MED ORDER — METOPROLOL TARTRATE 25 MG PO TABS: 25.0000 mg | ORAL_TABLET | Freq: Two times a day (BID) | ORAL | 6 refills | Status: DC

## 2018-09-24 MED ORDER — CLONIDINE HCL 0.1 MG PO TABS: 0.1000 mg | ORAL_TABLET | Freq: Two times a day (BID) | ORAL | 6 refills | Status: DC

## 2018-09-24 MED FILL — AMLODIPINE BESYLATE 5 MG TA: 5 | 30 days supply | Qty: 30 | Fill #0

## 2018-09-24 MED FILL — cloNIDine HCL 0.1 MG TABS: 0.1 | 30 days supply | Qty: 60 | Fill #0

## 2018-09-24 MED FILL — DULoxetine HCL 30 MG CPEP: 30 | 30 days supply | Qty: 30 | Fill #0

## 2018-09-24 MED FILL — MELOXICAM 15 MG TABLET: 15 | 30 days supply | Qty: 30 | Fill #0

## 2018-09-24 MED FILL — METOPROLOL TARTRATE 25 MG T: 25 | 30 days supply | Qty: 60 | Fill #0

## 2018-09-24 NOTE — Progress Notes (Signed)
Subjective:  Patient ID: Cathy [name], female    DOB: August 19, 1960  Age: 58 y.o. MRN: [id number]  CC: Hypertension   HPI Cathy [name] is a 58 year old female with a history of hypertension, obstructive sleep apnea, mild, obesity who presents today for a follow-up visit.  She is currently on CPAP for her OSA and reports improvement in sleep. Her Asthma has improved ever since she moved from her old apartment which had a lot of smokers.Denies recent exacerbation. She complains of left anterior knee pain which got to a 10/10 and was uncontrolled on her Mobic however symptoms improved when she took a higher dose of Mobic which she received from a family member.Pain is a 5/10 at this time. Symptoms are exacerbated  By prolonged standing and going up stairs. Doing well on her antihypertensive.  Past Medical History:  Diagnosis Date  . Back pain   . Hypertension     Past Surgical History:  Procedure Laterality Date  . BACK SURGERY    . KNEE SURGERY    . LEFT HEART CATH AND CORONARY ANGIOGRAPHY N/A 05/20/2017   Procedure: LEFT HEART CATH AND CORONARY ANGIOGRAPHY;  Surgeon: Leonie Man, MD;  Location: [facility];  Service: Cardiovascular;  Laterality: N/A;    Family History  Problem Relation Age of Onset  . Stroke Mother   . Cancer Father   . Heart disease Brother     Allergies  Allergen Reactions  . Isovue [Iopamidol] Tinitus    Sneezing and congestion post contrast administration, pt will require premeds for future contrasted exams    Outpatient Medications Prior to Visit  Medication Sig Dispense Refill  . albuterol (PROVENTIL HFA;VENTOLIN HFA) 108 (90 Base) MCG/ACT inhaler Inhale 1-2 puffs into the lungs every 6 (six) hours as needed for wheezing or shortness of breath. 1 Inhaler 6  . aspirin EC 81 MG tablet Take 81 mg by mouth 2 (two) times daily.    . benzonatate (TESSALON) 100 MG capsule Take 1 capsule (100 mg total) by mouth every 8 (eight) hours. 21 capsule 0   . fluticasone (FLOVENT HFA) 110 MCG/ACT inhaler Inhale 2 puffs into the lungs 2 (two) times daily. 1 Inhaler 6  . Pumpkin Seed-Soy Germ (AZO BLADDER CONTROL/GO-LESS PO) Take 1 tablet by mouth daily.    [name] amLODipine (NORVASC) 5 MG tablet Take 1 tablet (5 mg total) by mouth daily. 30 tablet 3  . cloNIDine (CATAPRES) 0.1 MG tablet Take 1 tablet (0.1 mg total) by mouth 2 (two) times daily. 60 tablet 3  . DULoxetine (CYMBALTA) 30 MG capsule Take 1 capsule (30 mg total) by mouth daily. 30 capsule 3  . meloxicam (MOBIC) 7.5 MG tablet Take 1 tablet (7.5 mg total) by mouth daily as needed for pain. 30 tablet 3  . atorvastatin (LIPITOR) 10 MG tablet Take 1 tablet (10 mg total) by mouth daily. 90 tablet 3  . nitroGLYCERIN (NITROSTAT) 0.4 MG SL tablet Place 1 tablet (0.4 mg total) under the tongue every 5 (five) minutes as needed for chest pain. 25 tablet 11  . metoprolol tartrate (LOPRESSOR) 25 MG tablet Take 1 tablet (25 mg total) by mouth 2 (two) times daily. 60 tablet 3   Facility-Administered Medications Prior to Visit  Medication Dose Route Frequency Provider Last Rate Last Dose  . betamethasone acetate-betamethasone sodium phosphate (CELESTONE) injection 3 mg  3 mg Intramuscular Once Daylene Katayama M, DPM      . betamethasone acetate-betamethasone sodium phosphate (CELESTONE) injection 3 mg  3 mg Intramuscular Once Daylene Katayama M, DPM      . betamethasone acetate-betamethasone sodium phosphate (CELESTONE) injection 3 mg  3 mg Intramuscular Once Daylene Katayama M, DPM         ROS Review of Systems General: negative for fever, weight loss, appetite change Eyes: no visual symptoms. ENT: no ear symptoms, no sinus tenderness, no nasal congestion or sore throat. Neck: no pain  Respiratory: no wheezing, shortness of breath, cough Cardiovascular: no chest pain, no dyspnea on exertion, no pedal edema, no orthopnea. Gastrointestinal: no abdominal pain, no diarrhea, no constipation Genito-Urinary: no urinary  frequency, no dysuria, no polyuria. Hematologic: no bruising Endocrine: no cold or heat intolerance Neurological: no headaches, no seizures, no tremors Musculoskeletal: see hpi Skin: no pruritus, no rash. Psychological: no depression, no anxiety,    Objective:  BP 130/85   Pulse 67   Temp 98.1 F (36.7 C) (Oral)   Ht 5\' 4"  (1.626 m)   Wt 265 lb (120.2 kg)   SpO2 98%   BMI 45.49 kg/m   BP/Weight 09/24/2018 06/24/2018 62/83/1517  Systolic BP 616 073 710  Diastolic BP 85 91 91  Wt. (Lbs) 265 265 262  BMI 45.49 45.49 44.97      Physical Exam Constitutional: normal appearing,  Eyes: PERRLA HEENT: Head is atraumatic, normal sinuses, normal oropharynx, normal appearing tonsils and palate, tympanic membrane is normal bilaterally. Neck: normal range of motion, no thyromegaly, no JVD Cardiovascular: normal rate and rhythm, normal heart sounds, no murmurs, rub or gallop, no pedal edema Respiratory: Normal breath sounds, clear to auscultation bilaterally, no wheezes, no rales, no rhonchi Abdomen: soft, not tender to palpation, normal bowel sounds, no enlarged organs Musculoskeletal: nor mal appearance of both knees. Full ROM, slight L knee tenderness on ROM Skin: warm and dry, no lesions. Neurological: alert, oriented x3, cranial nerves I-XII grossly intact , normal motor strength, normal sensation. Psychological: normal mood.   CMP Latest Ref Rng & Units 02/17/2018 10/09/2017 05/17/2017  Glucose 65 - 99 mg/dL 81 85 100(H)  BUN 6 - 24 mg/dL 9 10 11   Creatinine 0.57 - 1.00 mg/dL 0.81 0.86 0.79  Sodium 134 - 144 mmol/L 141 139 142  Potassium 3.5 - 5.2 mmol/L 4.6 4.6 3.8  Chloride 96 - 106 mmol/L 105 104 105  CO2 20 - 29 mmol/L 20 23 25   Calcium 8.7 - 10.2 mg/dL 9.5 9.7 9.5  Total Protein 6.0 - 8.5 g/dL 7.0 7.4 -  Total Bilirubin 0.0 - 1.2 mg/dL 0.2 0.5 -  Alkaline Phos 39 - 117 IU/L 90 83 -  AST 0 - 40 IU/L 16 21 -  ALT 0 - 32 IU/L 17 18 -    Lipid Panel     Component Value  Date/Time   CHOL 173 02/17/2018 1038   TRIG 80 02/17/2018 1038   HDL 78 02/17/2018 1038   CHOLHDL 2.2 02/17/2018 1038   CHOLHDL 2.3 03/07/2009 0902   VLDL 10 03/07/2009 0902   LDLCALC 79 02/17/2018 1038    CBC    Component Value Date/Time   WBC 4.9 05/20/2017 0551   RBC 4.23 05/20/2017 0551   HGB 11.4 (L) 05/20/2017 0551   HCT 38.1 05/20/2017 0551   PLT 266 05/20/2017 0551   MCV 90.1 05/20/2017 0551   MCH 27.0 05/20/2017 0551   MCHC 29.9 (L) 05/20/2017 0551   RDW 16.2 (H) 05/20/2017 0551   LYMPHSABS 2.8 03/31/2015 2055   MONOABS 0.3 03/31/2015 2055   EOSABS 0.0 03/31/2015  2055   BASOSABS 0.0 03/31/2015 2055    No results found for: HGBA1C  Assessment & Plan:   1. Chronic pain of left knee Uncontrolled on current regimen Increased Mobic dose Weight loss will be benegficial - meloxicam (MOBIC) 15 MG tablet; Take 1 tablet (15 mg total) by mouth daily as needed for pain.  Dispense: 30 tablet; Refill: 1  2. Essential hypertension Controlled Counseled on blood pressure goal of less than 130/80, low-sodium, DASH diet, medication compliance, 150 minutes of moderate intensity exercise per week. Discussed medication compliance, adverse effects. - amLODipine (NORVASC) 5 MG tablet; Take 1 tablet (5 mg total) by mouth daily.  Dispense: 30 tablet; Refill: 6 - metoprolol tartrate (LOPRESSOR) 25 MG tablet; Take 1 tablet (25 mg total) by mouth 2 (two) times daily.  Dispense: 60 tablet; Refill: 6 - cloNIDine (CATAPRES) 0.1 MG tablet; Take 1 tablet (0.1 mg total) by mouth 2 (two) times daily.  Dispense: 60 tablet; Refill: 6 - Basic Metabolic Panel  3. Anxiety and depression Controlled - DULoxetine (CYMBALTA) 30 MG capsule; Take 1 capsule (30 mg total) by mouth daily.  Dispense: 30 capsule; Refill: 6  4. Screening for colon cancer - Ambulatory referral to Gastroenterology  5. Obesity, Class III, BMI 40-49.9 (morbid obesity) ([place]) Discussed reducing portion sizes, increasing  physical activity, avoiding late meals   Meds ordered this encounter  Medications  . meloxicam (MOBIC) 15 MG tablet    Sig: Take 1 tablet (15 mg total) by mouth daily as needed for pain.    Dispense:  30 tablet    Refill:  1  . amLODipine (NORVASC) 5 MG tablet    Sig: Take 1 tablet (5 mg total) by mouth daily.    Dispense:  30 tablet    Refill:  6  . metoprolol tartrate (LOPRESSOR) 25 MG tablet    Sig: Take 1 tablet (25 mg total) by mouth 2 (two) times daily.    Dispense:  60 tablet    Refill:  6  . DULoxetine (CYMBALTA) 30 MG capsule    Sig: Take 1 capsule (30 mg total) by mouth daily.    Dispense:  30 capsule    Refill:  6  . cloNIDine (CATAPRES) 0.1 MG tablet    Sig: Take 1 tablet (0.1 mg total) by mouth 2 (two) times daily.    Dispense:  60 tablet    Refill:  6    Follow-up: Return in about 3 months (around 12/23/2018) for follow up of chronic medical conditions.       Charlott Rakes, MD, FAAFP. [facility] [place], [place]   09/24/2018, 2:00 PM

## 2018-09-24 NOTE — Patient Instructions (Signed)

## 2018-09-25 ENCOUNTER — Encounter: Payer: Self-pay | Admitting: Gastroenterology

## 2018-09-25 ENCOUNTER — Encounter: Payer: Self-pay | Admitting: Family Medicine

## 2018-09-25 LAB — BASIC METABOLIC PANEL
BUN/Creatinine Ratio: 14 (ref 9–23)
BUN: 14 mg/dL (ref 6–24)
CO2: 22 mmol/L (ref 20–29)
CREATININE: 1 mg/dL (ref 0.57–1.00)
Calcium: 9.6 mg/dL (ref 8.7–10.2)
Chloride: 103 mmol/L (ref 96–106)
GFR calc Af Amer: 73 mL/min/{1.73_m2} (ref >59)
GFR calc non Af Amer: 64 mL/min/{1.73_m2} (ref >59)
Glucose: 98 mg/dL (ref 65–99)
Potassium: 4.2 mmol/L (ref 3.5–5.2)
Sodium: 144 mmol/L (ref 134–144)

## 2018-10-03 ENCOUNTER — Other Ambulatory Visit: Payer: Self-pay

## 2018-10-03 ENCOUNTER — Ambulatory Visit (AMBULATORY_SURGERY_CENTER): Payer: Self-pay | Admitting: *Deleted

## 2018-10-03 ENCOUNTER — Encounter: Payer: Self-pay | Admitting: Gastroenterology

## 2018-10-03 VITALS — Ht 65.0 in | Wt 266.0 lb

## 2018-10-03 DIAGNOSIS — Z1211 Encounter for screening for malignant neoplasm of colon: Secondary | ICD-10-CM

## 2018-10-03 MED ORDER — SUPREP BOWEL PREP KIT 17.5-3.13-1.6 GM/177ML PO SOLN: 1.0000 | Freq: Once | ORAL | 0 refills | Status: AC

## 2018-10-03 MED FILL — SUPREP BOWEL PREP KIT: 17.5-3.13-1 | 1 days supply | Qty: 354 | Fill #0

## 2018-10-03 NOTE — Progress Notes (Signed)
No egg or soy allergy known to patient  No issues with past sedation with any surgeries  or procedures, no intubation problems  No diet pills per patient No home 02 use per patient  No blood thinners per patient  Pt has issues with constipation 2 day prep ordered No A fib or A flutter  EMMI video sent to pt's e mail

## 2018-10-07 ENCOUNTER — Telehealth: Payer: Self-pay

## 2018-10-07 NOTE — Telephone Encounter (Signed)
-----   Message from Charlott Rakes, MD sent at 09/25/2018  4:12 PM EST ----- Please inform the patient that labs are normal. Thank you.

## 2018-10-07 NOTE — Telephone Encounter (Signed)
Patient was called and informed of lab results. 

## 2018-10-13 ENCOUNTER — Other Ambulatory Visit: Payer: Self-pay

## 2018-10-13 ENCOUNTER — Encounter: Payer: Self-pay | Admitting: Gastroenterology

## 2018-10-13 ENCOUNTER — Ambulatory Visit (AMBULATORY_SURGERY_CENTER): Payer: Medicare Other | Admitting: Gastroenterology

## 2018-10-13 VITALS — BP 109/84 | HR 48 | Temp 97.3°F | Resp 14 | Ht 64.0 in | Wt 265.0 lb

## 2018-10-13 DIAGNOSIS — K635 Polyp of colon: Secondary | ICD-10-CM

## 2018-10-13 DIAGNOSIS — D123 Benign neoplasm of transverse colon: Secondary | ICD-10-CM

## 2018-10-13 DIAGNOSIS — D125 Benign neoplasm of sigmoid colon: Secondary | ICD-10-CM | POA: Diagnosis not present

## 2018-10-13 DIAGNOSIS — D122 Benign neoplasm of ascending colon: Secondary | ICD-10-CM | POA: Diagnosis not present

## 2018-10-13 DIAGNOSIS — Z1211 Encounter for screening for malignant neoplasm of colon: Secondary | ICD-10-CM

## 2018-10-13 MED ORDER — SODIUM CHLORIDE 0.9 % IV SOLN: 500.0000 mL | Freq: Once | INTRAVENOUS | Status: DC

## 2018-10-13 NOTE — Op Note (Signed)
[facility] Patient Name: Cathy [name] Procedure Date: 10/13/2018 11:09 AM MRN: [id number] Endoscopist: Mauri Pole , MD Age: 58 Referring MD:  Date of Birth: 10-Jun-1961 Gender: Female Account #: [id number] Procedure:                Colonoscopy Indications:              Screening for colorectal malignant neoplasm Medicines:                Monitored Anesthesia Care Procedure:                Pre-Anesthesia Assessment:                           - Prior to the procedure, a History and Physical                            was performed, and patient medications and                            allergies were reviewed. The patient's tolerance of                            previous anesthesia was also reviewed. The risks                            and benefits of the procedure and the sedation                            options and risks were discussed with the patient.                            All questions were answered, and informed consent                            was obtained. Prior Anticoagulants: The patient has                            taken no previous anticoagulant or antiplatelet                            agents. ASA Grade Assessment: III - A patient with                            severe systemic disease. After reviewing the risks                            and benefits, the patient was deemed in                            satisfactory condition to undergo the procedure.                           After obtaining informed consent, the colonoscope  was passed under direct vision. Throughout the                            procedure, the patient's blood pressure, pulse, and                            oxygen saturations were monitored continuously. The                            Model PCF-H190DL [id number]) scope was introduced                            through the anus and advanced to the the cecum,   identified by appendiceal orifice and ileocecal                            valve. The colonoscopy was performed without                            difficulty. The patient tolerated the procedure                            well. The quality of the bowel preparation was                            good. The ileocecal valve, appendiceal orifice, and                            rectum were photographed. Scope In: 11:16:51 AM Scope Out: 11:36:03 AM Scope Withdrawal Time: 0 hours 16 minutes 42 seconds  Total Procedure Duration: 0 hours 19 minutes 12 seconds  Findings:                 The perianal and digital rectal examinations were                            normal.                           A 1 mm polyp was found in the ascending colon. The                            polyp was sessile. The polyp was removed with a                            cold biopsy forceps. Resection and retrieval were                            complete.                           Three sessile polyps were found in the transverse                            colon. The polyps were 4 to 9 mm  in size. These                            polyps were removed with a cold snare. Resection                            and retrieval were complete.                           A 12 mm polyp was found in the sigmoid colon. The                            polyp was pedunculated. The polyp was removed with                            a hot snare. Resection and retrieval were complete.                           Multiple small and large-mouthed diverticula were                            found in the sigmoid colon, descending colon,                            transverse colon and ascending colon.                           Non-bleeding internal hemorrhoids were found during                            retroflexion. The hemorrhoids were small. Complications:            No immediate complications. Estimated Blood Loss:     Estimated blood loss was  minimal. Impression:               - One 1 mm polyp in the ascending colon, removed                            with a cold biopsy forceps. Resected and retrieved.                           - Three 4 to 9 mm polyps in the transverse colon,                            removed with a cold snare. Resected and retrieved.                           - One 12 mm polyp in the sigmoid colon, removed                            with a hot snare. Resected and retrieved.                           - Moderate diverticulosis in the sigmoid  colon, in                            the descending colon, in the transverse colon and                            in the ascending colon.                           - Non-bleeding internal hemorrhoids. Recommendation:           - Patient has a contact number available for                            emergencies. The signs and symptoms of potential                            delayed complications were discussed with the                            patient. Return to normal activities tomorrow.                            Written discharge instructions were provided to the                            patient.                           - Resume previous diet.                           - Continue present medications.                           - Await pathology results.                           - Repeat colonoscopy in 3 years for surveillance                            based on pathology results. Mauri Pole, MD 10/13/2018 11:45:35 AM This report has been signed electronically.

## 2018-10-13 NOTE — Progress Notes (Signed)
Called to room to assist during endoscopic procedure.  Patient ID and intended procedure confirmed with present staff. Received instructions for my participation in the procedure from the performing physician.  

## 2018-10-13 NOTE — Progress Notes (Signed)
A and O x3. Report to RN. Tolerated MAC anesthesia well.

## 2018-10-13 NOTE — Progress Notes (Signed)
Pt's states no medical or surgical changes since previsit or office visit. 

## 2018-10-13 NOTE — Patient Instructions (Signed)
Handouts Provided:  Polyps  YOU HAD AN ENDOSCOPIC PROCEDURE TODAY AT THE [facility]:   Refer to the procedure report that was given to you for any specific questions about what was found during the examination.  If the procedure report does not answer your questions, please call your gastroenterologist to clarify.  If you requested that your care partner not be given the details of your procedure findings, then the procedure report has been included in a sealed envelope for you to review at your convenience later.  YOU SHOULD EXPECT: Some feelings of bloating in the abdomen. Passage of more gas than usual.  Walking can help get rid of the air that was put into your GI tract during the procedure and reduce the bloating. If you had a lower endoscopy (such as a colonoscopy or flexible sigmoidoscopy) you may notice spotting of blood in your stool or on the toilet paper. If you underwent a bowel prep for your procedure, you may not have a normal bowel movement for a few days.  Please Note:  You might notice some irritation and congestion in your nose or some drainage.  This is from the oxygen used during your procedure.  There is no need for concern and it should clear up in a day or so.  SYMPTOMS TO REPORT IMMEDIATELY:   Following lower endoscopy (colonoscopy or flexible sigmoidoscopy):  Excessive amounts of blood in the stool  Significant tenderness or worsening of abdominal pains  Swelling of the abdomen that is new, acute  Fever of 100F or higher  For urgent or emergent issues, a gastroenterologist can be reached at any hour by calling [phone].   DIET:  We do recommend a small meal at first, but then you may proceed to your regular diet.  Drink plenty of fluids but you should avoid alcoholic beverages for 24 hours.  ACTIVITY:  You should plan to take it easy for the rest of today and you should NOT DRIVE or use heavy machinery until tomorrow (because of the sedation  medicines used during the test).    FOLLOW UP: Our staff will call the number listed on your records the next business day following your procedure to check on you and address any questions or concerns that you may have regarding the information given to you following your procedure. If we do not reach you, we will leave a message.  However, if you are feeling well and you are not experiencing any problems, there is no need to return our call.  We will assume that you have returned to your regular daily activities without incident.  If any biopsies were taken you will be contacted by phone or by letter within the next 1-3 weeks.  Please call us at [phone] if you have not heard about the biopsies in 3 weeks.    SIGNATURES/CONFIDENTIALITY: You and/or your care partner have signed paperwork which will be entered into your electronic medical record.  These signatures attest to the fact that that the information above on your After Visit Summary has been reviewed and is understood.  Full responsibility of the confidentiality of this discharge information lies with you and/or your care-partner.  

## 2018-10-14 ENCOUNTER — Telehealth: Payer: Self-pay | Admitting: *Deleted

## 2018-10-14 NOTE — Telephone Encounter (Signed)
  Follow up Call-  Call back number 10/13/2018  Post procedure Call Back phone  # [phone]  Permission to leave phone message Yes  Some recent data might be hidden     Patient questions:  Do you have a fever, pain , or abdominal swelling? No. Pain Score  0 *  Have you tolerated food without any problems? Yes.    Have you been able to return to your normal activities? Yes.    Do you have any questions about your discharge instructions: Diet   No. Medications  No. Follow up visit  No.  Do you have questions or concerns about your Care? No.  Actions: * If pain score is 4 or above: No action needed, pain <4.

## 2018-10-20 ENCOUNTER — Encounter: Payer: Self-pay | Admitting: Gastroenterology

## 2018-10-22 MED FILL — cloNIDine HCL 0.1 MG TABS: 0.1 | 90 days supply | Qty: 180 | Fill #1

## 2018-10-22 MED FILL — METOPROLOL TARTRATE 25 MG T: 25 | 90 days supply | Qty: 180 | Fill #1

## 2018-10-22 MED FILL — AMLODIPINE BESYLATE 5 MG TA: 5 | 90 days supply | Qty: 90 | Fill #1

## 2019-01-05 ENCOUNTER — Ambulatory Visit: Payer: Medicare Other | Attending: Family Medicine | Admitting: Family Medicine

## 2019-01-05 ENCOUNTER — Encounter: Payer: Self-pay | Admitting: Family Medicine

## 2019-01-05 ENCOUNTER — Other Ambulatory Visit: Payer: Self-pay

## 2019-01-05 VITALS — BP 118/78 | HR 57 | Temp 97.8°F | Ht 64.0 in | Wt 277.2 lb

## 2019-01-05 DIAGNOSIS — G4733 Obstructive sleep apnea (adult) (pediatric): Secondary | ICD-10-CM

## 2019-01-05 DIAGNOSIS — I10 Essential (primary) hypertension: Secondary | ICD-10-CM

## 2019-01-05 DIAGNOSIS — F329 Major depressive disorder, single episode, unspecified: Secondary | ICD-10-CM

## 2019-01-05 DIAGNOSIS — R0609 Other forms of dyspnea: Secondary | ICD-10-CM

## 2019-01-05 DIAGNOSIS — M25562 Pain in left knee: Secondary | ICD-10-CM

## 2019-01-05 DIAGNOSIS — M25511 Pain in right shoulder: Secondary | ICD-10-CM | POA: Diagnosis not present

## 2019-01-05 DIAGNOSIS — F419 Anxiety disorder, unspecified: Secondary | ICD-10-CM

## 2019-01-05 DIAGNOSIS — G8929 Other chronic pain: Secondary | ICD-10-CM

## 2019-01-05 MED ORDER — DULOXETINE HCL 30 MG PO CPEP: 30.0000 mg | ORAL_CAPSULE | Freq: Every day | ORAL | 1 refills | Status: DC

## 2019-01-05 MED ORDER — ATORVASTATIN CALCIUM 10 MG PO TABS: 10.0000 mg | ORAL_TABLET | Freq: Every day | ORAL | 1 refills | Status: DC

## 2019-01-05 MED ORDER — METOPROLOL TARTRATE 25 MG PO TABS: 25.0000 mg | ORAL_TABLET | Freq: Two times a day (BID) | ORAL | 1 refills | Status: DC

## 2019-01-05 MED ORDER — FLUTICASONE PROPIONATE HFA 110 MCG/ACT IN AERO: 2.0000 | INHALATION_SPRAY | Freq: Two times a day (BID) | RESPIRATORY_TRACT | 6 refills | Status: DC

## 2019-01-05 MED ORDER — AMLODIPINE BESYLATE 5 MG PO TABS: 5.0000 mg | ORAL_TABLET | Freq: Every day | ORAL | 1 refills | Status: DC

## 2019-01-05 MED ORDER — ALBUTEROL SULFATE HFA 108 (90 BASE) MCG/ACT IN AERS: 1.0000 | INHALATION_SPRAY | Freq: Four times a day (QID) | RESPIRATORY_TRACT | 6 refills | Status: DC | PRN

## 2019-01-05 MED ORDER — MELOXICAM 15 MG PO TABS: 15.0000 mg | ORAL_TABLET | Freq: Every day | ORAL | 3 refills | Status: DC | PRN

## 2019-01-05 MED FILL — MELOXICAM 15 MG TABLET: 15 | 30 days supply | Qty: 30 | Fill #0

## 2019-01-05 MED FILL — ATORVASTATIN 10 MG TABLET: 10 | 30 days supply | Qty: 30 | Fill #0

## 2019-01-05 MED FILL — AMLODIPINE BESYLATE 5 MG TA: 5 | 30 days supply | Qty: 30 | Fill #0

## 2019-01-05 MED FILL — FLOVENT HFA 110 MCG INHALER: 110 | 30 days supply | Qty: 12 | Fill #0

## 2019-01-05 MED FILL — METOPROLOL TARTRATE 25 MG T: 25 | 30 days supply | Qty: 60 | Fill #0

## 2019-01-05 MED FILL — PROAIR HFA 90 MCG INHALER: 108 (90 BAS | 25 days supply | Qty: 9 | Fill #0

## 2019-01-05 MED FILL — DULoxetine HCL 30 MG CPEP: 30 | 30 days supply | Qty: 30 | Fill #0

## 2019-01-05 NOTE — Progress Notes (Signed)
Subjective:  Patient ID: Cathy [name], female    DOB: 1961-01-12  Age: 58 y.o. MRN: [id number]  CC: Hypertension   HPI [name] is a 58 year old female with a history of hypertension, obstructive sleep apnea, obesity who presents today for a follow-up visit.  She is currently on CPAP for her OSA and reports improvement in sleep.  She is needing a refill of her Proventil but has been compliant with Flonase.  Her blood pressure is controlled and she attributes this to her son being released from jail in [place]. She complains of right shoulder pain ever since she ran out of meloxicam.  She does have restricted range of motion more with posterior shoulder movement and pain is worse at night, described as severe with no radiation.  She denies tingling or numbness in her hand.  Of note she is right-handed. Anxiety and depression are controlled she denies suicidal ideations or intent and endorses compliance with her medications. She denies chest pain, dyspnea, pedal edema.  She has no additional concerns at this time.  Past Medical History:  Diagnosis Date  . Arthritis   . Back pain   . Depression   . Hyperlipidemia   . Hypertension   . Sleep apnea    cpap    Past Surgical History:  Procedure Laterality Date  . BACK SURGERY    . [place]   states constipated since then  . KNEE SURGERY    . LEFT HEART CATH AND CORONARY ANGIOGRAPHY N/A 05/20/2017   Procedure: LEFT HEART CATH AND CORONARY ANGIOGRAPHY;  Surgeon: Leonie Man, MD;  Location: [facility];  Service: Cardiovascular;  Laterality: N/A;    Family History  Problem Relation Age of Onset  . Stroke Mother   . Cancer Father   . Colon cancer Father   . Heart disease Brother   . Colon polyps Neg Hx   . Esophageal cancer Neg Hx   . Rectal cancer Neg Hx   . Stomach cancer Neg Hx     Allergies  Allergen Reactions  . Isovue [Iopamidol] Tinitus    Sneezing and congestion post  contrast administration, pt will require premeds for future contrasted exams    Outpatient Medications Prior to Visit  Medication Sig Dispense Refill  . aspirin EC 81 MG tablet Take 81 mg by mouth 2 (two) times daily.    . benzonatate (TESSALON) 100 MG capsule Take 1 capsule (100 mg total) by mouth every 8 (eight) hours. 21 capsule 0  . cloNIDine (CATAPRES) 0.1 MG tablet Take 1 tablet (0.1 mg total) by mouth 2 (two) times daily. 60 tablet 6  . Pumpkin Seed-Soy Germ (AZO BLADDER CONTROL/GO-LESS PO) Take 1 tablet by mouth daily.    [name] albuterol (PROVENTIL HFA;VENTOLIN HFA) 108 (90 Base) MCG/ACT inhaler Inhale 1-2 puffs into the lungs every 6 (six) hours as needed for wheezing or shortness of breath. 1 Inhaler 6  . amLODipine (NORVASC) 5 MG tablet Take 1 tablet (5 mg total) by mouth daily. 30 tablet 6  . DULoxetine (CYMBALTA) 30 MG capsule Take 1 capsule (30 mg total) by mouth daily. 30 capsule 6  . fluticasone (FLOVENT HFA) 110 MCG/ACT inhaler Inhale 2 puffs into the lungs 2 (two) times daily. 1 Inhaler 6  . meloxicam (MOBIC) 15 MG tablet Take 1 tablet (15 mg total) by mouth daily as needed for pain. 30 tablet 1  . nitroGLYCERIN (NITROSTAT) 0.4 MG SL tablet Place 1 tablet (0.4 mg total)  under the tongue every 5 (five) minutes as needed for chest pain. 25 tablet 11  . atorvastatin (LIPITOR) 10 MG tablet Take 1 tablet (10 mg total) by mouth daily. 90 tablet 3  . metoprolol tartrate (LOPRESSOR) 25 MG tablet Take 1 tablet (25 mg total) by mouth 2 (two) times daily. 60 tablet 6   No facility-administered medications prior to visit.      ROS Review of Systems  General: negative for fever, weight loss, appetite change Eyes: no visual symptoms. ENT: no ear symptoms, no sinus tenderness, no nasal congestion or sore throat. Neck: no pain  Respiratory: no wheezing, shortness of breath, cough Cardiovascular: no chest pain, no dyspnea on exertion, no pedal edema, no orthopnea. Gastrointestinal: no  abdominal pain, no diarrhea, no constipation Genito-Urinary: no urinary frequency, no dysuria, no polyuria. Hematologic: no bruising Endocrine: no cold or heat intolerance Neurological: no headaches, no seizures, no tremors Musculoskeletal: see HPI Skin: no pruritus, no rash. Psychological: no depression, no anxiety,    Objective:  BP 118/78   Pulse (!) 57   Temp 97.8 F (36.6 C) (Oral)   Ht 5\' 4"  (1.626 m)   Wt 277 lb 3.2 oz (125.7 kg)   SpO2 99%   BMI 47.58 kg/m   BP/Weight 01/05/2019 10/13/2018 02/13/6268  Systolic BP 485 462 -  Diastolic BP 78 84 -  Wt. (Lbs) 277.2 265 266  BMI 47.58 45.49 44.26      Physical Exam Constitutional:      Appearance: She is well-developed. She is obese.  Cardiovascular:     Rate and Rhythm: Normal rate.     Heart sounds: Normal heart sounds. No murmur.  Pulmonary:     Effort: Pulmonary effort is normal.     Breath sounds: Normal breath sounds. No wheezing or rales.  Chest:     Chest wall: No tenderness.  Abdominal:     General: Bowel sounds are normal. There is no distension.     Palpations: Abdomen is soft. There is no mass.     Tenderness: There is no abdominal tenderness.  Musculoskeletal:     Comments: Normal appearance of right shoulder Forward abduction restricted to 100 degrees Good handgrip  Neurological:     Mental Status: She is alert and oriented to person, place, and time.     CMP Latest Ref Rng & Units 09/24/2018 02/17/2018 10/09/2017  Glucose 65 - 99 mg/dL 98 81 85  BUN 6 - 24 mg/dL 14 9 10   Creatinine 0.57 - 1.00 mg/dL 1.00 0.81 0.86  Sodium 134 - 144 mmol/L 144 141 139  Potassium 3.5 - 5.2 mmol/L 4.2 4.6 4.6  Chloride 96 - 106 mmol/L 103 105 104  CO2 20 - 29 mmol/L 22 20 23   Calcium 8.7 - 10.2 mg/dL 9.6 9.5 9.7  Total Protein 6.0 - 8.5 g/dL - 7.0 7.4  Total Bilirubin 0.0 - 1.2 mg/dL - 0.2 0.5  Alkaline Phos 39 - 117 IU/L - 90 83  AST 0 - 40 IU/L - 16 21  ALT 0 - 32 IU/L - 17 18    Lipid Panel      Component Value Date/Time   CHOL 173 02/17/2018 1038   TRIG 80 02/17/2018 1038   HDL 78 02/17/2018 1038   CHOLHDL 2.2 02/17/2018 1038   CHOLHDL 2.3 03/07/2009 0902   VLDL 10 03/07/2009 0902   LDLCALC 79 02/17/2018 1038    CBC    Component Value Date/Time   WBC 4.9 05/20/2017 0551  RBC 4.23 05/20/2017 0551   HGB 11.4 (L) 05/20/2017 0551   HCT 38.1 05/20/2017 0551   PLT 266 05/20/2017 0551   MCV 90.1 05/20/2017 0551   MCH 27.0 05/20/2017 0551   MCHC 29.9 (L) 05/20/2017 0551   RDW 16.2 (H) 05/20/2017 0551   LYMPHSABS 2.8 03/31/2015 2055   MONOABS 0.3 03/31/2015 2055   EOSABS 0.0 03/31/2015 2055   BASOSABS 0.0 03/31/2015 2055    No results found for: HGBA1C  Assessment & Plan:   1. Dyspnea on exertion Stable - albuterol (VENTOLIN HFA) 108 (90 Base) MCG/ACT inhaler; Inhale 1-2 puffs into the lungs every 6 (six) hours as needed for wheezing or shortness of breath.  Dispense: 1 Inhaler; Refill: 6 - fluticasone (FLOVENT HFA) 110 MCG/ACT inhaler; Inhale 2 puffs into the lungs 2 (two) times daily.  Dispense: 1 Inhaler; Refill: 6  2. Chronic pain of left knee Stable - meloxicam (MOBIC) 15 MG tablet; Take 1 tablet (15 mg total) by mouth daily as needed for pain.  Dispense: 30 tablet; Refill: 3  3. Essential hypertension Controlled Counseled on blood pressure goal of less than 130/80, low-sodium, DASH diet, medication compliance, 150 minutes of moderate intensity exercise per week. Discussed medication compliance, adverse effects. - amLODipine (NORVASC) 5 MG tablet; Take 1 tablet (5 mg total) by mouth daily.  Dispense: 90 tablet; Refill: 1 - metoprolol tartrate (LOPRESSOR) 25 MG tablet; Take 1 tablet (25 mg total) by mouth 2 (two) times daily.  Dispense: 90 tablet; Refill: 1  4. Anxiety and depression Stable - DULoxetine (CYMBALTA) 30 MG capsule; Take 1 capsule (30 mg total) by mouth daily.  Dispense: 90 capsule; Refill: 1  5. Chronic right shoulder pain Uncontrolled due to  running out of meloxicam which I have refilled She is not open to cortisone injections Tried physical therapy in the past with no much improvement I have discussed exercises to perform at home   6. Obstructive sleep apnea Doing well on her C pap   Meds ordered this encounter  Medications  . albuterol (VENTOLIN HFA) 108 (90 Base) MCG/ACT inhaler    Sig: Inhale 1-2 puffs into the lungs every 6 (six) hours as needed for wheezing or shortness of breath.    Dispense:  1 Inhaler    Refill:  6  . meloxicam (MOBIC) 15 MG tablet    Sig: Take 1 tablet (15 mg total) by mouth daily as needed for pain.    Dispense:  30 tablet    Refill:  3  . amLODipine (NORVASC) 5 MG tablet    Sig: Take 1 tablet (5 mg total) by mouth daily.    Dispense:  90 tablet    Refill:  1  . atorvastatin (LIPITOR) 10 MG tablet    Sig: Take 1 tablet (10 mg total) by mouth daily.    Dispense:  90 tablet    Refill:  1  . DULoxetine (CYMBALTA) 30 MG capsule    Sig: Take 1 capsule (30 mg total) by mouth daily.    Dispense:  90 capsule    Refill:  1  . fluticasone (FLOVENT HFA) 110 MCG/ACT inhaler    Sig: Inhale 2 puffs into the lungs 2 (two) times daily.    Dispense:  1 Inhaler    Refill:  6  . metoprolol tartrate (LOPRESSOR) 25 MG tablet    Sig: Take 1 tablet (25 mg total) by mouth 2 (two) times daily.    Dispense:  90 tablet    Refill:  1    Follow-up: Return in about 3 months (around 04/07/2019) for medical conditions.       Charlott Rakes, MD, FAAFP. [facility] and [place], [place]   01/05/2019, 12:06 PM

## 2019-01-05 NOTE — Patient Instructions (Signed)
Shoulder Pain Many things can cause shoulder pain, including:  An injury.  Moving the shoulder in the same way again and again (overuse).  Joint pain (arthritis). Pain can come from:  Swelling and irritation (inflammation) of any part of the shoulder.  An injury to the shoulder joint.  An injury to: ? Tissues that connect muscle to bone (tendons). ? Tissues that connect bones to each other (ligaments). ? Bones. Follow these instructions at home: Watch for changes in your symptoms. Let your doctor know about them. Follow these instructions to help with your pain. If you have a sling:  Wear the sling as told by your doctor. Remove it only as told by your doctor.  Loosen the sling if your fingers: ? Tingle. ? Become numb. ? Turn cold and blue.  Keep the sling clean.  If the sling is not waterproof: ? Do not let it get wet. ? Take the sling off when you shower or bathe. Managing pain, stiffness, and swelling   If told, put ice on the painful area: ? Put ice in a plastic bag. ? Place a towel between your skin and the bag. ? Leave the ice on for 20 minutes, 2-3 times a day. Stop putting ice on if it does not help with the pain.  Squeeze a soft ball or a foam pad as much as possible. This prevents swelling in the shoulder. It also helps to strengthen the arm. General instructions  Take over-the-counter and prescription medicines only as told by your doctor.  Keep all follow-up visits as told by your doctor. This is important. Contact a doctor if:  Your pain gets worse.  Medicine does not help your pain.  You have new pain in your arm, hand, or fingers. Get help right away if:  Your arm, hand, or fingers: ? Tingle. ? Are numb. ? Are swollen. ? Are painful. ? Turn white or blue. Summary  Shoulder pain can be caused by many things. These include injury, moving the shoulder in the same away again and again, and joint pain.  Watch for changes in your symptoms.  Let your doctor know about them.  This condition may be treated with a sling, ice, and pain medicine.  Contact your doctor if the pain gets worse or you have new pain. Get help right away if your arm, hand, or fingers tingle or get numb, swollen, or painful.  Keep all follow-up visits as told by your doctor. This is important. This information is not intended to replace advice given to you by your health care provider. Make sure you discuss any questions you have with your health care provider. Document Released: 01/09/2008 Document Revised: 02/04/2018 Document Reviewed: 02/04/2018 Elsevier Interactive Patient Education  2019 Elsevier Inc.  

## 2019-02-19 ENCOUNTER — Telehealth: Payer: Self-pay | Admitting: Family Medicine

## 2019-02-19 MED FILL — METOPROLOL TARTRATE 25 MG T: 25 | 30 days supply | Qty: 60 | Fill #1

## 2019-02-19 MED FILL — AMLODIPINE BESYLATE 5 MG TA: 5 | 30 days supply | Qty: 30 | Fill #1

## 2019-02-19 MED FILL — cloNIDine HCL 0.1 MG TABS: 0.1 | 30 days supply | Qty: 60 | Fill #2

## 2019-03-04 DIAGNOSIS — G4733 Obstructive sleep apnea (adult) (pediatric): Secondary | ICD-10-CM | POA: Diagnosis not present

## 2019-03-25 ENCOUNTER — Other Ambulatory Visit: Payer: Self-pay | Admitting: Family Medicine

## 2019-03-25 DIAGNOSIS — R05 Cough: Secondary | ICD-10-CM

## 2019-03-25 DIAGNOSIS — R059 Cough, unspecified: Secondary | ICD-10-CM

## 2019-03-25 MED FILL — cloNIDine HCL 0.1 MG TABS: 0.1 | 30 days supply | Qty: 60 | Fill #3

## 2019-03-25 MED FILL — METOPROLOL TARTRATE 25 MG T: 25 | 30 days supply | Qty: 60 | Fill #2

## 2019-03-25 MED FILL — DULoxetine HCL 30 MG CPEP: 30 | 30 days supply | Qty: 30 | Fill #1

## 2019-03-25 MED FILL — AMLODIPINE BESYLATE 5 MG TA: 5 | 30 days supply | Qty: 30 | Fill #2

## 2019-03-26 MED FILL — BENZONATATE 100 MG CAPS: 100 | 7 days supply | Qty: 21 | Fill #0 | Status: TO

## 2019-04-16 DIAGNOSIS — Z1159 Encounter for screening for other viral diseases: Secondary | ICD-10-CM | POA: Diagnosis not present

## 2019-05-14 ENCOUNTER — Telehealth: Payer: Self-pay | Admitting: Family Medicine

## 2019-05-18 ENCOUNTER — Other Ambulatory Visit: Payer: Self-pay

## 2019-05-18 DIAGNOSIS — I10 Essential (primary) hypertension: Secondary | ICD-10-CM

## 2019-05-18 MED ORDER — METOPROLOL TARTRATE 25 MG PO TABS: 25.0000 mg | ORAL_TABLET | Freq: Two times a day (BID) | ORAL | 1 refills | Status: DC

## 2019-05-18 MED ORDER — AMLODIPINE BESYLATE 5 MG PO TABS: 5.0000 mg | ORAL_TABLET | Freq: Every day | ORAL | 1 refills | Status: DC

## 2019-05-18 NOTE — Telephone Encounter (Signed)
Medication was refilled and sent to pharmacy with a 90 supply and 1 refill.

## 2019-06-02 DIAGNOSIS — G4733 Obstructive sleep apnea (adult) (pediatric): Secondary | ICD-10-CM | POA: Diagnosis not present

## 2019-06-02 NOTE — Telephone Encounter (Signed)
1 

## 2019-06-17 ENCOUNTER — Other Ambulatory Visit: Payer: Self-pay | Admitting: Pharmacist

## 2019-06-17 DIAGNOSIS — I10 Essential (primary) hypertension: Secondary | ICD-10-CM

## 2019-06-17 MED ORDER — CLONIDINE HCL 0.1 MG PO TABS: 0.1000 mg | ORAL_TABLET | Freq: Two times a day (BID) | ORAL | 0 refills | Status: DC

## 2019-06-30 ENCOUNTER — Other Ambulatory Visit: Payer: Self-pay

## 2019-06-30 ENCOUNTER — Encounter: Payer: Self-pay | Admitting: Family Medicine

## 2019-06-30 ENCOUNTER — Ambulatory Visit: Payer: Medicare Other | Attending: Family Medicine | Admitting: Family Medicine

## 2019-06-30 DIAGNOSIS — F419 Anxiety disorder, unspecified: Secondary | ICD-10-CM

## 2019-06-30 DIAGNOSIS — M25562 Pain in left knee: Secondary | ICD-10-CM | POA: Diagnosis not present

## 2019-06-30 DIAGNOSIS — M62838 Other muscle spasm: Secondary | ICD-10-CM

## 2019-06-30 DIAGNOSIS — I10 Essential (primary) hypertension: Secondary | ICD-10-CM | POA: Diagnosis not present

## 2019-06-30 DIAGNOSIS — G8929 Other chronic pain: Secondary | ICD-10-CM

## 2019-06-30 DIAGNOSIS — F329 Major depressive disorder, single episode, unspecified: Secondary | ICD-10-CM

## 2019-06-30 MED ORDER — DULOXETINE HCL 30 MG PO CPEP: 30.0000 mg | ORAL_CAPSULE | Freq: Every day | ORAL | 1 refills | Status: DC

## 2019-06-30 MED ORDER — CLONIDINE HCL 0.1 MG PO TABS: 0.1000 mg | ORAL_TABLET | Freq: Two times a day (BID) | ORAL | 1 refills | Status: DC

## 2019-06-30 MED ORDER — CYCLOBENZAPRINE HCL 10 MG PO TABS: 10.0000 mg | ORAL_TABLET | Freq: Two times a day (BID) | ORAL | 1 refills | Status: DC | PRN

## 2019-06-30 MED ORDER — ATORVASTATIN CALCIUM 10 MG PO TABS: 10.0000 mg | ORAL_TABLET | Freq: Every day | ORAL | 1 refills | Status: DC

## 2019-06-30 MED ORDER — MELOXICAM 15 MG PO TABS: 15.0000 mg | ORAL_TABLET | Freq: Every day | ORAL | 3 refills | Status: DC | PRN

## 2019-06-30 NOTE — Progress Notes (Signed)
Patient verified DOB Patient has taken medication and eaten today. Patient fell down the steps 1 week ago and has pain in the right side. Patient states grand baby pushed her leg.

## 2019-06-30 NOTE — Progress Notes (Signed)
Virtual Visit via Telephone Note  I connected with Cathy [name], on 06/30/2019 at 9:49 AM by telephone due to the COVID-19 pandemic and verified that I am speaking with the correct person using two identifiers.   Consent: I discussed the limitations, risks, security and privacy concerns of performing an evaluation and management service by telephone and the availability of in person appointments. I also discussed with the patient that there may be a patient responsible charge related to this service. The patient expressed understanding and agreed to proceed.   Location of Patient: Home  Location of Provider: Clinic   Persons participating in Telemedicine visit: Inanna Booton [place] -CMA Dr. Margarita Rana     History of Present Illness: Cathy [name] is a 58 year old female with a history of hypertension, obstructive sleep apnea, obesity who presents today for a follow-up visit.  She is currently on CPAP for her OSA and reports improvement in sleep on it.   Her grand baby pushed her leg and she fell and hit her right side with a bruise developing and she has been using a heating pad and Ibuprofen with minimal relief. She is currently stressed due to her daughter being locked up in [place] and is using an OTC stress pill as Cymbalta has not been very effective but the OTC medication has been.  She has a knot on her left iris which is not painful and is wondering what that is.  BP at home was 155/110 today but previously was 135/82 (prior to her daughter being locked up) and at previous office visits have been in the systolic range of [id number]. She has no chest pain or dyspnea.  Past Medical History:  Diagnosis Date  . Arthritis   . Back pain   . Depression   . Hyperlipidemia   . Hypertension   . Sleep apnea    cpap   Allergies  Allergen Reactions  . Isovue [Iopamidol] Tinitus    Sneezing and congestion post contrast administration, pt will require premeds for future  contrasted exams    Current Outpatient Medications on File Prior to Visit  Medication Sig Dispense Refill  . albuterol (VENTOLIN HFA) 108 (90 Base) MCG/ACT inhaler Inhale 1-2 puffs into the lungs every 6 (six) hours as needed for wheezing or shortness of breath. 1 Inhaler 6  . amLODipine (NORVASC) 5 MG tablet Take 1 tablet (5 mg total) by mouth daily. 90 tablet 1  . aspirin EC 81 MG tablet Take 81 mg by mouth 2 (two) times daily.    . cloNIDine (CATAPRES) 0.1 MG tablet Take 1 tablet (0.1 mg total) by mouth 2 (two) times daily. 28 tablet 0  . DULoxetine (CYMBALTA) 30 MG capsule Take 1 capsule (30 mg total) by mouth daily. 90 capsule 1  . fluticasone (FLOVENT HFA) 110 MCG/ACT inhaler Inhale 2 puffs into the lungs 2 (two) times daily. 1 Inhaler 6  . meloxicam (MOBIC) 15 MG tablet Take 1 tablet (15 mg total) by mouth daily as needed for pain. 30 tablet 3  . metoprolol tartrate (LOPRESSOR) 25 MG tablet Take 1 tablet (25 mg total) by mouth 2 (two) times daily. 90 tablet 1  . Pumpkin Seed-Soy Germ (AZO BLADDER CONTROL/GO-LESS PO) Take 1 tablet by mouth daily.    [name] atorvastatin (LIPITOR) 10 MG tablet Take 1 tablet (10 mg total) by mouth daily. 90 tablet 1  . nitroGLYCERIN (NITROSTAT) 0.4 MG SL tablet Place 1 tablet (0.4 mg total) under the tongue every 5 (five) minutes as needed  for chest pain. 25 tablet 11   No current facility-administered medications on file prior to visit.     Observations/Objective: Awake, alert, oriented x3 Not in acute distress  Assessment and Plan: 1. Anxiety and depression Uncontrolled due to family stressors OTC anxiolytic has been beneficial and she is content with that - DULoxetine (CYMBALTA) 30 MG capsule; Take 1 capsule (30 mg total) by mouth daily.  Dispense: 90 capsule; Refill: 1  2. Chronic pain of left knee Stable - meloxicam (MOBIC) 15 MG tablet; Take 1 tablet (15 mg total) by mouth daily as needed for pain.  Dispense: 30 tablet; Refill: 3  3. Essential  hypertension Recent elevation due to ongoing stressors No regimen change at this time Counseled on blood pressure goal of less than 130/80, low-sodium, DASH diet, medication compliance, 150 minutes of moderate intensity exercise per week. Discussed medication compliance, adverse effects. - cloNIDine (CATAPRES) 0.1 MG tablet; Take 1 tablet (0.1 mg total) by mouth 2 (two) times daily.  Dispense: 180 tablet; Refill: 1 - Lipid panel; Future - Complete Metabolic Panel with GFR; Future  4. Muscle spasm Secondary to trauma Advised to apply heat - cyclobenzaprine (FLEXERIL) 10 MG tablet; Take 1 tablet (10 mg total) by mouth 2 (two) times daily as needed for muscle spasms.  Dispense: 30 tablet; Refill: 1   Follow Up Instructions: Return in about 6 months (around 12/28/2019) for medical conditions; fasting labs 07/06/19.    I discussed the assessment and treatment plan with the patient. The patient was provided an opportunity to ask questions and all were answered. The patient agreed with the plan and demonstrated an understanding of the instructions.   The patient was advised to call back or seek an in-person evaluation if the symptoms worsen or if the condition fails to improve as anticipated.     I provided 15 minutes total of non-face-to-face time during this encounter including median intraservice time, reviewing previous notes, labs, imaging, medications, management and patient verbalized understanding.     Charlott Rakes, MD, FAAFP. [facility] and [place], [place]   06/30/2019, 9:49 AM

## 2019-07-03 DIAGNOSIS — G4733 Obstructive sleep apnea (adult) (pediatric): Secondary | ICD-10-CM | POA: Diagnosis not present

## 2019-07-06 ENCOUNTER — Ambulatory Visit: Payer: Medicare Other | Attending: Family Medicine

## 2019-07-06 ENCOUNTER — Other Ambulatory Visit: Payer: Self-pay

## 2019-07-06 DIAGNOSIS — I10 Essential (primary) hypertension: Secondary | ICD-10-CM

## 2019-07-07 LAB — CMP14+EGFR
ALT: 23 IU/L (ref 0–32)
AST: 23 IU/L (ref 0–40)
Albumin/Globulin Ratio: 1.5 (ref 1.2–2.2)
Albumin: 4.5 g/dL (ref 3.8–4.9)
Alkaline Phosphatase: 119 IU/L — ABNORMAL HIGH (ref 39–117)
BUN/Creatinine Ratio: 8 — ABNORMAL LOW (ref 9–23)
BUN: 7 mg/dL (ref 6–24)
Bilirubin Total: 0.4 mg/dL (ref 0.0–1.2)
CO2: 25 mmol/L (ref 20–29)
Calcium: 9.6 mg/dL (ref 8.7–10.2)
Chloride: 105 mmol/L (ref 96–106)
Creatinine, Ser: 0.9 mg/dL (ref 0.57–1.00)
GFR calc Af Amer: 83 mL/min/{1.73_m2} (ref >59)
GFR calc non Af Amer: 72 mL/min/{1.73_m2} (ref >59)
Globulin, Total: 3 g/dL (ref 1.5–4.5)
Glucose: 113 mg/dL — ABNORMAL HIGH (ref 65–99)
Potassium: 4.5 mmol/L (ref 3.5–5.2)
Sodium: 142 mmol/L (ref 134–144)
Total Protein: 7.5 g/dL (ref 6.0–8.5)

## 2019-07-07 LAB — LIPID PANEL
Chol/HDL Ratio: 2.3 ratio (ref 0.0–4.4)
Cholesterol, Total: 145 mg/dL (ref 100–199)
HDL: 64 mg/dL (ref >39)
LDL Chol Calc (NIH): 63 mg/dL (ref 0–99)
Triglycerides: 98 mg/dL (ref 0–149)
VLDL Cholesterol Cal: 18 mg/dL (ref 5–40)

## 2019-10-01 DIAGNOSIS — G4733 Obstructive sleep apnea (adult) (pediatric): Secondary | ICD-10-CM | POA: Diagnosis not present

## 2019-10-30 ENCOUNTER — Ambulatory Visit: Payer: Medicare Other | Attending: Internal Medicine

## 2019-10-30 DIAGNOSIS — Z23 Encounter for immunization: Secondary | ICD-10-CM

## 2019-10-30 NOTE — Progress Notes (Signed)
   Covid-19 Vaccination Clinic  Name:  Cathy [name]    MRN: [id number] DOB: Dec 17, 1960  10/30/2019  [name] was observed post Covid-19 immunization for 15 minutes without incident. She was provided with Vaccine Information Sheet and instruction to access the V-Safe system.   [name] was instructed to call 911 with any severe reactions post vaccine: [name] Difficulty breathing  . Swelling of face and throat  . A fast heartbeat  . A bad rash all over body  . Dizziness and weakness   Immunizations Administered    Name Date Dose VIS Date Route   Pfizer COVID-19 Vaccine 10/30/2019  9:26 AM 0.3 mL 07/17/2019 Intramuscular   Manufacturer: [place]   Lot: [id number]   [place]: [id number]

## 2019-11-24 ENCOUNTER — Ambulatory Visit: Payer: Medicare Other | Attending: Internal Medicine

## 2019-11-24 DIAGNOSIS — Z23 Encounter for immunization: Secondary | ICD-10-CM

## 2019-11-24 NOTE — Progress Notes (Signed)
   Covid-19 Vaccination Clinic  Name:  Cathy [name]    MRN: [id number] DOB: 1960-09-30  11/24/2019  [name] was observed post Covid-19 immunization for 15 minutes without incident. She was provided with Vaccine Information Sheet and instruction to access the V-Safe system.   [name] was instructed to call 911 with any severe reactions post vaccine: [name] Difficulty breathing  . Swelling of face and throat  . A fast heartbeat  . A bad rash all over body  . Dizziness and weakness   Immunizations Administered    Name Date Dose VIS Date Route   Pfizer COVID-19 Vaccine 11/24/2019  8:51 AM 0.3 mL 09/30/2018 Intramuscular   Manufacturer: [place]   Lot: [id number]   [place]: [id number]

## 2019-12-17 IMAGING — MG DIGITAL SCREENING BILATERAL MAMMOGRAM WITH TOMO AND CAD
8 series · 8 of 24 positions shown · non-contrast
Comparison: None.

CLINICAL DATA: Screening.

EXAM:
DIGITAL SCREENING BILATERAL MAMMOGRAM WITH TOMO AND CAD

[R CC synth-2D]
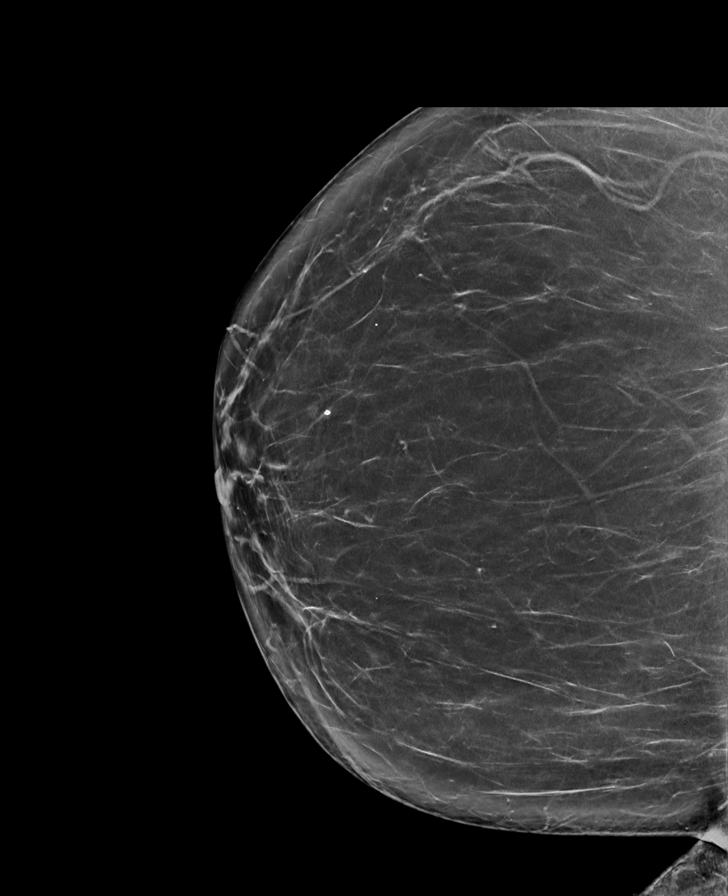

[R MLO synth-2D]
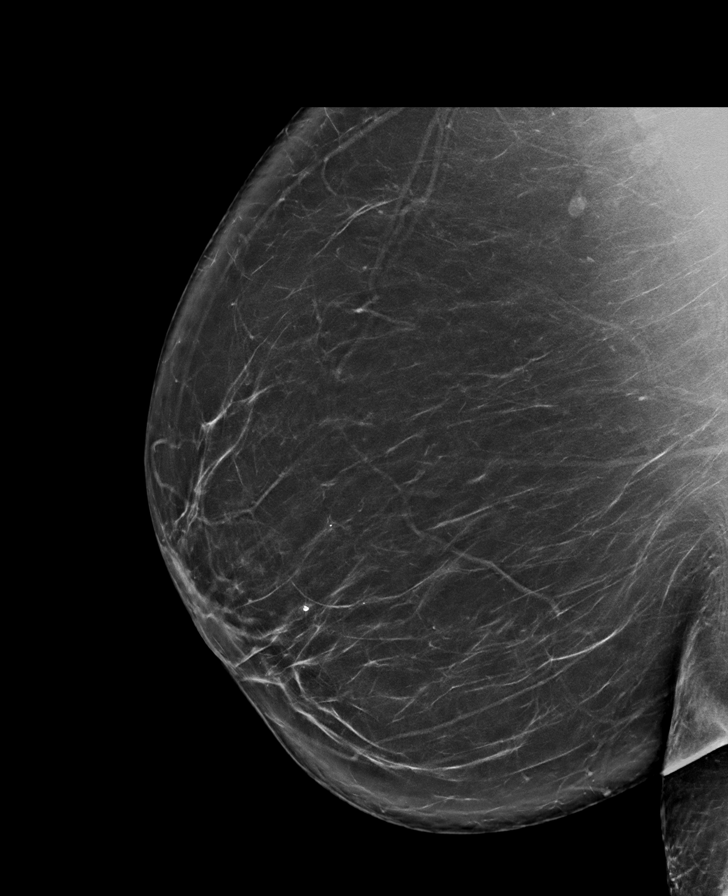

[L MLO synth-2D]
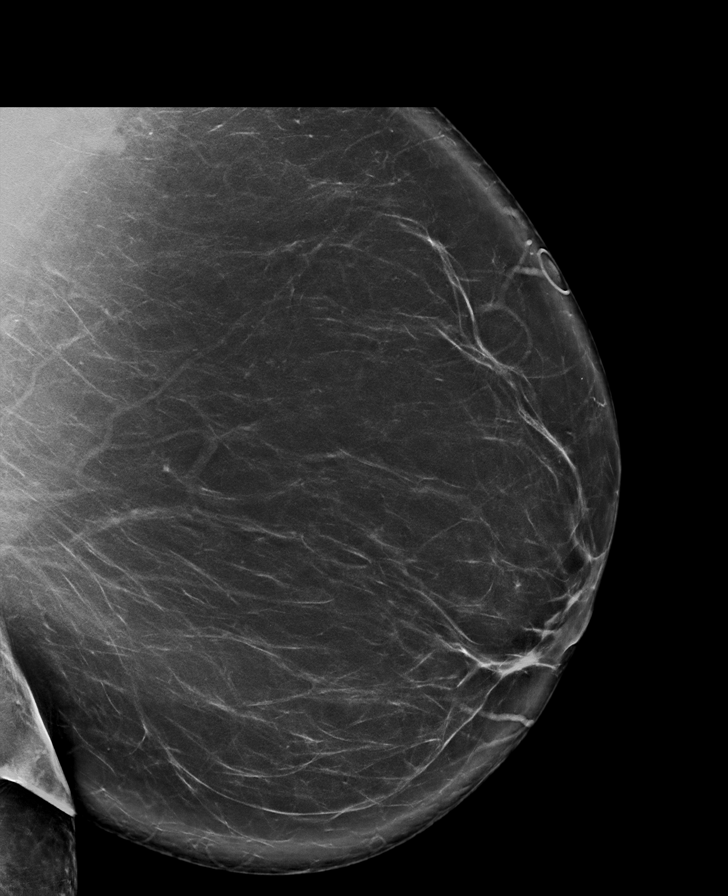

[L CC synth-2D]
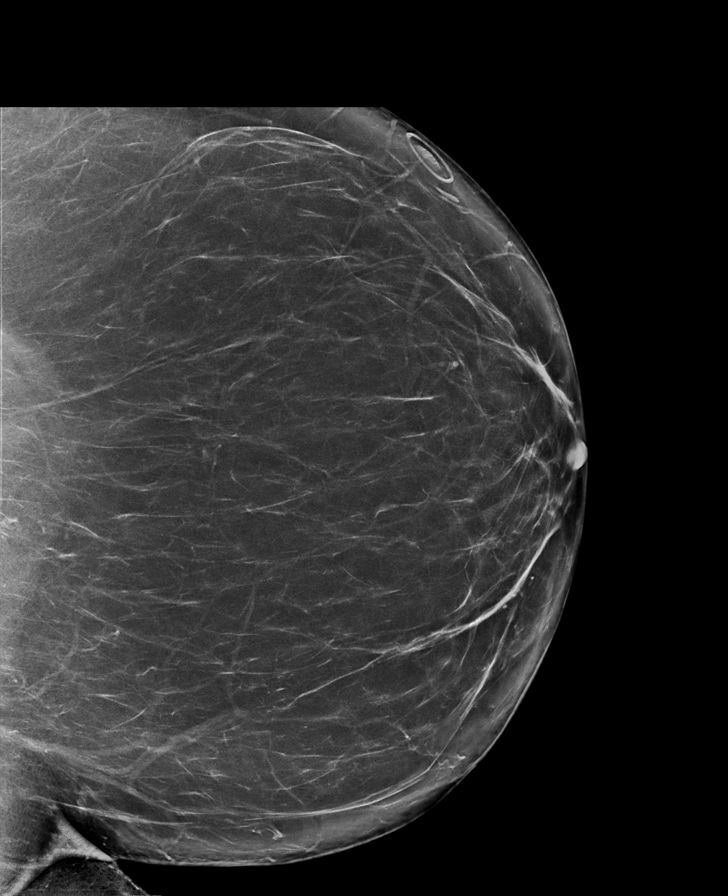

[R MLO tomo · tomo slice 49/96.0]
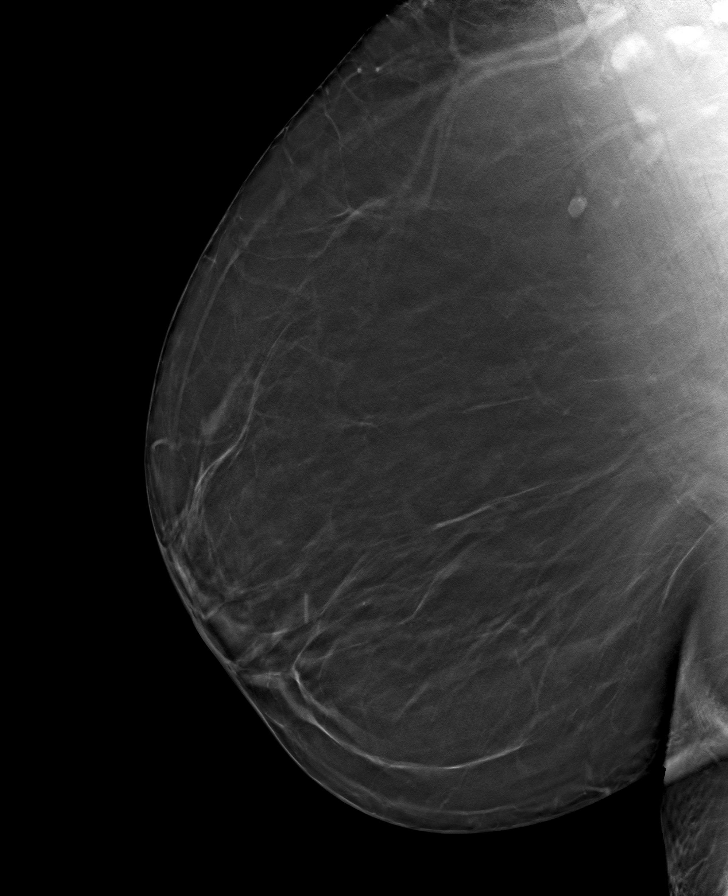

[L MLO tomo · tomo slice 53/105.0]
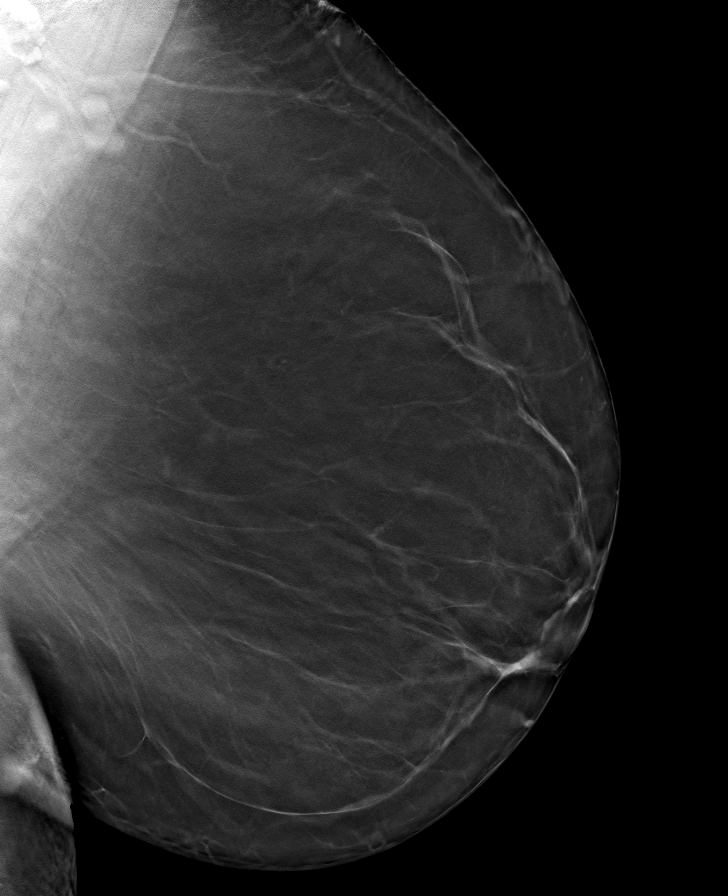

[R CC tomo · tomo slice 45/88.0]
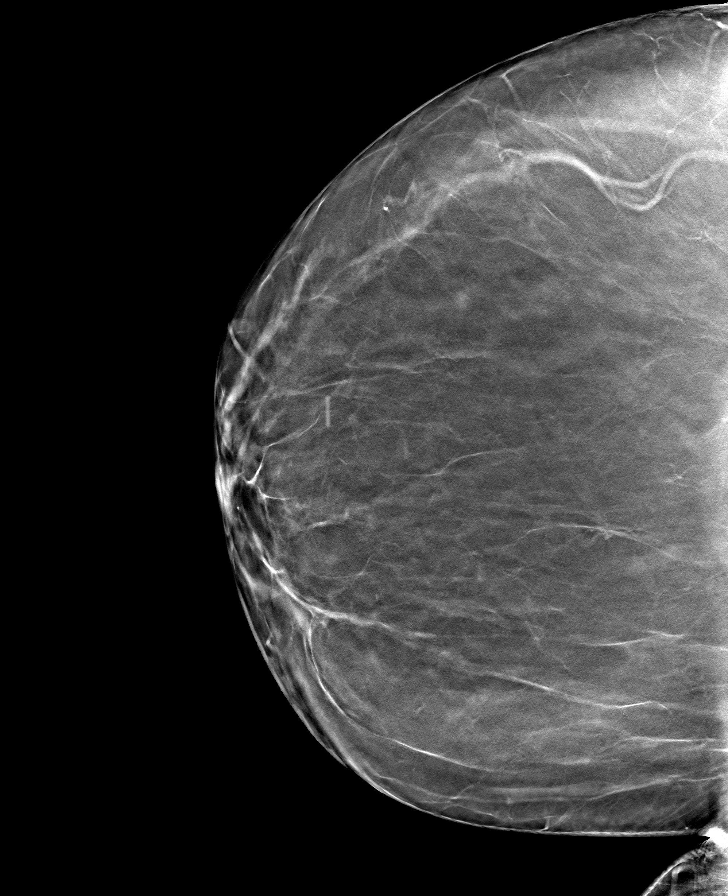

[L CC tomo · tomo slice 47/94.0]
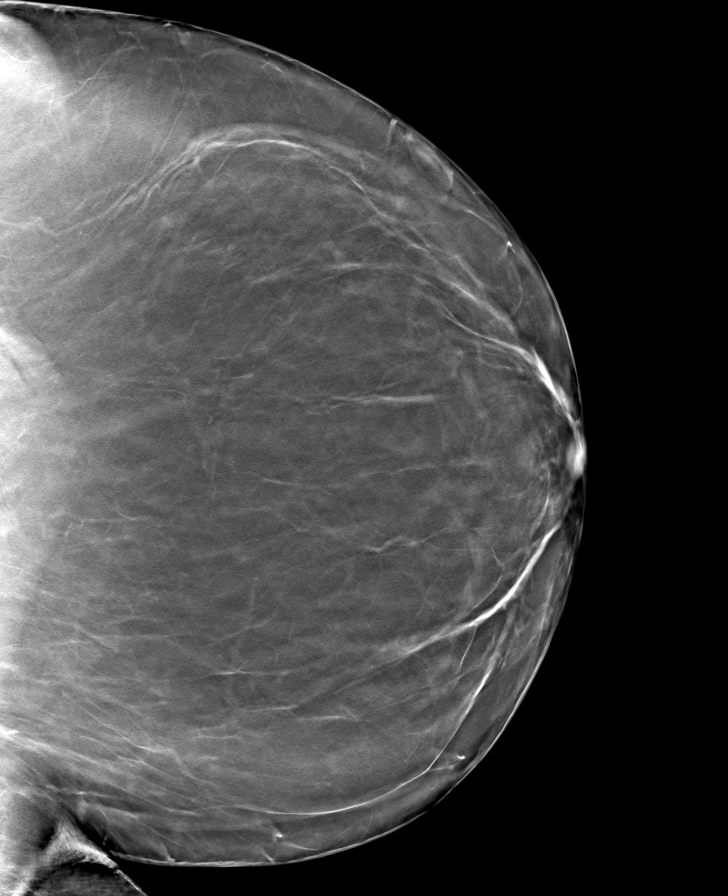

[8 of 24 positions shown; findings below may reference images not displayed]

ACR Breast Density Category b: There are scattered areas of
fibroglandular density.
FINDINGS: There are no findings suspicious for malignancy. Images were
processed with CAD.
IMPRESSION: No mammographic evidence of malignancy. A result letter of this
screening mammogram will be mailed directly to the patient.

RECOMMENDATION:
Screening mammogram in one year. (Code:Y5-G-EJ6)

BI-RADS CATEGORY  1: Negative.

## 2019-12-30 DIAGNOSIS — G4733 Obstructive sleep apnea (adult) (pediatric): Secondary | ICD-10-CM | POA: Diagnosis not present

## 2020-01-06 ENCOUNTER — Ambulatory Visit: Payer: Medicare Other | Attending: Family Medicine | Admitting: Physician Assistant

## 2020-01-06 ENCOUNTER — Other Ambulatory Visit: Payer: Self-pay

## 2020-01-06 VITALS — BP 142/82 | HR 90 | Ht 64.0 in | Wt 291.6 lb

## 2020-01-06 DIAGNOSIS — G8929 Other chronic pain: Secondary | ICD-10-CM

## 2020-01-06 DIAGNOSIS — M62838 Other muscle spasm: Secondary | ICD-10-CM

## 2020-01-06 DIAGNOSIS — Z131 Encounter for screening for diabetes mellitus: Secondary | ICD-10-CM | POA: Diagnosis not present

## 2020-01-06 DIAGNOSIS — F329 Major depressive disorder, single episode, unspecified: Secondary | ICD-10-CM

## 2020-01-06 DIAGNOSIS — F419 Anxiety disorder, unspecified: Secondary | ICD-10-CM

## 2020-01-06 DIAGNOSIS — E785 Hyperlipidemia, unspecified: Secondary | ICD-10-CM | POA: Diagnosis not present

## 2020-01-06 DIAGNOSIS — I10 Essential (primary) hypertension: Secondary | ICD-10-CM | POA: Diagnosis not present

## 2020-01-06 DIAGNOSIS — R0600 Dyspnea, unspecified: Secondary | ICD-10-CM

## 2020-01-06 DIAGNOSIS — R0609 Other forms of dyspnea: Secondary | ICD-10-CM

## 2020-01-06 DIAGNOSIS — F32A Depression, unspecified: Secondary | ICD-10-CM

## 2020-01-06 DIAGNOSIS — M25562 Pain in left knee: Secondary | ICD-10-CM

## 2020-01-06 MED ORDER — AMLODIPINE BESYLATE 5 MG PO TABS: 5.0000 mg | ORAL_TABLET | Freq: Every day | ORAL | 1 refills | Status: DC

## 2020-01-06 MED ORDER — CYCLOBENZAPRINE HCL 10 MG PO TABS: 10.0000 mg | ORAL_TABLET | Freq: Two times a day (BID) | ORAL | 1 refills | Status: DC | PRN

## 2020-01-06 MED ORDER — ATORVASTATIN CALCIUM 10 MG PO TABS: 10.0000 mg | ORAL_TABLET | Freq: Every day | ORAL | 1 refills | Status: DC

## 2020-01-06 MED ORDER — CLONIDINE HCL 0.1 MG PO TABS: 0.1000 mg | ORAL_TABLET | Freq: Two times a day (BID) | ORAL | 1 refills | Status: DC

## 2020-01-06 MED ORDER — METOPROLOL TARTRATE 25 MG PO TABS: 25.0000 mg | ORAL_TABLET | Freq: Two times a day (BID) | ORAL | 1 refills | Status: DC

## 2020-01-06 MED ORDER — ALBUTEROL SULFATE HFA 108 (90 BASE) MCG/ACT IN AERS: 1.0000 | INHALATION_SPRAY | Freq: Four times a day (QID) | RESPIRATORY_TRACT | 1 refills | Status: DC | PRN

## 2020-01-06 MED ORDER — DULOXETINE HCL 30 MG PO CPEP: 30.0000 mg | ORAL_CAPSULE | Freq: Every day | ORAL | 1 refills | Status: DC

## 2020-01-06 MED ORDER — MELOXICAM 15 MG PO TABS: 15.0000 mg | ORAL_TABLET | Freq: Every day | ORAL | 3 refills | Status: DC | PRN

## 2020-01-06 MED ORDER — ASPIRIN EC 81 MG PO TBEC: 81.0000 mg | DELAYED_RELEASE_TABLET | Freq: Two times a day (BID) | ORAL | 1 refills | Status: DC

## 2020-01-06 MED ORDER — FLUTICASONE PROPIONATE HFA 110 MCG/ACT IN AERO: 2.0000 | INHALATION_SPRAY | Freq: Two times a day (BID) | RESPIRATORY_TRACT | 6 refills | Status: DC

## 2020-01-06 NOTE — Progress Notes (Signed)
Cathy [name], is a 59 y.o. female  [id number]  [id number]  DOB - [birth year]  Subjective:  Chief Complaint and HPI: Cathy [name] is a 59 y.o. female here today needing RF for meds.  Starts to get HA when she is out of BP meds.  Cares for her grandchildren which she says causes her stress.  No new issues or concerns today.    ROS:   Constitutional:  No f/c, No night sweats, No unexplained weight loss. EENT:  No vision changes, No blurry vision, No [name] changes. No mouth, throat, or ear problems.  Respiratory: No cough, + SOB/DOE Cardiac: No CP, no palpitations GI:  No abd pain, No N/V/D. GU: No Urinary s/sx Musculoskeletal: various body aches Neuro: No headache, no dizziness, no motor weakness.  Skin: No rash Endocrine:  No polydipsia. No polyuria.  Psych: Denies SI/HI  No problems updated.  ALLERGIES: Allergies  Allergen Reactions  . Isovue [Iopamidol] Tinitus    Sneezing and congestion post contrast administration, pt will require premeds for future contrasted exams    PAST MEDICAL HISTORY: Past Medical History:  Diagnosis Date  . Arthritis   . Back pain   . Depression   . Hyperlipidemia   . Hypertension   . Sleep apnea    cpap    MEDICATIONS AT HOME: Prior to Admission medications   Medication Sig Start Date End Date Taking? Authorizing Provider  albuterol (VENTOLIN HFA) 108 (90 Base) MCG/ACT inhaler Inhale 1-2 puffs into the lungs every 6 (six) hours as needed for wheezing or shortness of breath. 01/06/20  Yes Freeman Caldron M, PA-C  amLODipine (NORVASC) 5 MG tablet Take 1 tablet (5 mg total) by mouth daily. 01/06/20  Yes Argentina Donovan, PA-C  aspirin EC 81 MG tablet Take 81 mg by mouth 2 (two) times daily.   Yes [provider]  cloNIDine (CATAPRES) 0.1 MG tablet Take 1 tablet (0.1 mg total) by mouth 2 (two) times daily. 01/06/20  Yes Freeman Caldron M, PA-C  cyclobenzaprine (FLEXERIL) 10 MG tablet Take 1 tablet (10 mg total) by mouth 2  (two) times daily as needed for muscle spasms. 01/06/20  Yes Argentina Donovan, PA-C  DULoxetine (CYMBALTA) 30 MG capsule Take 1 capsule (30 mg total) by mouth daily. 01/06/20  Yes Freeman Caldron M, PA-C  fluticasone (FLOVENT HFA) 110 MCG/ACT inhaler Inhale 2 puffs into the lungs 2 (two) times daily. 01/06/20  Yes Jem Castro, Dionne Bucy, PA-C  meloxicam (MOBIC) 15 MG tablet Take 1 tablet (15 mg total) by mouth daily as needed for pain. 01/06/20  Yes Zalan Shidler M, PA-C  Pumpkin Seed-Soy Germ (AZO BLADDER CONTROL/GO-LESS PO) Take 1 tablet by mouth daily.   Yes [provider]  atorvastatin (LIPITOR) 10 MG tablet Take 1 tablet (10 mg total) by mouth daily. 01/06/20 04/05/20  Argentina Donovan, PA-C  metoprolol tartrate (LOPRESSOR) 25 MG tablet Take 1 tablet (25 mg total) by mouth 2 (two) times daily. 01/06/20 04/05/20  Argentina Donovan, PA-C  nitroGLYCERIN (NITROSTAT) 0.4 MG SL tablet Place 1 tablet (0.4 mg total) under the tongue every 5 (five) minutes as needed for chest pain. 05/20/17 10/03/18  Park Liter, MD     Objective:  EXAM:   Vitals:   01/06/20 0915  BP: (!) 142/82  Pulse: 90  SpO2: 98%  Weight: 291 lb 9.6 oz (132.3 kg)  Height: 5\' 4"  (1.626 m)    General appearance : A&OX3. NAD. Non-toxic-appearing HEENT: Atraumatic and Normocephalic.  PERRLA.  EOM intact.  TM clear B. Mouth-MMM, post pharynx WNL w/o erythema, No PND. Neck: supple, no JVD. No cervical lymphadenopathy. No thyromegaly Chest/Lungs:  Breathing-non-labored, Good air entry bilaterally, breath sounds normal without rales, rhonchi, or wheezing  CVS: S1 S2 regular, no murmurs, gallops, rubs  Abdomen: Bowel sounds present, Non tender and not distended with no gaurding, rigidity or rebound. Extremities: Bilateral Lower Ext shows no edema, both legs are warm to touch with = pulse throughout Neurology:  CN II-XII grossly intact, Non focal.   Psych:  TP linear. J/I WNL. Normal speech. Appropriate eye contact and affect.   Skin:  No Rash  Data Review No results found for: HGBA1C   Assessment & Plan   1. Essential hypertension Not controlled but off meds x 1 week.  Resume meds and check BP OOO.  If not at goal, schedule appt in 3 weeks.  We have discussed target BP range and blood pressure goal. I have advised patient to check BP regularly and to call us back or report to clinic if the numbers are consistently higher than 140/90. We discussed the importance of compliance with medical therapy and DASH diet recommended, consequences of uncontrolled hypertension discussed.  - Comprehensive metabolic panel - Lipid panel - amLODipine (NORVASC) 5 MG tablet; Take 1 tablet (5 mg total) by mouth daily.  Dispense: 90 tablet; Refill: 1 - cloNIDine (CATAPRES) 0.1 MG tablet; Take 1 tablet (0.1 mg total) by mouth 2 (two) times daily.  Dispense: 180 tablet; Refill: 1 - metoprolol tartrate (LOPRESSOR) 25 MG tablet; Take 1 tablet (25 mg total) by mouth 2 (two) times daily.  Dispense: 180 tablet; Refill: 1 - CBC with Differential/Platelet  2. Hyperlipidemia, unspecified hyperlipidemia type - Lipid panel - atorvastatin (LIPITOR) 10 MG tablet; Take 1 tablet (10 mg total) by mouth daily.  Dispense: 90 tablet; Refill: 1 - CBC with Differential/Platelet  3. Screening for diabetes mellitus I have had a lengthy discussion and provided education about insulin resistance and the intake of too much sugar/refined carbohydrates.  I have advised the patient to work at a goal of eliminating sugary drinks, candy, desserts, sweets, refined sugars, processed foods, and white carbohydrates.  The patient expresses understanding.   - Hemoglobin A1c  4. Dyspnea on exertion - albuterol (VENTOLIN HFA) 108 (90 Base) MCG/ACT inhaler; Inhale 1-2 puffs into the lungs every 6 (six) hours as needed for wheezing or shortness of breath.  Dispense: 18 g; Refill: 1 - fluticasone (FLOVENT HFA) 110 MCG/ACT inhaler; Inhale 2 puffs into the lungs 2 (two)  times daily.  Dispense: 1 Inhaler; Refill: 6  5. Anxiety and depression - DULoxetine (CYMBALTA) 30 MG capsule; Take 1 capsule (30 mg total) by mouth daily.  Dispense: 90 capsule; Refill: 1  6. Muscle spasm - cyclobenzaprine (FLEXERIL) 10 MG tablet; Take 1 tablet (10 mg total) by mouth 2 (two) times daily as needed for muscle spasms.  Dispense: 30 tablet; Refill: 1  7. Chronic pain of left knee - meloxicam (MOBIC) 15 MG tablet; Take 1 tablet (15 mg total) by mouth daily as needed for pain.  Dispense: 30 tablet; Refill: 3  Patient have been counseled extensively about nutrition and exercise  Return in about 6 months (around 07/07/2020) for PCP;  chronic conditions.  The patient was given clear instructions to go to ER or return to medical center if symptoms don't improve, worsen or new problems develop. The patient verbalized understanding. The patient was told to call to get lab results if they  haven't heard anything in the next week.     Freeman Caldron, PA-C [facility] and [place], [place]   01/06/2020, 9:45 AMPatient ID: Cathy [name], female   DOB: [birth year], 59 y.o.   MRN: [id number]

## 2020-01-07 LAB — CBC WITH DIFFERENTIAL/PLATELET
Basophils Absolute: 0 10*3/uL (ref 0.0–0.2)
Basos: 0 %
EOS (ABSOLUTE): 0 10*3/uL (ref 0.0–0.4)
Eos: 1 %
Hematocrit: 35.9 % (ref 34.0–46.6)
Hemoglobin: 11.5 g/dL (ref 11.1–15.9)
Immature Grans (Abs): 0 10*3/uL (ref 0.0–0.1)
Immature Granulocytes: 0 %
Lymphocytes Absolute: 2.4 10*3/uL (ref 0.7–3.1)
Lymphs: 35 %
MCH: 27.8 pg (ref 26.6–33.0)
MCHC: 32 g/dL (ref 31.5–35.7)
MCV: 87 fL (ref 79–97)
Monocytes Absolute: 0.5 10*3/uL (ref 0.1–0.9)
Monocytes: 7 %
Neutrophils Absolute: 3.9 10*3/uL (ref 1.4–7.0)
Neutrophils: 57 %
Platelets: 315 10*3/uL (ref 150–450)
RBC: 4.13 x10E6/uL (ref 3.77–5.28)
RDW: 14.9 % (ref 11.7–15.4)
WBC: 6.8 10*3/uL (ref 3.4–10.8)

## 2020-01-07 LAB — COMPREHENSIVE METABOLIC PANEL
ALT: 31 IU/L (ref 0–32)
AST: 25 IU/L (ref 0–40)
Albumin/Globulin Ratio: 1.6 (ref 1.2–2.2)
Albumin: 4.4 g/dL (ref 3.8–4.9)
Alkaline Phosphatase: 110 IU/L (ref 48–121)
BUN/Creatinine Ratio: 10 (ref 9–23)
BUN: 10 mg/dL (ref 6–24)
Bilirubin Total: 0.3 mg/dL (ref 0.0–1.2)
CO2: 24 mmol/L (ref 20–29)
Calcium: 9.6 mg/dL (ref 8.7–10.2)
Chloride: 105 mmol/L (ref 96–106)
Creatinine, Ser: 0.97 mg/dL (ref 0.57–1.00)
GFR calc Af Amer: 76 mL/min/{1.73_m2} (ref >59)
GFR calc non Af Amer: 65 mL/min/{1.73_m2} (ref >59)
Globulin, Total: 2.8 g/dL (ref 1.5–4.5)
Glucose: 107 mg/dL — ABNORMAL HIGH (ref 65–99)
Potassium: 4.8 mmol/L (ref 3.5–5.2)
Sodium: 142 mmol/L (ref 134–144)
Total Protein: 7.2 g/dL (ref 6.0–8.5)

## 2020-01-07 LAB — LIPID PANEL
Chol/HDL Ratio: 2.6 ratio (ref 0.0–4.4)
Cholesterol, Total: 154 mg/dL (ref 100–199)
HDL: 59 mg/dL (ref >39)
LDL Chol Calc (NIH): 74 mg/dL (ref 0–99)
Triglycerides: 120 mg/dL (ref 0–149)
VLDL Cholesterol Cal: 21 mg/dL (ref 5–40)

## 2020-01-07 LAB — HEMOGLOBIN A1C
Est. average glucose Bld gHb Est-mCnc: 128 mg/dL
Hgb A1c MFr Bld: 6.1 % — ABNORMAL HIGH (ref 4.8–5.6)

## 2020-03-29 DIAGNOSIS — G4733 Obstructive sleep apnea (adult) (pediatric): Secondary | ICD-10-CM | POA: Diagnosis not present

## 2020-06-27 DIAGNOSIS — G4733 Obstructive sleep apnea (adult) (pediatric): Secondary | ICD-10-CM | POA: Diagnosis not present

## 2020-07-07 ENCOUNTER — Telehealth: Payer: Self-pay | Admitting: Family Medicine

## 2020-07-07 ENCOUNTER — Ambulatory Visit: Payer: Medicare Other | Attending: Family Medicine | Admitting: Family Medicine

## 2020-07-07 ENCOUNTER — Other Ambulatory Visit: Payer: Self-pay

## 2020-07-07 ENCOUNTER — Encounter: Payer: Self-pay | Admitting: Family Medicine

## 2020-07-07 VITALS — BP 114/78 | HR 67 | Ht 64.0 in | Wt 294.0 lb

## 2020-07-07 DIAGNOSIS — Z1231 Encounter for screening mammogram for malignant neoplasm of breast: Secondary | ICD-10-CM

## 2020-07-07 DIAGNOSIS — R0609 Other forms of dyspnea: Secondary | ICD-10-CM

## 2020-07-07 DIAGNOSIS — R0600 Dyspnea, unspecified: Secondary | ICD-10-CM | POA: Diagnosis not present

## 2020-07-07 DIAGNOSIS — F419 Anxiety disorder, unspecified: Secondary | ICD-10-CM

## 2020-07-07 DIAGNOSIS — M62838 Other muscle spasm: Secondary | ICD-10-CM | POA: Diagnosis not present

## 2020-07-07 DIAGNOSIS — I10 Essential (primary) hypertension: Secondary | ICD-10-CM

## 2020-07-07 DIAGNOSIS — E785 Hyperlipidemia, unspecified: Secondary | ICD-10-CM

## 2020-07-07 DIAGNOSIS — R21 Rash and other nonspecific skin eruption: Secondary | ICD-10-CM

## 2020-07-07 DIAGNOSIS — F32A Depression, unspecified: Secondary | ICD-10-CM

## 2020-07-07 MED ORDER — ALBUTEROL SULFATE HFA 108 (90 BASE) MCG/ACT IN AERS: 1.0000 | INHALATION_SPRAY | Freq: Four times a day (QID) | RESPIRATORY_TRACT | 1 refills | Status: DC | PRN

## 2020-07-07 MED ORDER — FLUTICASONE PROPIONATE HFA 110 MCG/ACT IN AERO: 2.0000 | INHALATION_SPRAY | Freq: Two times a day (BID) | RESPIRATORY_TRACT | 1 refills | Status: DC

## 2020-07-07 MED ORDER — METOPROLOL TARTRATE 25 MG PO TABS: 25.0000 mg | ORAL_TABLET | Freq: Two times a day (BID) | ORAL | 1 refills | Status: DC

## 2020-07-07 MED ORDER — TIZANIDINE HCL 4 MG PO TABS: 4.0000 mg | ORAL_TABLET | Freq: Three times a day (TID) | ORAL | 1 refills | Status: DC | PRN

## 2020-07-07 MED ORDER — CLONIDINE HCL 0.1 MG PO TABS: 0.1000 mg | ORAL_TABLET | Freq: Two times a day (BID) | ORAL | 1 refills | Status: DC

## 2020-07-07 MED ORDER — AMLODIPINE BESYLATE 5 MG PO TABS: 5.0000 mg | ORAL_TABLET | Freq: Every day | ORAL | 1 refills | Status: DC

## 2020-07-07 MED ORDER — DULOXETINE HCL 30 MG PO CPEP: 30.0000 mg | ORAL_CAPSULE | Freq: Every day | ORAL | 1 refills | Status: DC

## 2020-07-07 MED ORDER — TRIAMCINOLONE ACETONIDE 0.1 % EX CREA: 1.0000 "application " | TOPICAL_CREAM | Freq: Two times a day (BID) | CUTANEOUS | 0 refills | Status: DC

## 2020-07-07 MED ORDER — ATORVASTATIN CALCIUM 10 MG PO TABS: 10.0000 mg | ORAL_TABLET | Freq: Every day | ORAL | 1 refills | Status: DC

## 2020-07-07 NOTE — Patient Instructions (Signed)
Muscle Cramps and Spasms Muscle cramps and spasms are when muscles tighten by themselves. They usually get better within minutes. Muscle cramps are painful. They are usually stronger and last longer than muscle spasms. Muscle spasms may or may not be painful. They can last a few seconds or much longer. Cramps and spasms can affect any muscle, but they occur most often in the calf muscles of the leg. They are usually not caused by a serious problem. In many cases, the cause is not known. Some common causes include:  Doing more physical work or exercise than your body is ready for.  Using the muscles too much (overuse) by repeating certain movements too many times.  Staying in a certain position for a long time.  Playing a sport or doing an activity without preparing properly.  Using bad form or technique while playing a sport or doing an activity.  Not having enough water in your body (dehydration).  Injury.  Side effects of some medicines.  Low levels of the salts and minerals in your blood (electrolytes), such as low potassium or calcium. Follow these instructions at home: Managing pain and stiffness      Massage, stretch, and relax the muscle. Do this for many minutes at a time.  If told, put heat on tight or tense muscles as often as told by your doctor. Use the heat source that your doctor recommends, such as a moist heat pack or a heating pad. ? Place a towel between your skin and the heat source. ? Leave the heat on for 20-30 minutes. ? Remove the heat if your skin turns bright red. This is very important if you are not able to feel pain, heat, or cold. You may have a greater risk of getting burned.  If told, put ice on the affected area. This may help if you are sore or have pain after a cramp or spasm. ? Put ice in a plastic bag. ? Place a towel between your skin and the bag. ? Leave the ice on for 20 minutes, 2-3 times a day.  Try taking hot showers or baths to help  relax tight muscles. Eating and drinking  Drink enough fluid to keep your pee (urine) pale yellow.  Eat a healthy diet to help ensure that your muscles work well. This should include: ? Fruits and vegetables. ? Lean protein. ? Whole grains. ? Low-fat or nonfat dairy products. General instructions  If you are having cramps often, avoid intense exercise for several days.  Take over-the-counter and prescription medicines only as told by your doctor.  Watch for any changes in your symptoms.  Keep all follow-up visits as told by your doctor. This is important. Contact a doctor if:  Your cramps or spasms get worse or happen more often.  Your cramps or spasms do not get better with time. Summary  Muscle cramps and spasms are when muscles tighten by themselves. They usually get better within minutes.  Cramps and spasms occur most often in the calf muscles of the leg.  Massage, stretch, and relax the muscle. This may help the cramp or spasm go away.  Drink enough fluid to keep your pee (urine) pale yellow. This information is not intended to replace advice given to you by your health care provider. Make sure you discuss any questions you have with your health care provider. Document Revised: 12/16/2017 Document Reviewed: 12/16/2017 Elsevier Patient Education  2020 [employer] Inc.  

## 2020-07-07 NOTE — Progress Notes (Signed)
Having pain on entire left side.  Needs refills.

## 2020-07-07 NOTE — Telephone Encounter (Signed)
Pharmacy was called and informed that she should have cream format.

## 2020-07-07 NOTE — Progress Notes (Signed)
Subjective:  Patient ID: Cathy [name], female    DOB: 11-Apr-1961  Age: 59 y.o. MRN: [id number]  CC: Hypertension   HPI Cathy [name] is a 59 year old female with a history of hypertension, obstructive sleep apnea, obesity who presents today for a follow-up visit. She is currently on CPAP for her OSA  She has left sided pain for the last 2 weeks and when she lies on her left side she can hardly get up.  Denies heavy lifting or trauma to the left side.  Sometimes with deep breathing pain occurs.  She denies presence of rash. She has had a course of rash on her elbows bilaterally since she had COVID in 2019 she says.  She finds herself picking at the rash.  Doing well on her antihypertensive and has no adverse effects from her medications.  Past Medical History:  Diagnosis Date  . Arthritis   . Back pain   . Depression   . Hyperlipidemia   . Hypertension   . Sleep apnea    cpap    Past Surgical History:  Procedure Laterality Date  . BACK SURGERY    . [place]   states constipated since then  . KNEE SURGERY    . LEFT HEART CATH AND CORONARY ANGIOGRAPHY N/A 05/20/2017   Procedure: LEFT HEART CATH AND CORONARY ANGIOGRAPHY;  Surgeon: Leonie Man, MD;  Location: [place] [facility];  Service: Cardiovascular;  Laterality: N/A;    Family History  Problem Relation Age of Onset  . Stroke Mother   . Cancer Father   . Colon cancer Father   . Heart disease Brother   . Colon polyps Neg Hx   . Esophageal cancer Neg Hx   . Rectal cancer Neg Hx   . Stomach cancer Neg Hx     Allergies  Allergen Reactions  . Isovue [Iopamidol] Tinitus    Sneezing and congestion post contrast administration, pt will require premeds for future contrasted exams    Outpatient Medications Prior to Visit  Medication Sig Dispense Refill  . albuterol (VENTOLIN HFA) 108 (90 Base) MCG/ACT inhaler Inhale 1-2 puffs into the lungs every 6 (six) hours as needed for wheezing or  shortness of breath. 18 g 1  . amLODipine (NORVASC) 5 MG tablet Take 1 tablet (5 mg total) by mouth daily. 90 tablet 1  . aspirin EC 81 MG tablet Take 1 tablet (81 mg total) by mouth 2 (two) times daily. 100 tablet 1  . cloNIDine (CATAPRES) 0.1 MG tablet Take 1 tablet (0.1 mg total) by mouth 2 (two) times daily. 180 tablet 1  . cyclobenzaprine (FLEXERIL) 10 MG tablet Take 1 tablet (10 mg total) by mouth 2 (two) times daily as needed for muscle spasms. 30 tablet 1  . DULoxetine (CYMBALTA) 30 MG capsule Take 1 capsule (30 mg total) by mouth daily. 90 capsule 1  . fluticasone (FLOVENT HFA) 110 MCG/ACT inhaler Inhale 2 puffs into the lungs 2 (two) times daily. 1 Inhaler 6  . meloxicam (MOBIC) 15 MG tablet Take 1 tablet (15 mg total) by mouth daily as needed for pain. 30 tablet 3  . Pumpkin Seed-Soy Germ (AZO BLADDER CONTROL/GO-LESS PO) Take 1 tablet by mouth daily.    [name] atorvastatin (LIPITOR) 10 MG tablet Take 1 tablet (10 mg total) by mouth daily. 90 tablet 1  . metoprolol tartrate (LOPRESSOR) 25 MG tablet Take 1 tablet (25 mg total) by mouth 2 (two) times daily. 180 tablet 1  .  nitroGLYCERIN (NITROSTAT) 0.4 MG SL tablet Place 1 tablet (0.4 mg total) under the tongue every 5 (five) minutes as needed for chest pain. 25 tablet 11   No facility-administered medications prior to visit.     ROS Review of Systems  Constitutional: Negative for activity change, appetite change and fatigue.  HENT: Negative for congestion, sinus pressure and sore throat.   Eyes: Negative for visual disturbance.  Respiratory: Negative for cough, chest tightness, shortness of breath and wheezing.   Cardiovascular: Negative for chest pain and palpitations.  Gastrointestinal: Negative for abdominal distention, abdominal pain and constipation.  Endocrine: Negative for polydipsia.  Genitourinary: Negative for dysuria and frequency.  Musculoskeletal: Negative for arthralgias and back pain.  Skin: Positive for rash.    Neurological: Negative for tremors, light-headedness and numbness.  Hematological: Does not bruise/bleed easily.  Psychiatric/Behavioral: Negative for agitation and behavioral problems.    Objective:  BP 114/78   Pulse 67   Ht 5\' 4"  (1.626 m)   Wt 294 lb (133.4 kg)   SpO2 99%   BMI 50.46 kg/m   BP/Weight 07/07/2020 01/06/9475 12/07/6501  Systolic BP 546 568 127  Diastolic BP 78 82 78  Wt. (Lbs) 294 291.6 277.2  BMI 50.46 50.05 47.58      Physical Exam Constitutional:      Appearance: She is well-developed.  Neck:     Vascular: No JVD.  Cardiovascular:     Rate and Rhythm: Normal rate.     Heart sounds: Normal heart sounds. No murmur heard.   Pulmonary:     Effort: Pulmonary effort is normal.     Breath sounds: Normal breath sounds. No wheezing or rales.  Chest:     Chest wall: No tenderness.  Abdominal:     General: Bowel sounds are normal. There is no distension.     Palpations: Abdomen is soft. There is no mass.     Tenderness: There is no abdominal tenderness.  Musculoskeletal:        General: Normal range of motion.     Right lower leg: No edema.     Left lower leg: No edema.  Skin:    Findings: Rash (coarse hyperpigmented rash on elbows bilaterally) present.  Neurological:     Mental Status: She is alert and oriented to person, place, and time.  Psychiatric:        Mood and Affect: Mood normal.     CMP Latest Ref Rng & Units 01/06/2020 07/06/2019 09/24/2018  Glucose 65 - 99 mg/dL 107(H) 113(H) 98  BUN 6 - 24 mg/dL 10 7 14   Creatinine 0.57 - 1.00 mg/dL 0.97 0.90 1.00  Sodium 134 - 144 mmol/L 142 142 144  Potassium 3.5 - 5.2 mmol/L 4.8 4.5 4.2  Chloride 96 - 106 mmol/L 105 105 103  CO2 20 - 29 mmol/L 24 25 22   Calcium 8.7 - 10.2 mg/dL 9.6 9.6 9.6  Total Protein 6.0 - 8.5 g/dL 7.2 7.5 -  Total Bilirubin 0.0 - 1.2 mg/dL 0.3 0.4 -  Alkaline Phos 48 - 121 IU/L 110 119(H) -  AST 0 - 40 IU/L 25 23 -  ALT 0 - 32 IU/L 31 23 -    Lipid Panel     Component  Value Date/Time   CHOL 154 01/06/2020 0951   TRIG 120 01/06/2020 0951   HDL 59 01/06/2020 0951   CHOLHDL 2.6 01/06/2020 0951   CHOLHDL 2.3 03/07/2009 0902   VLDL 10 03/07/2009 0902   LDLCALC 74 01/06/2020 0951  CBC    Component Value Date/Time   WBC 6.8 01/06/2020 0951   WBC 4.9 05/20/2017 0551   RBC 4.13 01/06/2020 0951   RBC 4.23 05/20/2017 0551   HGB 11.5 01/06/2020 0951   HCT 35.9 01/06/2020 0951   PLT 315 01/06/2020 0951   MCV 87 01/06/2020 0951   MCH 27.8 01/06/2020 0951   MCH 27.0 05/20/2017 0551   MCHC 32.0 01/06/2020 0951   MCHC 29.9 (L) 05/20/2017 0551   RDW 14.9 01/06/2020 0951   LYMPHSABS 2.4 01/06/2020 0951   MONOABS 0.3 03/31/2015 2055   EOSABS 0.0 01/06/2020 0951   BASOSABS 0.0 01/06/2020 0951    Lab Results  Component Value Date   HGBA1C 6.1 (H) 01/06/2020    Assessment & Plan:  1. Essential hypertension Controlled Counseled on blood pressure goal of less than 130/80, low-sodium, DASH diet, medication compliance, 150 minutes of moderate intensity exercise per week. Discussed medication compliance, adverse effects. - metoprolol tartrate (LOPRESSOR) 25 MG tablet; Take 1 tablet (25 mg total) by mouth 2 (two) times daily.  Dispense: 180 tablet; Refill: 1 - cloNIDine (CATAPRES) 0.1 MG tablet; Take 1 tablet (0.1 mg total) by mouth 2 (two) times daily.  Dispense: 180 tablet; Refill: 1 - amLODipine (NORVASC) 5 MG tablet; Take 1 tablet (5 mg total) by mouth daily.  Dispense: 90 tablet; Refill: 1 - Basic Metabolic Panel  2. Dyspnea on exertion Stable - fluticasone (FLOVENT HFA) 110 MCG/ACT inhaler; Inhale 2 puffs into the lungs 2 (two) times daily.  Dispense: 12 g; Refill: 1 - albuterol (VENTOLIN HFA) 108 (90 Base) MCG/ACT inhaler; Inhale 1-2 puffs into the lungs every 6 (six) hours as needed for wheezing or shortness of breath.  Dispense: 18 g; Refill: 1  3. Anxiety and depression Controlled - DULoxetine (CYMBALTA) 30 MG capsule; Take 1 capsule (30 mg  total) by mouth daily.  Dispense: 90 capsule; Refill: 1  4. Hyperlipidemia, unspecified hyperlipidemia type Controlled Low-cholesterol diet - atorvastatin (LIPITOR) 10 MG tablet; Take 1 tablet (10 mg total) by mouth daily.  Dispense: 90 tablet; Refill: 1  5. Muscle spasm Advised to apply heating pad - tiZANidine (ZANAFLEX) 4 MG tablet; Take 1 tablet (4 mg total) by mouth every 8 (eight) hours as needed for muscle spasms.  Dispense: 60 tablet; Refill: 1  6. Rash - triamcinolone (KENALOG) 0.1 %; Apply 1 application topically 2 (two) times daily.  Dispense: 30 g; Refill: 0  7. Encounter for screening mammogram for malignant neoplasm of breast - MM DIGITAL SCREENING BILATERAL; Future   No orders of the defined types were placed in this encounter.   Follow-up: No follow-ups on file.       Charlott Rakes, MD, FAAFP. [facility] and [place], [place]   07/07/2020, 8:49 AM

## 2020-07-07 NOTE — Telephone Encounter (Signed)
Pharmacy called to clarify the script that was sent in.  Would like to know if doctor wants patient to get the cream, ointment or lotion.  Please call to clarify at [phone]

## 2020-07-08 ENCOUNTER — Telehealth: Payer: Self-pay

## 2020-07-08 LAB — BASIC METABOLIC PANEL
BUN/Creatinine Ratio: 9 (ref 9–23)
BUN: 8 mg/dL (ref 6–24)
CO2: 21 mmol/L (ref 20–29)
Calcium: 9 mg/dL (ref 8.7–10.2)
Chloride: 108 mmol/L — ABNORMAL HIGH (ref 96–106)
Creatinine, Ser: 0.94 mg/dL (ref 0.57–1.00)
GFR calc Af Amer: 78 mL/min/{1.73_m2} (ref >59)
GFR calc non Af Amer: 68 mL/min/{1.73_m2} (ref >59)
Glucose: 115 mg/dL — ABNORMAL HIGH (ref 65–99)
Potassium: 4.5 mmol/L (ref 3.5–5.2)
Sodium: 142 mmol/L (ref 134–144)

## 2020-07-08 NOTE — Telephone Encounter (Signed)
-----   Message from Charlott Rakes, MD sent at 07/08/2020  9:43 AM EST ----- Please inform the patient that labs are normal. Thank you.

## 2020-07-08 NOTE — Telephone Encounter (Signed)
Pt was called and a VM was left informing pt of normal lab results. 

## 2020-09-26 DIAGNOSIS — G4733 Obstructive sleep apnea (adult) (pediatric): Secondary | ICD-10-CM | POA: Diagnosis not present

## 2020-11-29 ENCOUNTER — Ambulatory Visit: Payer: Medicare Other | Admitting: Critical Care Medicine

## 2020-11-29 ENCOUNTER — Other Ambulatory Visit: Payer: Self-pay

## 2020-11-29 VITALS — BP 126/89 | HR 72 | Temp 98.2°F | Resp 18 | Ht 64.0 in | Wt 294.0 lb

## 2020-11-29 DIAGNOSIS — R059 Cough, unspecified: Secondary | ICD-10-CM | POA: Insufficient documentation

## 2020-11-29 HISTORY — DX: Cough, unspecified: R05.9

## 2020-11-29 LAB — POC COVID19 BINAXNOW: SARS Coronavirus 2 Ag: NEGATIVE

## 2020-11-29 MED ORDER — BENZONATATE 100 MG PO CAPS: ORAL_CAPSULE | ORAL | 4 refills | Status: DC

## 2020-11-29 MED ORDER — PROMETHAZINE-DM 6.25-15 MG/5ML PO SYRP: ORAL_SOLUTION | ORAL | 0 refills | Status: DC

## 2020-11-29 MED ORDER — PREDNISONE 10 MG PO TABS: ORAL_TABLET | ORAL | 0 refills | Status: DC

## 2020-11-29 MED ORDER — OMEPRAZOLE 40 MG PO CPDR: 40.0000 mg | DELAYED_RELEASE_CAPSULE | Freq: Every day | ORAL | 3 refills | Status: DC

## 2020-11-29 MED ORDER — FLUTICASONE PROPIONATE 50 MCG/ACT NA SUSP: 2.0000 | Freq: Every day | NASAL | 6 refills | Status: DC

## 2020-11-29 NOTE — Assessment & Plan Note (Signed)
Chronic cyclic cough COVID-negative  Plan Flonase 2 sprays each nostril daily and pulse prednisone for 5 days  Plan cough protocol including benzonatate 100 mg 3 times daily for 2 days then 1 3 times a day as needed also use promethazine dextromethorphan at night if needed for severe cough spells  Plan also omeprazole 40 mg daily and will give reflux diet  Return to clinic for follow-up

## 2020-11-29 NOTE — Progress Notes (Signed)
Subjective:    Patient ID: Cathy [name], female    DOB: 1961-02-27, 60 y.o.   MRN: [id number]  59 y.o.F Primary care patient of Dr Margarita Rana  Here for chronic cough of 2 months duration.  Patient had COVID test on arrival was negative.  Patient states she has had nasal congestion heartburn belching some throat irritation.  She does have sleep apnea is on a CPAP machine.  History of asthma moderate persistent on Flovent and albuterol.  On arrival blood pressure 126/89.  No other voiced complaints.  Review cough assessment below  Cough This is a new problem. The current episode started 1 to 4 weeks ago. The problem has been unchanged. The problem occurs every few minutes. The cough is non-productive. Associated symptoms include heartburn, nasal congestion, postnasal drip, rhinorrhea and shortness of breath. Pertinent negatives include no chest pain, chills, ear congestion, ear pain, fever, headaches, hemoptysis, myalgias, rash, sore throat, sweats, weight loss or wheezing. The symptoms are aggravated by lying down, exercise, other, pollens and stress. She has tried a beta-agonist inhaler and steroid inhaler for the symptoms. The treatment provided mild relief. Her past medical history is significant for asthma.   Past Medical History:  Diagnosis Date  . Arthritis   . Back pain   . Depression   . Hyperlipidemia   . Hypertension   . Sleep apnea    cpap     Family History  Problem Relation Age of Onset  . Stroke Mother   . Cancer Father   . Colon cancer Father   . Heart disease Brother   . Colon polyps Neg Hx   . Esophageal cancer Neg Hx   . Rectal cancer Neg Hx   . Stomach cancer Neg Hx      Social History   Socioeconomic History  . Marital status: Divorced    Spouse name: Not on file  . Number of children: Not on file  . Years of education: Not on file  . Highest education level: Not on file  Occupational History  . Not on file  Tobacco Use  . Smoking status: Former  Smoker    Types: Cigarettes    Quit date: 1995    Years since quitting: 27.3  . Smokeless tobacco: Never Used  Vaping Use  . Vaping Use: Never used  Substance and Sexual Activity  . Alcohol use: No  . Drug use: No  . Sexual activity: Not on file  Other Topics Concern  . Not on file  Social History Narrative  . Not on file   Social Determinants of Health   Financial Resource Strain: Not on file  Food Insecurity: Not on file  Transportation Needs: Not on file  Physical Activity: Not on file  Stress: Not on file  Social Connections: Not on file  Intimate Partner Violence: Not on file     Allergies  Allergen Reactions  . Isovue [Iopamidol] Tinitus    Sneezing and congestion post contrast administration, pt will require premeds for future contrasted exams     Outpatient Medications Prior to Visit  Medication Sig Dispense Refill  . albuterol (VENTOLIN HFA) 108 (90 Base) MCG/ACT inhaler Inhale 1-2 puffs into the lungs every 6 (six) hours as needed for wheezing or shortness of breath. 18 g 1  . amLODipine (NORVASC) 5 MG tablet Take 1 tablet (5 mg total) by mouth daily. 90 tablet 1  . aspirin EC 81 MG tablet Take 1 tablet (81 mg total) by mouth 2 (two)  times daily. 100 tablet 1  . cloNIDine (CATAPRES) 0.1 MG tablet Take 1 tablet (0.1 mg total) by mouth 2 (two) times daily. 180 tablet 1  . DULoxetine (CYMBALTA) 30 MG capsule Take 1 capsule (30 mg total) by mouth daily. 90 capsule 1  . fluticasone (FLOVENT HFA) 110 MCG/ACT inhaler Inhale 2 puffs into the lungs 2 (two) times daily. 12 g 1  . meloxicam (MOBIC) 15 MG tablet Take 1 tablet (15 mg total) by mouth daily as needed for pain. 30 tablet 3  . Pumpkin Seed-Soy Germ (AZO BLADDER CONTROL/GO-LESS PO) Take 1 tablet by mouth daily.    [name] tiZANidine (ZANAFLEX) 4 MG tablet Take 1 tablet (4 mg total) by mouth every 8 (eight) hours as needed for muscle spasms. 60 tablet 1  . triamcinolone (KENALOG) 0.1 % Apply 1 application topically 2  (two) times daily. 30 g 0  . atorvastatin (LIPITOR) 10 MG tablet Take 1 tablet (10 mg total) by mouth daily. 90 tablet 1  . metoprolol tartrate (LOPRESSOR) 25 MG tablet Take 1 tablet (25 mg total) by mouth 2 (two) times daily. 180 tablet 1  . nitroGLYCERIN (NITROSTAT) 0.4 MG SL tablet Place 1 tablet (0.4 mg total) under the tongue every 5 (five) minutes as needed for chest pain. 25 tablet 11   No facility-administered medications prior to visit.      Review of Systems  Constitutional: Negative for chills, fever and weight loss.  HENT: Positive for postnasal drip and rhinorrhea. Negative for ear pain and sore throat.   Respiratory: Positive for cough and shortness of breath. Negative for hemoptysis and wheezing.   Cardiovascular: Negative for chest pain.  Gastrointestinal: Positive for heartburn.  Musculoskeletal: Negative for myalgias.  Skin: Negative for rash.  Neurological: Negative for headaches.       Objective:   Physical Exam Vitals:   11/29/20 1447  BP: 126/89  Pulse: 72  Resp: 18  Temp: 98.2 F (36.8 C)  TempSrc: Oral  SpO2: 98%  Weight: 294 lb (133.4 kg)  Height: 5\' 4"  (1.626 m)    Gen: Pleasant, obese, in no distress,  normal affect  ENT: No lesions,  mouth clear,  oropharynx clear, no postnasal drip  Neck: No JVD, no TMG, no carotid bruits  Lungs: No use of accessory muscles, no dullness to percussion, clear without rales or rhonchi  Cardiovascular: RRR, heart sounds normal, no murmur or gallops, no peripheral edema  Abdomen: soft and NT, no HSM,  BS normal  Musculoskeletal: No deformities, no cyanosis or clubbing  Neuro: alert, non focal  Skin: Warm, no lesions or rashes         Assessment & Plan:  I personally reviewed all images and lab data in the [facility] system as well as any outside material available during this office visit and agree with the  radiology impressions.   Cough Chronic cyclic cough COVID-negative  Plan Flonase 2 sprays each  nostril daily and pulse prednisone for 5 days  Plan cough protocol including benzonatate 100 mg 3 times daily for 2 days then 1 3 times a day as needed also use promethazine dextromethorphan at night if needed for severe cough spells  Plan also omeprazole 40 mg daily and will give reflux diet  Return to clinic for follow-up   Tyger was seen today for cough.  Diagnoses and all orders for this visit:  Cough -     POC COVID-19  Other orders -     fluticasone (FLONASE) 50 MCG/ACT  nasal spray; Place 2 sprays into both nostrils daily. -     predniSONE (DELTASONE) 10 MG tablet; Take 4 tablets daily for 5 days then stop -     benzonatate (TESSALON) 100 MG capsule; Take one three times a day for two days then take one three times a day as needed -     promethazine-dextromethorphan (PROMETHAZINE-DM) 6.25-15 MG/5ML syrup; Take 5 ML at night as needed for cough , may take for severe cough in daytime -     omeprazole (PRILOSEC) 40 MG capsule; Take 1 capsule (40 mg total) by mouth daily.  I spent 25 minutes on this patient with interview reviewing chart and doing an exam formulating a medium medical decision making and complexity

## 2020-11-29 NOTE — Progress Notes (Signed)
Patient reports a cough beginning 2 weeks ago, Patient now reports congestion in her chest which is clear. Patient reports taking 6 baby aspirins yesterday. 2 in the am.. 2 at lunch and 2 at bedtime which gave relief in the right arm from coughing so hard and long.

## 2020-11-29 NOTE — Patient Instructions (Signed)
All medications sent to your [facility] in [place]  Take prednisone 10 mg take 4 daily for 5 days then stop  Take Flonase 2 sprays each nostril daily  Stop Bronkaid's  Begin benzonatate 3 times a day for 2 days then take 3 times daily as needed for cough  Use promethazine dextromethorphan 5 mL at bedtime to help sleep may take it during the day for severe coughing spells  Stay on your albuterol and Flovent inhalers  Begin omeprazole 1 daily before meals for acid  Use sugar-free candy drop such as Marolyn Hammock rancher's to train yourself to swallow not clear your throat  Follow a reflux diet as outlined below  Return to see Dr. Joya Gaskins 6 weeks for follow-up and the cough in the clinic   Food Choices for Gastroesophageal Reflux Disease, Adult When you have gastroesophageal reflux disease (GERD), the foods you eat and your eating habits are very important. Choosing the right foods can help ease your discomfort. Think about working with a food expert (dietitian) to help you make good choices. What are tips for following this plan? Reading food labels  Look for foods that are low in saturated fat. Foods that may help with your symptoms include: ? Foods that have less than 5% of daily value (DV) of fat. ? Foods that have 0 grams of trans fat. Cooking  Do not fry your food.  Cook your food by baking, steaming, grilling, or broiling. These are all methods that do not need a lot of fat for cooking.  To add flavor, try to use herbs that are low in spice and acidity. Meal planning  Choose healthy foods that are low in fat, such as: ? Fruits and vegetables. ? Whole grains. ? Low-fat dairy products. ? Lean meats, fish, and poultry.  Eat small meals often instead of eating 3 large meals each day. Eat your meals slowly in a place where you are relaxed. Avoid bending over or lying down until 2-3 hours after eating.  Limit high-fat foods such as fatty meats or fried  foods.  Limit your intake of fatty foods, such as oils, butter, and shortening.  Avoid the following as told by your doctor: ? Foods that cause symptoms. These may be different for different people. Keep a food diary to keep track of foods that cause symptoms. ? Alcohol. ? Drinking a lot of liquid with meals. ? Eating meals during the 2-3 hours before bed.   Lifestyle  Stay at a healthy weight. Ask your doctor what weight is healthy for you. If you need to lose weight, work with your doctor to do so safely.  Exercise for at least 30 minutes on 5 or more days each week, or as told by your doctor.  Wear loose-fitting clothes.  Do not smoke or use any products that contain nicotine or tobacco. If you need help quitting, ask your doctor.  Sleep with the head of your bed higher than your feet. Use a wedge under the mattress or blocks under the bed frame to raise the head of the bed.  Chew sugar-free gum after meals. What foods should eat? Eat a healthy, well-balanced diet of fruits, vegetables, whole grains, low-fat dairy products, lean meats, fish, and poultry. Each person is different. Foods that may cause symptoms in one person may not cause any symptoms in another person. Work with your doctor to find foods that are safe for you. The items listed above may not be a complete list of what  you can eat and drink. Contact a food expert for more options.   What foods should I avoid? Limiting some of these foods may help in managing the symptoms of GERD. Everyone is different. Talk with a food expert or your doctor to help you find the exact foods to avoid, if any. Fruits Any fruits prepared with added fat. Any fruits that cause symptoms. For some people, this may include citrus fruits, such as oranges, grapefruit, pineapple, and lemons. Vegetables Deep-fried vegetables. [place] fries. Any vegetables prepared with added fat. Any vegetables that cause symptoms. For some people, this may include  tomatoes and tomato products, chili peppers, onions and garlic, and horseradish. Grains Pastries or quick breads with added fat. Meats and other proteins High-fat meats, such as fatty beef or pork, hot dogs, ribs, ham, sausage, salami, and bacon. Fried meat or protein, including fried fish and fried chicken. Nuts and nut butters, in large amounts. Dairy Whole milk and chocolate milk. Sour cream. Cream. Ice cream. Cream cheese. Milkshakes. Fats and oils Butter. Margarine. Shortening. Ghee. Beverages Coffee and tea, with or without caffeine. Carbonated beverages. Sodas. Energy drinks. Fruit juice made with acidic fruits, such as orange or grapefruit. Tomato juice. Alcoholic drinks. Sweets and desserts Chocolate and cocoa. Donuts. Seasonings and condiments Pepper. Peppermint and spearmint. Added salt. Any condiments, herbs, or seasonings that cause symptoms. For some people, this may include curry, hot sauce, or vinegar-based salad dressings. The items listed above may not be a complete list of what you should not eat and drink. Contact a food expert for more options. Questions to ask your doctor Diet and lifestyle changes are often the first steps that are taken to manage symptoms of GERD. If diet and lifestyle changes do not help, talk with your doctor about taking medicines. Where to find more information  International Foundation for Gastrointestinal Disorders: aboutgerd.org Summary  When you have GERD, food and lifestyle choices are very important in easing your symptoms.  Eat small meals often instead of 3 large meals a day. Eat your meals slowly and in a place where you are relaxed.  Avoid bending over or lying down until 2-3 hours after eating.  Limit high-fat foods such as fatty meats or fried foods. This information is not intended to replace advice given to you by your health care provider. Make sure you discuss any questions you have with your health care provider. Document  Revised: 02/01/2020 Document Reviewed: 02/01/2020 Elsevier Patient Education  [place].

## 2020-12-12 ENCOUNTER — Other Ambulatory Visit: Payer: Self-pay | Admitting: Family Medicine

## 2020-12-12 DIAGNOSIS — Z1231 Encounter for screening mammogram for malignant neoplasm of breast: Secondary | ICD-10-CM

## 2020-12-14 ENCOUNTER — Telehealth: Payer: Self-pay | Admitting: Family Medicine

## 2020-12-14 DIAGNOSIS — N6452 Nipple discharge: Secondary | ICD-10-CM

## 2020-12-14 NOTE — Telephone Encounter (Signed)
Done

## 2020-12-14 NOTE — Telephone Encounter (Signed)
Copied from [place] [id number]. Topic: General - Other >> Dec 14, 2020 10:59 AM Alanda Slim E wrote: Reason for CRM: Pt called and stated that her Right breast is leaking fluid if she doesn't keep a bra on/ the mammogram imaging office said she needs a special order since she has this issue / [address] / please advise asap

## 2020-12-26 DIAGNOSIS — G4733 Obstructive sleep apnea (adult) (pediatric): Secondary | ICD-10-CM | POA: Diagnosis not present

## 2021-01-19 ENCOUNTER — Other Ambulatory Visit: Payer: Self-pay

## 2021-01-19 ENCOUNTER — Ambulatory Visit: Payer: Medicare Other | Attending: Critical Care Medicine | Admitting: Critical Care Medicine

## 2021-01-19 ENCOUNTER — Encounter: Payer: Self-pay | Admitting: Critical Care Medicine

## 2021-01-19 VITALS — BP 116/72 | HR 62 | Ht 64.0 in | Wt 291.0 lb

## 2021-01-19 DIAGNOSIS — R0600 Dyspnea, unspecified: Secondary | ICD-10-CM

## 2021-01-19 DIAGNOSIS — J4540 Moderate persistent asthma, uncomplicated: Secondary | ICD-10-CM | POA: Diagnosis not present

## 2021-01-19 DIAGNOSIS — R059 Cough, unspecified: Secondary | ICD-10-CM | POA: Diagnosis not present

## 2021-01-19 DIAGNOSIS — K219 Gastro-esophageal reflux disease without esophagitis: Secondary | ICD-10-CM

## 2021-01-19 DIAGNOSIS — R0609 Other forms of dyspnea: Secondary | ICD-10-CM

## 2021-01-19 MED ORDER — FLUTICASONE PROPIONATE 50 MCG/ACT NA SUSP: 2.0000 | Freq: Every day | NASAL | 6 refills | Status: DC

## 2021-01-19 MED ORDER — OMEPRAZOLE 40 MG PO CPDR: 40.0000 mg | DELAYED_RELEASE_CAPSULE | Freq: Every day | ORAL | 3 refills | Status: DC

## 2021-01-19 MED ORDER — FLUTICASONE PROPIONATE HFA 110 MCG/ACT IN AERO: 2.0000 | INHALATION_SPRAY | Freq: Two times a day (BID) | RESPIRATORY_TRACT | 3 refills | Status: DC

## 2021-01-19 MED ORDER — PROMETHAZINE-DM 6.25-15 MG/5ML PO SYRP: ORAL_SOLUTION | ORAL | 0 refills | Status: DC

## 2021-01-19 MED ORDER — ALBUTEROL SULFATE HFA 108 (90 BASE) MCG/ACT IN AERS: 1.0000 | INHALATION_SPRAY | Freq: Four times a day (QID) | RESPIRATORY_TRACT | 1 refills | Status: DC | PRN

## 2021-01-19 MED ORDER — BENZONATATE 100 MG PO CAPS: ORAL_CAPSULE | ORAL | 4 refills | Status: DC

## 2021-01-19 NOTE — Progress Notes (Signed)
Subjective:    Patient ID: Cathy [name], female    DOB: [birth year], 60 y.o.   MRN: [id number]  59 y.o.F Primary care patient of Dr Margarita Rana 11/2020 Here for chronic cough of 2 months duration.  Patient had COVID test on arrival was negative.  Patient states she has had nasal congestion heartburn belching some throat irritation.  She does have sleep apnea is on a CPAP machine.  History of asthma moderate persistent on Flovent and albuterol.  On arrival blood pressure 126/89.  No other voiced complaints.  Review cough assessment below  01/19/2021 Patient presents today for a follow up visit regarding her chronic cough. The patient was evaluated on 11/2020 and during this visit reported a two month history of a chronic productive cough with white sputum. The patient was prescribed Tessalon as needed, promethazine-DM syrup, and a prednisone taper. The patient states she took all of the prednisone prescription and took the Tessalon capsules as needed which were helpful. She states the cough resolved 3-4 days after the office visit in April. The patient attributes her cough to irritation to cooking fumes from her neighbor's apartment, which she has spoken to her landlord about. She intends to find a new apartment to avoid these fumes in the future. Since April the patient has only had coughing episodes when she is occasionally exposed to second hand cigarette smoke. During these episodes the patient takes a dose of Tessalon which leads to resolution of her symptoms.   The patient reports some shortness of breath, particularly with exertion. The patient has asthma for which she is prescribed Flovent 110 2 puffs twice daily and albuterol as needed. The patient states she has only been using the Flovent inhaler 2 puffs in the morning, though she does this consistently. She also uses albuterol in about 1-3 times of day when she experiences shortness of breath. She also states her family members sometimes hear  the patient wheeze when she walks.   The patient also has a history of gastroesophageal reflux. She was prescribed omeprazole 40 mg at her last office visit which she has been taking as needed for symptoms. She states her symptoms have been improving and she has not had an episode of reflux in the last three weeks. She attributes the improvement in her symptoms to eating a healthier diet and drinking more water.   Cough This is a new problem. The current episode started more than 1 month ago. The problem has been resolved. Associated symptoms include shortness of breath. Pertinent negatives include no chest pain, chills, ear congestion, ear pain, fever, headaches, heartburn, hemoptysis, myalgias, nasal congestion, postnasal drip, rash, rhinorrhea, sore throat, sweats, weight loss or wheezing. The symptoms are aggravated by lying down, exercise, other, pollens and stress. She has tried a beta-agonist inhaler, steroid inhaler and prescription cough suppressant for the symptoms. The treatment provided significant relief. Her past medical history is significant for asthma.  Past Medical History:  Diagnosis Date   Arthritis    Back pain    Depression    Hyperlipidemia    Hypertension    Sleep apnea    cpap     Family History  Problem Relation Age of Onset   Stroke Mother    Cancer Father    Colon cancer Father    Heart disease Brother    Colon polyps Neg Hx    Esophageal cancer Neg Hx    Rectal cancer Neg Hx    Stomach cancer Neg Hx  Social History   Socioeconomic History   Marital status: Divorced    Spouse name: Not on file   Number of children: Not on file   Years of education: Not on file   Highest education level: Not on file  Occupational History   Not on file  Tobacco Use   Smoking status: Former    Pack years: 0.00    Types: Cigarettes    Quit date: 40    Years since quitting: 27.4   Smokeless tobacco: Never  Vaping Use   Vaping Use: Never used  Substance and  Sexual Activity   Alcohol use: No   Drug use: No   Sexual activity: Not on file  Other Topics Concern   Not on file  Social History Narrative   Not on file   Social Determinants of Health   Financial Resource Strain: Not on file  Food Insecurity: Not on file  Transportation Needs: Not on file  Physical Activity: Not on file  Stress: Not on file  Social Connections: Not on file  Intimate Partner Violence: Not on file     Allergies  Allergen Reactions   Isovue [Iopamidol] Tinitus    Sneezing and congestion post contrast administration, pt will require premeds for future contrasted exams     Outpatient Medications Prior to Visit  Medication Sig Dispense Refill   amLODipine (NORVASC) 5 MG tablet Take 1 tablet (5 mg total) by mouth daily. 90 tablet 1   aspirin EC 81 MG tablet Take 1 tablet (81 mg total) by mouth 2 (two) times daily. 100 tablet 1   cloNIDine (CATAPRES) 0.1 MG tablet Take 1 tablet (0.1 mg total) by mouth 2 (two) times daily. 180 tablet 1   DULoxetine (CYMBALTA) 30 MG capsule Take 1 capsule (30 mg total) by mouth daily. 90 capsule 1   meloxicam (MOBIC) 15 MG tablet Take 1 tablet (15 mg total) by mouth daily as needed for pain. 30 tablet 3   Pumpkin Seed-Soy Germ (AZO BLADDER CONTROL/GO-LESS PO) Take 1 tablet by mouth daily.     tiZANidine (ZANAFLEX) 4 MG tablet Take 1 tablet (4 mg total) by mouth every 8 (eight) hours as needed for muscle spasms. 60 tablet 1   triamcinolone (KENALOG) 0.1 % Apply 1 application topically 2 (two) times daily. 30 g 0   albuterol (VENTOLIN HFA) 108 (90 Base) MCG/ACT inhaler Inhale 1-2 puffs into the lungs every 6 (six) hours as needed for wheezing or shortness of breath. 18 g 1   benzonatate (TESSALON) 100 MG capsule Take one three times a day for two days then take one three times a day as needed 90 capsule 4   fluticasone (FLONASE) 50 MCG/ACT nasal spray Place 2 sprays into both nostrils daily. 16 g 6   fluticasone (FLOVENT HFA) 110  MCG/ACT inhaler Inhale 2 puffs into the lungs 2 (two) times daily. 12 g 1   omeprazole (PRILOSEC) 40 MG capsule Take 1 capsule (40 mg total) by mouth daily. 30 capsule 3   promethazine-dextromethorphan (PROMETHAZINE-DM) 6.25-15 MG/5ML syrup Take 5 ML at night as needed for cough , may take for severe cough in daytime 240 mL 0   atorvastatin (LIPITOR) 10 MG tablet Take 1 tablet (10 mg total) by mouth daily. 90 tablet 1   metoprolol tartrate (LOPRESSOR) 25 MG tablet Take 1 tablet (25 mg total) by mouth 2 (two) times daily. 180 tablet 1   nitroGLYCERIN (NITROSTAT) 0.4 MG SL tablet Place 1 tablet (0.4 mg total) under  the tongue every 5 (five) minutes as needed for chest pain. 25 tablet 11   predniSONE (DELTASONE) 10 MG tablet Take 4 tablets daily for 5 days then stop (Patient not taking: Reported on 01/19/2021) 20 tablet 0   No facility-administered medications prior to visit.      Review of Systems  Constitutional:  Negative for chills, fever and weight loss.  HENT:  Negative for ear pain, postnasal drip, rhinorrhea and sore throat.   Eyes:  Negative for pain.  Respiratory:  Positive for shortness of breath. Negative for cough, hemoptysis and wheezing.   Cardiovascular:  Negative for chest pain.  Gastrointestinal:  Negative for abdominal pain, diarrhea, heartburn, nausea and vomiting.  Genitourinary:  Negative for difficulty urinating and dysuria.  Musculoskeletal:  Negative for myalgias.  Skin:  Negative for rash.  Neurological:  Negative for headaches.      Objective:   Physical Exam Constitutional:      General: She is not in acute distress.    Appearance: She is obese.  HENT:     Head: Normocephalic and atraumatic.     Mouth/Throat:     Mouth: Mucous membranes are moist.     Comments: Plaque noted on teeth diffusely Eyes:     Conjunctiva/sclera: Conjunctivae normal.  Cardiovascular:     Rate and Rhythm: Normal rate and regular rhythm.     Heart sounds: Normal heart sounds. No  murmur heard.   No friction rub. No gallop.  Pulmonary:     Effort: Pulmonary effort is normal. No respiratory distress.     Breath sounds: No wheezing or rhonchi.     Comments: Distant breath sounds Abdominal:     General: There is no distension.     Palpations: Abdomen is soft.     Tenderness: There is no abdominal tenderness.  Musculoskeletal:     Cervical back: Normal range of motion.  Skin:    General: Skin is warm.  Neurological:     Mental Status: She is alert and oriented to person, place, and time.  Psychiatric:        Mood and Affect: Mood normal.        Behavior: Behavior normal.   Vitals:   01/19/21 1031  BP: 116/72  Pulse: 62  SpO2: 98%  Weight: 132 kg  Height: 5\' 4"  (1.626 m)       Assessment & Plan:  I personally reviewed all images and lab data in the [facility] system as well as any outside material available during this office visit and agree with the  radiology impressions.   Cough Symptoms have resolved. Patient can continue to take Tessalon capsules as needed and Promethazine-DM at night if symptoms reoccur. Advised to avoid triggers such as cooking fumes and second hand cigarette smoke. Follow up at next appointment.   Asthma, moderate persistent Advised to take Flovent 110 two puffs twice daily, especially during heat wave. Continue to use albuterol inhaler as needed for symptoms. Reviewed proper inhaler use technique. Follow up at next appointment  Gastroesophageal reflux Continue omeprazole 40 mg as needed. Continue healthy diet.  Follow up at next appointment    Zoriah was seen today for cough.  Diagnoses and all orders for this visit:  Dyspnea on exertion -     albuterol (VENTOLIN HFA) 108 (90 Base) MCG/ACT inhaler; Inhale 1-2 puffs into the lungs every 6 (six) hours as needed for wheezing or shortness of breath. -     fluticasone (FLOVENT HFA) 110 MCG/ACT inhaler; Inhale  2 puffs into the lungs 2 (two) times daily.  Cough  Moderate  persistent asthma without complication  Gastroesophageal reflux disease, unspecified whether esophagitis present  Other orders -     benzonatate (TESSALON) 100 MG capsule; Take one three times a day for two days then take one three times a day as needed -     fluticasone (FLONASE) 50 MCG/ACT nasal spray; Place 2 sprays into both nostrils daily. -     omeprazole (PRILOSEC) 40 MG capsule; Take 1 capsule (40 mg total) by mouth daily. -     promethazine-dextromethorphan (PROMETHAZINE-DM) 6.25-15 MG/5ML syrup; Take 5 ML at night as needed for cough , may take for severe cough in daytime I spent 25 minutes on this patient with interview reviewing chart and doing an exam formulating a medium medical decision making and complexity

## 2021-01-19 NOTE — Assessment & Plan Note (Signed)
Symptoms have resolved. Patient can continue to take Tessalon capsules as needed and Promethazine-DM at night if symptoms reoccur. Advised to avoid triggers such as cooking fumes and second hand cigarette smoke. Follow up at next appointment.

## 2021-01-19 NOTE — Assessment & Plan Note (Signed)
Continue omeprazole 40 mg as needed. Continue healthy diet.  Follow up at next appointment

## 2021-01-19 NOTE — Patient Instructions (Signed)
Refills on your medicines sent to your [facility] pharmacy  Please stay on the Flovent 2 inhalations twice daily  Stay on your Flonase nasal spray as prescribed  Stay on the omeprazole daily and continue to follow the healthy diet you are following at this time  Return to see Dr. Joya Gaskins in 4 months

## 2021-01-19 NOTE — Assessment & Plan Note (Signed)
Advised to take Flovent 110 two puffs twice daily, especially during heat wave. Continue to use albuterol inhaler as needed for symptoms. Reviewed proper inhaler use technique. Follow up at next appointment

## 2021-01-30 ENCOUNTER — Ambulatory Visit
Admission: RE | Admit: 2021-01-30 | Discharge: 2021-01-30 | Disposition: A | Discharge status: Home / Self Care | Payer: Medicare Other | Source: Ambulatory Visit | Attending: Family Medicine | Admitting: Family Medicine

## 2021-01-30 ENCOUNTER — Other Ambulatory Visit: Payer: Self-pay

## 2021-01-30 DIAGNOSIS — N6452 Nipple discharge: Secondary | ICD-10-CM

## 2021-01-30 DIAGNOSIS — R922 Inconclusive mammogram: Secondary | ICD-10-CM | POA: Diagnosis not present

## 2021-02-01 ENCOUNTER — Other Ambulatory Visit: Payer: Self-pay | Admitting: Family Medicine

## 2021-02-01 DIAGNOSIS — E785 Hyperlipidemia, unspecified: Secondary | ICD-10-CM

## 2021-02-02 NOTE — Telephone Encounter (Signed)
Requested medication (s) are due for refill today: yes   Requested medication (s) are on the active medication list:  yes   Last refill:  10/28/2020  Future visit scheduled: yes  Notes to clinic:  patient due for labs    Requested Prescriptions  Pending Prescriptions Disp Refills   atorvastatin (LIPITOR) 10 MG tablet [Pharmacy Med Name: Atorvastatin Calcium 10 MG Oral Tablet] 90 tablet 0    Sig: Take 1 tablet by mouth once daily      Cardiovascular:  Antilipid - Statins Failed - 02/01/2021  7:28 PM      Failed - Total Cholesterol in normal range and within 360 days    Cholesterol, Total  Date Value Ref Range Status  01/06/2020 154 100 - 199 mg/dL Final          Failed - LDL in normal range and within 360 days    LDL Chol Calc (NIH)  Date Value Ref Range Status  01/06/2020 74 0 - 99 mg/dL Final          Failed - HDL in normal range and within 360 days    HDL  Date Value Ref Range Status  01/06/2020 59 >39 mg/dL Final          Failed - Triglycerides in normal range and within 360 days    Triglycerides  Date Value Ref Range Status  01/06/2020 120 0 - 149 mg/dL Final          Passed - Patient is not pregnant      Passed - Valid encounter within last 12 months    Recent Outpatient Visits           2 weeks ago Cough   [place] Elsie Stain, MD   7 months ago Muscle spasm   [place], Enobong, MD   1 year ago Essential hypertension   [place], [place], [place]   1 year ago Muscle spasm   [place], Charlane Ferretti, MD   2 years ago Chronic right shoulder pain   [place], Enobong, MD       Future Appointments             In 3 months Joya Gaskins Burnett Harry, MD [place]

## 2021-02-07 ENCOUNTER — Telehealth: Payer: Self-pay

## 2021-02-07 NOTE — Telephone Encounter (Signed)
Patient is requesting a MRI for her nipple discharge. Per the mammogram results patient is needing MRI.

## 2021-02-07 NOTE — Telephone Encounter (Signed)
Please place in a virtual slot for tomorrow at 1:30 PM to discuss further. Thanks

## 2021-02-07 NOTE — Telephone Encounter (Signed)
Pt has been placed in 130 virtual slot tomorrow.

## 2021-02-08 ENCOUNTER — Ambulatory Visit: Payer: Medicare Other | Attending: Family Medicine | Admitting: Family Medicine

## 2021-02-08 ENCOUNTER — Encounter: Payer: Self-pay | Admitting: Family Medicine

## 2021-02-08 ENCOUNTER — Other Ambulatory Visit: Payer: Self-pay

## 2021-02-08 DIAGNOSIS — N6452 Nipple discharge: Secondary | ICD-10-CM

## 2021-02-08 DIAGNOSIS — Z13228 Encounter for screening for other metabolic disorders: Secondary | ICD-10-CM | POA: Diagnosis not present

## 2021-02-08 NOTE — Progress Notes (Signed)
Virtual Visit via Telephone Note  I connected with Cathy [name], on 02/08/2021 at 1:28 PM by telephone due to the COVID-19 pandemic and verified that I am speaking with the correct person using two identifiers.   Consent: I discussed the limitations, risks, security and privacy concerns of performing an evaluation and management service by telephone and the availability of in person appointments. I also discussed with the patient that there may be a patient responsible charge related to this service. The patient expressed understanding and agreed to proceed.   Location of Patient: Home  Location of Provider: Clinic   Persons participating in Telemedicine visit: [name] Dr. Margarita Rana     History of Present Illness: Cathy [name] is a 60 year old female with a history of hypertension, obstructive sleep apnea, obesity who presents today for a follow-up visit.  She is currently on CPAP for her OSA   She has had R nipple discharge which is brownish and this has been present for 2 years.  Discharge is only absent when she sleeps with her bra but if she goes to bed without a bra she has nipple discharge.  Diagnostic mammogram from 01/30/2021 revealed: RECOMMENDATION: The patient's nipple discharge is not typical for a papilloma or malignancy given its milky character. If there is continued clinical concern, a breast MRI could further evaluate. Recommend annual screening mammography.   Past Medical History:  Diagnosis Date   Arthritis    Back pain    Depression    Hyperlipidemia    Hypertension    Sleep apnea    cpap   Allergies  Allergen Reactions   Isovue [Iopamidol] Tinitus    Sneezing and congestion post contrast administration, pt will require premeds for future contrasted exams    Current Outpatient Medications on File Prior to Visit  Medication Sig Dispense Refill   atorvastatin (LIPITOR) 10 MG tablet Take 1 tablet by mouth once daily 90 tablet 0    albuterol (VENTOLIN HFA) 108 (90 Base) MCG/ACT inhaler Inhale 1-2 puffs into the lungs every 6 (six) hours as needed for wheezing or shortness of breath. 18 g 1   amLODipine (NORVASC) 5 MG tablet Take 1 tablet (5 mg total) by mouth daily. 90 tablet 1   aspirin EC 81 MG tablet Take 1 tablet (81 mg total) by mouth 2 (two) times daily. 100 tablet 1   benzonatate (TESSALON) 100 MG capsule Take one three times a day for two days then take one three times a day as needed 90 capsule 4   cloNIDine (CATAPRES) 0.1 MG tablet Take 1 tablet (0.1 mg total) by mouth 2 (two) times daily. 180 tablet 1   DULoxetine (CYMBALTA) 30 MG capsule Take 1 capsule (30 mg total) by mouth daily. 90 capsule 1   fluticasone (FLONASE) 50 MCG/ACT nasal spray Place 2 sprays into both nostrils daily. 16 g 6   fluticasone (FLOVENT HFA) 110 MCG/ACT inhaler Inhale 2 puffs into the lungs 2 (two) times daily. 12 g 3   meloxicam (MOBIC) 15 MG tablet Take 1 tablet (15 mg total) by mouth daily as needed for pain. 30 tablet 3   metoprolol tartrate (LOPRESSOR) 25 MG tablet Take 1 tablet (25 mg total) by mouth 2 (two) times daily. 180 tablet 1   nitroGLYCERIN (NITROSTAT) 0.4 MG SL tablet Place 1 tablet (0.4 mg total) under the tongue every 5 (five) minutes as needed for chest pain. 25 tablet 11   omeprazole (PRILOSEC) 40 MG capsule Take 1 capsule (40 mg  total) by mouth daily. 60 capsule 3   promethazine-dextromethorphan (PROMETHAZINE-DM) 6.25-15 MG/5ML syrup Take 5 ML at night as needed for cough , may take for severe cough in daytime 240 mL 0   Pumpkin Seed-Soy Germ (AZO BLADDER CONTROL/GO-LESS PO) Take 1 tablet by mouth daily.     tiZANidine (ZANAFLEX) 4 MG tablet Take 1 tablet (4 mg total) by mouth every 8 (eight) hours as needed for muscle spasms. 60 tablet 1   triamcinolone (KENALOG) 0.1 % Apply 1 application topically 2 (two) times daily. 30 g 0   No current facility-administered medications on file prior to visit.    ROS: See  HPI  Observations/Objective: Awake, alert, and 2x3 Not in acute distress  Assessment and Plan: 1. Nipple discharge Explained pathophysiology of nipple discharge to the patient Diagnostic mammogram and breast ultrasound have excluded malignancy We will commence with prolactin level and if elevated proceed to MRI of the brain.  If prolactin is normal consider referral to breast surgeon - Prolactin; Future  2. Screening for metabolic disorder - FQH22+VJDY; Future - Lipid panel; Future   Follow Up Instructions: For previously scheduled appointment   I discussed the assessment and treatment plan with the patient. The patient was provided an opportunity to ask questions and all were answered. The patient agreed with the plan and demonstrated an understanding of the instructions.   The patient was advised to call back or seek an in-person evaluation if the symptoms worsen or if the condition fails to improve as anticipated.     I provided 11 minutes total of non-face-to-face time during this encounter.   Charlott Rakes, MD, FAAFP. [facility] and [facility], [place]   02/08/2021, 1:28 PM

## 2021-02-09 ENCOUNTER — Ambulatory Visit: Payer: Medicare Other | Attending: Family Medicine

## 2021-02-09 ENCOUNTER — Other Ambulatory Visit: Payer: Self-pay

## 2021-02-09 DIAGNOSIS — N6452 Nipple discharge: Secondary | ICD-10-CM | POA: Diagnosis not present

## 2021-02-09 DIAGNOSIS — Z13228 Encounter for screening for other metabolic disorders: Secondary | ICD-10-CM

## 2021-02-09 DIAGNOSIS — Z13220 Encounter for screening for lipoid disorders: Secondary | ICD-10-CM | POA: Diagnosis not present

## 2021-02-10 ENCOUNTER — Other Ambulatory Visit: Payer: Self-pay | Admitting: Family Medicine

## 2021-02-10 ENCOUNTER — Telehealth: Payer: Self-pay

## 2021-02-10 DIAGNOSIS — N6452 Nipple discharge: Secondary | ICD-10-CM

## 2021-02-10 LAB — CMP14+EGFR
ALT: 29 IU/L (ref 0–32)
AST: 27 IU/L (ref 0–40)
Albumin/Globulin Ratio: 1.7 (ref 1.2–2.2)
Albumin: 4.3 g/dL (ref 3.8–4.9)
Alkaline Phosphatase: 113 IU/L (ref 44–121)
BUN/Creatinine Ratio: 14 (ref 9–23)
BUN: 14 mg/dL (ref 6–24)
Bilirubin Total: 0.4 mg/dL (ref 0.0–1.2)
CO2: 22 mmol/L (ref 20–29)
Calcium: 9.2 mg/dL (ref 8.7–10.2)
Chloride: 104 mmol/L (ref 96–106)
Creatinine, Ser: 0.99 mg/dL (ref 0.57–1.00)
Globulin, Total: 2.6 g/dL (ref 1.5–4.5)
Glucose: 90 mg/dL (ref 65–99)
Potassium: 5.2 mmol/L (ref 3.5–5.2)
Sodium: 139 mmol/L (ref 134–144)
Total Protein: 6.9 g/dL (ref 6.0–8.5)
eGFR: 67 mL/min/{1.73_m2} (ref >59)

## 2021-02-10 LAB — PROLACTIN: Prolactin: 6.1 ng/mL (ref 4.8–23.3)

## 2021-02-10 LAB — LIPID PANEL
Chol/HDL Ratio: 2.6 ratio (ref 0.0–4.4)
Cholesterol, Total: 138 mg/dL (ref 100–199)
HDL: 54 mg/dL (ref >39)
LDL Chol Calc (NIH): 70 mg/dL (ref 0–99)
Triglycerides: 71 mg/dL (ref 0–149)
VLDL Cholesterol Cal: 14 mg/dL (ref 5–40)

## 2021-02-10 NOTE — Telephone Encounter (Signed)
Patient name and DOB has been verified Patient was informed of lab results. Patient had no questions.  

## 2021-02-10 NOTE — Telephone Encounter (Signed)
-----   Message from Charlott Rakes, MD sent at 02/10/2021 10:10 AM EDT ----- Please inform her that her cholesterol is normal.  Prolactin level is also normal hence there is no indication to order a brain MRI.  I have referred her to a breast surgeon for further evaluation.

## 2021-03-13 ENCOUNTER — Other Ambulatory Visit: Payer: Self-pay | Admitting: Surgery

## 2021-03-13 DIAGNOSIS — N6452 Nipple discharge: Secondary | ICD-10-CM | POA: Diagnosis not present

## 2021-03-24 ENCOUNTER — Other Ambulatory Visit: Payer: Self-pay | Admitting: Family Medicine

## 2021-03-25 ENCOUNTER — Ambulatory Visit
Admission: RE | Admit: 2021-03-25 | Discharge: 2021-03-25 | Disposition: A | Discharge status: Home / Self Care | Payer: Medicare Other | Source: Ambulatory Visit | Attending: Surgery | Admitting: Surgery

## 2021-03-25 DIAGNOSIS — N6452 Nipple discharge: Secondary | ICD-10-CM | POA: Diagnosis not present

## 2021-03-25 MED ORDER — GADOBUTROL 1 MMOL/ML IV SOLN
10.0000 mL | Freq: Once | INTRAVENOUS | Status: AC | PRN
  Administered 2021-03-25: 10 mL via INTRAVENOUS

## 2021-03-27 DIAGNOSIS — G4733 Obstructive sleep apnea (adult) (pediatric): Secondary | ICD-10-CM | POA: Diagnosis not present

## 2021-03-29 NOTE — Progress Notes (Signed)
Please call the patient and let them know that their MRI did not show any masses concerning for any cancers.  At this time, if the drainage persists, we need to do a nipple duct excision.  We can post her or she can come in to talk about it before we post her.

## 2021-04-22 ENCOUNTER — Ambulatory Visit (HOSPITAL_BASED_OUTPATIENT_CLINIC_OR_DEPARTMENT_OTHER): Payer: Medicare Other

## 2021-04-22 DIAGNOSIS — Z0000 Encounter for general adult medical examination without abnormal findings: Secondary | ICD-10-CM

## 2021-04-22 DIAGNOSIS — Z23 Encounter for immunization: Secondary | ICD-10-CM | POA: Diagnosis not present

## 2021-04-22 NOTE — Progress Notes (Signed)
I connected with  Cathy [name] on 04/22/21 by a audio enabled telemedicine application and verified that I am speaking with the correct person using two identifiers.   I discussed the limitations of evaluation and management by telemedicine. The patient expressed understanding and agreed to proceed.  Location of patient: Home Location of provider: Office  Person participating: Cathy [name](patient), Tamala Julian, CMA   Subjective:   Cathy [name] is a 60 y.o. female who presents for an Initial Medicare Annual Wellness Visit. I connected with  Cathy [name] on 04/22/21 by a audio enabled telemedicine application and verified that I am speaking with the correct person using two identifiers.   I discussed the limitations of evaluation and management by telemedicine. The patient expressed understanding and agreed to proceed.   Review of Systems    Defer to PCP       Objective:    Today's Vitals   04/22/21 0844  PainSc: 10-Worst pain ever   There is no height or weight on file to calculate BMI.  Advanced Directives 03/04/2018 10/30/2017 07/16/2017 05/20/2017 05/18/2017 04/19/2017 04/15/2017  Does Patient Have a Medical Advance Directive? No No No No No No No  Would patient like information on creating a medical advance directive? No - Patient declined No - Patient declined - No - Patient declined - - -    Current Medications (verified) Outpatient Encounter Medications as of 04/22/2021  Medication Sig   amLODipine (NORVASC) 5 MG tablet Take 1 tablet (5 mg total) by mouth daily.   aspirin EC 81 MG tablet Take 1 tablet (81 mg total) by mouth 2 (two) times daily.   atorvastatin (LIPITOR) 10 MG tablet Take 1 tablet by mouth once daily   cloNIDine (CATAPRES) 0.1 MG tablet Take 1 tablet (0.1 mg total) by mouth 2 (two) times daily.   DULoxetine (CYMBALTA) 30 MG capsule Take 1 capsule (30 mg total) by mouth daily.   fluticasone (FLONASE) 50 MCG/ACT nasal spray Place 2 sprays into both  nostrils daily.   fluticasone (FLOVENT HFA) 110 MCG/ACT inhaler Inhale 2 puffs into the lungs 2 (two) times daily.   omeprazole (PRILOSEC) 40 MG capsule Take 1 capsule (40 mg total) by mouth daily.   albuterol (VENTOLIN HFA) 108 (90 Base) MCG/ACT inhaler Inhale 1-2 puffs into the lungs every 6 (six) hours as needed for wheezing or shortness of breath.   benzonatate (TESSALON) 100 MG capsule Take one three times a day for two days then take one three times a day as needed   meloxicam (MOBIC) 15 MG tablet Take 1 tablet (15 mg total) by mouth daily as needed for pain. (Patient not taking: Reported on 04/22/2021)   metoprolol tartrate (LOPRESSOR) 25 MG tablet Take 1 tablet (25 mg total) by mouth 2 (two) times daily.   nitroGLYCERIN (NITROSTAT) 0.4 MG SL tablet Place 1 tablet (0.4 mg total) under the tongue every 5 (five) minutes as needed for chest pain.   promethazine-dextromethorphan (PROMETHAZINE-DM) 6.25-15 MG/5ML syrup Take 5 ML at night as needed for cough , may take for severe cough in daytime   Pumpkin Seed-Soy Germ (AZO BLADDER CONTROL/GO-LESS PO) Take 1 tablet by mouth daily. (Patient not taking: Reported on 04/22/2021)   tiZANidine (ZANAFLEX) 4 MG tablet Take 1 tablet (4 mg total) by mouth every 8 (eight) hours as needed for muscle spasms. (Patient not taking: Reported on 04/22/2021)   triamcinolone (KENALOG) 0.1 % Apply 1 application topically 2 (two) times daily. (Patient not taking: Reported on 04/22/2021)  No facility-administered encounter medications on file as of 04/22/2021.    Allergies (verified) Isovue [iopamidol]   History: Past Medical History:  Diagnosis Date   Arthritis    Back pain    Depression    Hyperlipidemia    Hypertension    Sleep apnea    cpap   Past Surgical History:  Procedure Laterality Date   [place]   states constipated since then   KNEE SURGERY     LEFT HEART CATH AND CORONARY ANGIOGRAPHY N/A 05/20/2017    Procedure: LEFT HEART CATH AND CORONARY ANGIOGRAPHY;  Surgeon: Leonie Man, MD;  Location: [facility];  Service: Cardiovascular;  Laterality: N/A;   Family History  Problem Relation Age of Onset   Stroke Mother    Cancer Father    Colon cancer Father    Heart disease Brother    Colon polyps Neg Hx    Esophageal cancer Neg Hx    Rectal cancer Neg Hx    Stomach cancer Neg Hx    Social History   Socioeconomic History   Marital status: Divorced    Spouse name: Not on file   Number of children: Not on file   Years of education: Not on file   Highest education level: Not on file  Occupational History   Not on file  Tobacco Use   Smoking status: Former    Types: Cigarettes    Quit date: 1995    Years since quitting: 27.7   Smokeless tobacco: Never  Vaping Use   Vaping Use: Never used  Substance and Sexual Activity   Alcohol use: No   Drug use: No   Sexual activity: Not Currently  Other Topics Concern   Not on file  Social History Narrative   Not on file   Social Determinants of Health   Financial Resource Strain: Low Risk    Difficulty of Paying Living Expenses: Not very hard  Food Insecurity: No Food Insecurity   Worried About Charity fundraiser in the Last Year: Never true   Ran Out of Food in the Last Year: Never true  Transportation Needs: No Transportation Needs   Lack of Transportation (Medical): No   Lack of Transportation (Non-Medical): No  Physical Activity: Inactive   Days of Exercise per Week: 0 days   Minutes of Exercise per Session: 0 min  Stress: Stress Concern Present   Feeling of Stress : To some extent  Social Connections: Socially Isolated   Frequency of Communication with Friends and Family: More than three times a week   Frequency of Social Gatherings with Friends and Family: Once a week   Attends Religious Services: Never   Marine scientist or Organizations: No   Attends Music therapist: Never   Marital  Status: Divorced    Tobacco Counseling Counseling given: Not Answered   Clinical Intake:     Pain : 0-10 Pain Score: 10-Worst pain ever Pain Type: Acute pain Pain Location: Foot Pain Orientation: Right Pain Descriptors / Indicators: Spasm, Tender Pain Onset: In the past 7 days Pain Frequency: Constant Pain Relieving Factors: Try to elevate and swelling did not go down  Pain Relieving Factors: Try to elevate and swelling did not go down  Nutritional Risks: Unintentional weight loss Diabetes: No  How often do you need to have someone help you when you read instructions, pamphlets, or other written materials from your doctor or  pharmacy?: 4 - Often What is the last grade level you completed in school?: Son and daughter helps daily with instructions in taking medication  Diabetic?No  Interpreter Needed?: No      Activities of Daily Living No flowsheet data found.  Patient Care Team: Charlott Rakes, MD as PCP - General (Family Medicine)  Indicate any recent Medical Services you may have received from other than [facility] providers in the past year (date may be approximate).     Assessment:   This is a routine wellness examination for Cathy [name].  Hearing/Vision screen No results found.  Dietary issues and exercise activities discussed:     Goals Addressed   None    Depression Screen PHQ 2/9 Scores 04/22/2021 01/19/2021 11/29/2020 07/07/2020 01/06/2020 06/30/2019 06/16/2018  PHQ - 2 Score '1 3 2 2 2 2 4  '$ PHQ- 9 Score - '11 17 11 15 5 18    '$ Fall Risk Fall Risk  01/19/2021 07/07/2020 01/06/2020 06/30/2019 01/05/2019  Falls in the past year? 0 0 0 1 0  Number falls in past yr: 0 0 - 0 -  Injury with Fall? 0 0 - 1 -  Risk for fall due to : - - History of fall(s) History of fall(s) -  Follow up - - - Falls evaluation completed -    FALL RISK PREVENTION PERTAINING TO THE HOME:  Any stairs in or around the home? No  If so, are there any without handrails? No  Home free of  loose throw rugs in walkways, pet beds, electrical cords, etc? Yes  Adequate lighting in your home to reduce risk of falls? Yes   ASSISTIVE DEVICES UTILIZED TO PREVENT FALLS:  Life alert? No  Use of a cane, walker or w/c? Yes  Grab bars in the bathroom? Yes  Shower chair or bench in shower? No  Elevated toilet seat or a handicapped toilet? No   TIMED UP AND GO:  Was the test performed? N/A.  Length of time to ambulate 10 feet: N/A sec.   Gait slow and steady with assistive device  Cognitive Function:        Immunizations Immunization History  Administered Date(s) Administered   PFIZER(Purple Top)SARS-COV-2 Vaccination 10/30/2019, 11/24/2019   Tdap 11/12/2017    TDAP status: Up to date  Flu Vaccine status: Declined, Education has been provided regarding the importance of this vaccine but patient still declined. Advised may receive this vaccine at local pharmacy or Health Dept. Aware to provide a copy of the vaccination record if obtained from local pharmacy or Health Dept. Verbalized acceptance and understanding.  Pneumococcal vaccine status: Declined,  Education has been provided regarding the importance of this vaccine but patient still declined. Advised may receive this vaccine at local pharmacy or Health Dept. Aware to provide a copy of the vaccination record if obtained from local pharmacy or Health Dept. Verbalized acceptance and understanding.   Covid-19 vaccine status: Completed vaccines  Qualifies for Shingles Vaccine? Yes   Zostavax completed No   Shingrix Completed?: No.    Education has been provided regarding the importance of this vaccine. Patient has been advised to call insurance company to determine out of pocket expense if they have not yet received this vaccine. Advised may also receive vaccine at local pharmacy or Health Dept. Verbalized acceptance and understanding.  Screening Tests Health Maintenance  Topic Date Due   Zoster Vaccines- Shingrix (1 of  2) Never done   INFLUENZA VACCINE  Never done   PAP SMEAR-Modifier  03/28/2021   COVID-19 Vaccine (3 - Booster for Pfizer series) 05/08/2021 (Originally 04/25/2020)   COLONOSCOPY (Pts 45-47yr Insurance coverage will need to be confirmed)  10/12/2021   MAMMOGRAM  01/31/2023   TETANUS/TDAP  11/13/2027   Hepatitis C Screening  Completed   HIV Screening  Completed   HPV VACCINES  Aged Out    Health Maintenance  Health Maintenance Due  Topic Date Due   Zoster Vaccines- Shingrix (1 of 2) Never done   INFLUENZA VACCINE  Never done   PAP SMEAR-Modifier  03/28/2021    Colorectal cancer screening: Type of screening: Colonoscopy. Completed 10/13/18. Repeat every 3 years  Mammogram status: Completed 01/30/21. Repeat every year    Lung Cancer Screening: (Low Dose CT Chest recommended if Age 260-80years, 30 pack-year currently smoking OR have quit w/in 15years.) does not qualify.   Lung Cancer Screening Referral: N/A  Additional Screening:  Hepatitis C Screening: does not qualify; Completed 02/17/18  Vision Screening: Recommended annual ophthalmology exams for early detection of glaucoma and other disorders of the eye. Is the patient up to date with their annual eye exam?  Yes  Who is the provider or what is the name of the office in which the patient attends annual eye exams? Did not remember date If pt is not established with a provider, would they like to be referred to a provider to establish care? No .   Dental Screening: Recommended annual dental exams for proper oral hygiene  Community Resource Referral / Chronic Care Management: CRR required this visit?  No   CCM required this visit?  No      Plan:     I have personally reviewed and noted the following in the patient's chart:   Medical and social history Use of alcohol, tobacco or illicit drugs  Current medications and supplements including opioid prescriptions. Patient is not currently taking opioid  prescriptions. Functional ability and status Nutritional status Physical activity Advanced directives List of other physicians Hospitalizations, surgeries, and ER visits in previous 12 months Vitals Screenings to include cognitive, depression, and falls Referrals and appointments  In addition, I have reviewed and discussed with patient certain preventive protocols, quality metrics, and best practice recommendations. A written personalized care plan for preventive services as well as general preventive health recommendations were provided to patient.     [place]  04/22/2021   Nurse Notes: Non face to face 45 minutes encounter   Cathy [name] , Thank you for taking time to come for your Medicare Wellness Visit. I appreciate your ongoing commitment to your health goals. Please review the following plan we discussed and let me know if I can assist you in the future.   These are the goals we discussed:  Goals      Blood Pressure < 140/90        This is a list of the screening recommended for you and due dates:  Health Maintenance  Topic Date Due   Zoster (Shingles) Vaccine (1 of 2) Never done   Flu Shot  Never done   Pap Smear  03/28/2021   COVID-19 Vaccine (3 - Booster for Pfizer series) 05/08/2021*   Colon Cancer Screening  10/12/2021   Mammogram  01/31/2023   Tetanus Vaccine  11/13/2027   Hepatitis C Screening: USPSTF Recommendation to screen - Ages 18-79 yo.  Completed   HIV Screening  Completed   HPV Vaccine  Aged Out  *Topic was postponed. The date shown is  not the original due date.

## 2021-04-22 NOTE — Patient Instructions (Signed)
Health Maintenance, Female Adopting a healthy lifestyle and getting preventive care are important in promoting health and wellness. Ask your health care provider about: The right schedule for you to have regular tests and exams. Things you can do on your own to prevent diseases and keep yourself healthy. What should I know about diet, weight, and exercise? Eat a healthy diet  Eat a diet that includes plenty of vegetables, fruits, low-fat dairy products, and lean protein. Do not eat a lot of foods that are high in solid fats, added sugars, or sodium. Maintain a healthy weight Body mass index (BMI) is used to identify weight problems. It estimates body fat based on height and weight. Your health care provider can help determine your BMI and help you achieve or maintain a healthy weight. Get regular exercise Get regular exercise. This is one of the most important things you can do for your health. Most adults should: Exercise for at least 150 minutes each week. The exercise should increase your heart rate and make you sweat (moderate-intensity exercise). Do strengthening exercises at least twice a week. This is in addition to the moderate-intensity exercise. Spend less time sitting. Even light physical activity can be beneficial. Watch cholesterol and blood lipids Have your blood tested for lipids and cholesterol at 58 years of age, then have this test every 5 years. Have your cholesterol levels checked more often if: Your lipid or cholesterol levels are high. You are older than 58 years of age. You are at high risk for heart disease. What should I know about cancer screening? Depending on your health history and family history, you may need to have cancer screening at various ages. This may include screening for: Breast cancer. Cervical cancer. Colorectal cancer. Skin cancer. Lung cancer. What should I know about heart disease, diabetes, and high blood pressure? Blood pressure and heart  disease High blood pressure causes heart disease and increases the risk of stroke. This is more likely to develop in people who have high blood pressure readings, are of African descent, or are overweight. Have your blood pressure checked: Every 3-5 years if you are 18-39 years of age. Every year if you are 40 years old or older. Diabetes Have regular diabetes screenings. This checks your fasting blood sugar level. Have the screening done: Once every three years after age 40 if you are at a normal weight and have a low risk for diabetes. More often and at a younger age if you are overweight or have a high risk for diabetes. What should I know about preventing infection? Hepatitis B If you have a higher risk for hepatitis B, you should be screened for this virus. Talk with your health care provider to find out if you are at risk for hepatitis B infection. Hepatitis C Testing is recommended for: Everyone born from 1945 through 1965. Anyone with known risk factors for hepatitis C. Sexually transmitted infections (STIs) Get screened for STIs, including gonorrhea and chlamydia, if: You are sexually active and are younger than 58 years of age. You are older than 58 years of age and your health care provider tells you that you are at risk for this type of infection. Your sexual activity has changed since you were last screened, and you are at increased risk for chlamydia or gonorrhea. Ask your health care provider if you are at risk. Ask your health care provider about whether you are at high risk for HIV. Your health care provider may recommend a prescription medicine   to help prevent HIV infection. If you choose to take medicine to prevent HIV, you should first get tested for HIV. You should then be tested every 3 months for as long as you are taking the medicine. Pregnancy If you are about to stop having your period (premenopausal) and you may become pregnant, seek counseling before you get  pregnant. Take 400 to 800 micrograms (mcg) of folic acid every day if you become pregnant. Ask for birth control (contraception) if you want to prevent pregnancy. Osteoporosis and menopause Osteoporosis is a disease in which the bones lose minerals and strength with aging. This can result in bone fractures. If you are 65 years old or older, or if you are at risk for osteoporosis and fractures, ask your health care provider if you should: Be screened for bone loss. Take a calcium or vitamin D supplement to lower your risk of fractures. Be given hormone replacement therapy (HRT) to treat symptoms of menopause. Follow these instructions at home: Lifestyle Do not use any products that contain nicotine or tobacco, such as cigarettes, e-cigarettes, and chewing tobacco. If you need help quitting, ask your health care provider. Do not use street drugs. Do not share needles. Ask your health care provider for help if you need support or information about quitting drugs. Alcohol use Do not drink alcohol if: Your health care provider tells you not to drink. You are pregnant, may be pregnant, or are planning to become pregnant. If you drink alcohol: Limit how much you use to 0-1 drink a day. Limit intake if you are breastfeeding. Be aware of how much alcohol is in your drink. In the [place] one drink equals one 12 oz bottle of beer (355 mL), one 5 oz glass of wine (148 mL), or one 1 oz glass of hard liquor (44 mL). General instructions Schedule regular health, dental, and eye exams. Stay current with your vaccines. Tell your health care provider if: You often feel depressed. You have ever been abused or do not feel safe at home. Summary Adopting a healthy lifestyle and getting preventive care are important in promoting health and wellness. Follow your health care provider's instructions about healthy diet, exercising, and getting tested or screened for diseases. Follow your health care provider's  instructions on monitoring your cholesterol and blood pressure. This information is not intended to replace advice given to you by your health care provider. Make sure you discuss any questions you have with your health care provider. Document Revised: 09/30/2020 Document Reviewed: 07/16/2018 Elsevier Patient Education  2022 [employer] Inc.  

## 2021-04-24 ENCOUNTER — Ambulatory Visit (INDEPENDENT_AMBULATORY_CARE_PROVIDER_SITE_OTHER): Payer: Medicare Other

## 2021-04-24 ENCOUNTER — Ambulatory Visit (HOSPITAL_COMMUNITY)
Admission: EM | Admit: 2021-04-24 | Discharge: 2021-04-24 | Disposition: A | Discharge status: Home / Self Care | Payer: Medicare Other | Attending: Sports Medicine | Admitting: Sports Medicine

## 2021-04-24 ENCOUNTER — Encounter (HOSPITAL_COMMUNITY): Payer: Self-pay | Admitting: Emergency Medicine

## 2021-04-24 ENCOUNTER — Other Ambulatory Visit: Payer: Self-pay

## 2021-04-24 DIAGNOSIS — M79671 Pain in right foot: Secondary | ICD-10-CM | POA: Diagnosis not present

## 2021-04-24 DIAGNOSIS — R609 Edema, unspecified: Secondary | ICD-10-CM | POA: Diagnosis not present

## 2021-04-24 DIAGNOSIS — R600 Localized edema: Secondary | ICD-10-CM | POA: Diagnosis not present

## 2021-04-24 DIAGNOSIS — M79672 Pain in left foot: Secondary | ICD-10-CM | POA: Diagnosis not present

## 2021-04-24 MED ORDER — CELECOXIB 100 MG PO CAPS: 100.0000 mg | ORAL_CAPSULE | Freq: Two times a day (BID) | ORAL | 0 refills | Status: DC

## 2021-04-24 NOTE — Discharge Instructions (Signed)
-   Ice over painful area and foot at least 15-20 minutes 3 times daily -May keep foot elevated to help with the swelling - Celebrex '100mg'$  twice a day scheduled for the next week taken with food, then as needed from there - You may wear ankle brace for support  - If pain worsens or is not improved at 2-weeks, call orthopedics for follow-up above

## 2021-04-24 NOTE — ED Provider Notes (Signed)
[facility]    CSN: [id number] Arrival date & time: 04/24/21  1505      History   Chief Complaint Chief Complaint  Patient presents with   Foot Pain    HPI Cathy [name] is a 60 y.o. female here for evaluation of right mid-foot pain.  HPI  Patient reports right midfoot pain since Thursday night/Friday morning.  She states that Thursday during the day she was stepping out of her truck to the side and somewhat backward and felt a pain in her midfoot she stumbled backward but did catch her self.  She denies any twisting of the ankle that she can remember.  She was able to walk after this incident, however the pain worsened later that night and the following Friday morning.  Has any previous injury to this foot in the past.  She states that yesterday, her 46-year-old grandson did step on the dorsum of her midfoot and this exacerbated her pain.  She does have pain with full weightbearing up onto the ball of her foot as well as inversion of the ankle.  She does note some swelling of the dorsum of the right foot, although denies any erythema, ecchymosis.  She has been elevating the foot for pain relief, however has not done any ice/heat or taking any analgesic medication. Does report a history of gout in the right big toe, but this has not been an issue for a few years.  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      Home Medications    Prior to Admission medications   Medication Sig Start Date End Date Taking? Authorizing Provider  albuterol (VENTOLIN HFA) 108 (90 Base) MCG/ACT inhaler Inhale 1-2 puffs into the lungs every 6 (six) hours as needed for wheezing or shortness of breath. 01/19/21  Yes Elsie Stain, MD  amLODipine (NORVASC) 5 MG tablet Take 1 tablet (5 mg total) by mouth daily. 07/07/20  Yes Charlott Rakes, MD  aspirin EC 81 MG tablet Take 1 tablet (81 mg total) by mouth 2 (two) times daily. 01/06/20  Yes Freeman Caldron M, PA-C  atorvastatin (LIPITOR) 10 MG tablet Take 1 tablet by mouth once daily 02/02/21  Yes Newlin, Enobong, MD  celecoxib (CELEBREX) 100 MG capsule Take 1 capsule (100 mg total) by mouth 2 (two) times daily. 04/24/21  Yes Elba Barman, DO  cloNIDine (CATAPRES) 0.1 MG tablet Take 1 tablet (0.1 mg total) by mouth 2 (two) times daily. 07/07/20  Yes Charlott Rakes, MD  DULoxetine (CYMBALTA) 30 MG capsule Take 1 capsule (30 mg total) by mouth daily. 07/07/20  Yes Charlott Rakes, MD  fluticasone (FLOVENT HFA) 110 MCG/ACT inhaler Inhale 2 puffs into the lungs 2 (two) times daily. 01/19/21  Yes Elsie Stain, MD  benzonatate (TESSALON) 100 MG capsule Take one three times a day for two days then take one three times a day as needed 01/19/21   Elsie Stain, MD  fluticasone [facility]) 50 MCG/ACT nasal spray Place 2 sprays into both nostrils daily. 01/19/21   Elsie Stain, MD  meloxicam (MOBIC) 15 MG tablet Take 1 tablet  (15 mg total) by mouth daily as needed for pain. Patient not taking: Reported on 04/22/2021 01/06/20   Argentina Donovan, PA-C  metoprolol tartrate (LOPRESSOR) 25 MG tablet Take 1 tablet (25 mg total) by mouth 2 (two) times daily. 07/07/20 10/05/20  Charlott Rakes, MD  nitroGLYCERIN (NITROSTAT) 0.4 MG SL tablet Place 1 tablet (0.4 mg total) under the tongue every 5 (five) minutes as needed for chest pain. 05/20/17 10/03/18  Park Liter, MD  omeprazole (PRILOSEC) 40 MG capsule Take 1 capsule (40 mg total) by mouth daily. 01/19/21   Elsie Stain, MD  promethazine-dextromethorphan (PROMETHAZINE-DM) 6.25-15 MG/5ML syrup Take 5 ML at night as needed for cough , may take for severe cough in daytime 01/19/21   Elsie Stain, MD  Pumpkin Seed-Soy Germ (AZO BLADDER CONTROL/GO-LESS PO) Take 1 tablet by mouth daily. Patient not taking: Reported on 04/22/2021    [provider]  tiZANidine (ZANAFLEX) 4 MG tablet Take 1 tablet (4 mg total) by mouth every 8 (eight) hours as needed for muscle spasms. Patient not taking: Reported on 04/22/2021 07/07/20   Charlott Rakes, MD  triamcinolone (KENALOG) 0.1 % Apply 1 application topically 2 (two) times daily. Patient not taking: Reported on 04/22/2021 07/07/20   Charlott Rakes, MD    Family History Family History  Problem Relation Age of Onset   Stroke Mother    Cancer Father    Colon cancer Father    Heart disease Brother    Colon polyps Neg Hx    Esophageal cancer Neg Hx    Rectal cancer Neg Hx    Stomach cancer Neg Hx     Social History Social History   Tobacco Use   Smoking status: Former    Types: Cigarettes    Quit date: 1995    Years since quitting: 27.7   Smokeless tobacco: Never  Vaping Use   Vaping Use: Never used  Substance Use Topics   Alcohol use: No   Drug use: No     Allergies   Isovue [iopamidol]   Review of Systems Review of Systems  Constitutional:  Negative for activity change, chills and fever.   Musculoskeletal:  Positive for arthralgias (right midfoot pain) and joint swelling (foot, dorsum).  Skin:  Negative for color change, pallor and wound.  Neurological:  Negative for weakness.    Physical Exam Triage Vital Signs ED Triage Vitals  Enc Vitals Group     BP 04/24/21 1637 (!) 146/83     Pulse Rate 04/24/21 1637 82     Resp 04/24/21 1637 20     Temp 04/24/21 1637 99.2 F (37.3 C)     Temp Source 04/24/21 1637 Oral     SpO2 04/24/21 1637 99 %  Weight --      Height --      Head Circumference --      Peak Flow --      Pain Score 04/24/21 1633 10     Pain Loc --      Pain Edu? --      Excl. in [place]? --    No data found.  Updated Vital Signs BP (!) 146/83 (BP Location: Left Arm) Comment (BP Location): large cuff  Pulse 82   Temp 99.2 F (37.3 C) (Oral)   Resp 20   SpO2 99%   Visual Acuity Right Eye Distance:   Left Eye Distance:   Bilateral Distance:    Right Eye Near:   Left Eye Near:    Bilateral Near:     Physical Exam Gen: Well-appearing, in no acute distress; non-toxic CV: Regular Rate. Well-perfused. Warm.  Resp: Breathing unlabored on room air; no wheezing. Psych: Fluid speech in conversation; appropriate affect; normal thought process Neuro: Sensation intact throughout. No gross coordination deficits.  MSK:  - Right foot: + TTP over dorsum of right mid-foot near cuneiforms/metatarsal proximal head.  There is trace pedal edema of the dorsum of the foot.  No erythema, ecchymosis, or warmth over this area.  No TTP over the navicular, base of the fifth metatarsal, or or bilateral malleoli.  Range of motion is full in all directions without significant pain, although some pain with inversion stress testing.  Transverse arch loss, moderate pes planus bilaterally.  Strength is 5/5 in all directions.  Negative anterior drawer.  She is able to walk 4 steps without significant antalgic gait.    UC Treatments / Results  Labs (all labs ordered are listed,  but only abnormal results are displayed) Labs Reviewed - No data to display  EKG   Radiology DG Foot Complete Right  Result Date: 04/24/2021 CLINICAL DATA:  Pain in dorsal midfoot. Landed wrong stepping out of truck. Lateral foot pain. EXAM: RIGHT FOOT COMPLETE - 3+ VIEW COMPARISON:  None. FINDINGS: There is no evidence of fracture or dislocation. Mild hallux valgus and degenerative change of the first metatarsal phalangeal joint. There is also an erosion involving the medial aspect of the first metatarsal head with overhanging edges. No other erosions. Slight hammertoe deformity of the third through fifth digits. Moderate plantar calcaneal spur. There is dorsal soft tissue edema. IMPRESSION: 1. No fracture or subluxation of the right foot. 2. Dorsal soft tissue edema. 3. Erosion involving the medial aspect of the first metatarsal head with overhanging edges, suggesting gout. Electronically Signed   By: Keith Rake M.D.   On: 04/24/2021 18:02    Procedures Procedures (including critical care time)  Medications Ordered in UC Medications - No data to display  Initial Impression / Assessment and Plan / UC Course  I have reviewed the triage vital signs and the nursing notes.  Pertinent labs & imaging results that were available during my care of the patient were reviewed by me and considered in my medical decision making (see chart for details).     Pain in right midfoot - pain x 4 days. Initial pain when stepping out of truck however was exacerbated when grandson stepped on dorsum of right foot. She is able to weightbear without much pain. Pain worse with inversion stress testing and direct palpation. Most likely foot contusion vs tendon sprain. X-rays did not show any bony abnormality or fracture. I do not see any difference in Lis-franc joint compared to contralateral  foot although full weight-bearing radiographs not possible here at UC. If this were Lis-franc injury I would expect more  pain with ambulation or weight-bearing, however cannot rule out.  We will treat her with RICE therapy.  Prescription for Celebrex 100 mg to be taken twice daily x7 days, then as needed.  Patient did have some pain with inversion, so per her request we did fit her for a lace up ankle brace today for support.  I provided her information for orthopedics, she is to follow-up with them if her pain continues or does not get better.  Patient agreeable and understanding.  Safe for discharge home. Final Clinical Impressions(s) / UC Diagnoses   Final diagnoses:  Pain of right midfoot     Discharge Instructions      - Ice over painful area and foot at least 15-20 minutes 3 times daily -May keep foot elevated to help with the swelling - Celebrex '100mg'$  twice a day scheduled for the next week taken with food, then as needed from there - You may wear ankle brace for support  - If pain worsens or is not improved at 2-weeks, call orthopedics for follow-up above     ED Prescriptions     Medication Sig Dispense Auth. Provider   celecoxib (CELEBREX) 100 MG capsule Take 1 capsule (100 mg total) by mouth 2 (two) times daily. 42 capsule Elba Barman, DO      PDMP not reviewed this encounter.   Elba Barman, DO 04/24/21 1826

## 2021-04-24 NOTE — ED Triage Notes (Signed)
Patient reports right foot pain for 3-4 days.  Patient is not certain , but thinks climbing in and out of a truck that is higher off the ground than she is use to may have started pain in right foot.  Last night a grand child stepped on foot and pain worsened.  Patient has 2 + pulses. Patient has had right ankle and foot wrapped with an ace wrap on arrival

## 2021-05-01 DIAGNOSIS — N6452 Nipple discharge: Secondary | ICD-10-CM | POA: Diagnosis not present

## 2021-05-21 NOTE — Progress Notes (Deleted)
Established Patient Office Visit  Subjective:  Patient ID: Cathy [name], female    DOB: [birth year]  Age: 60 y.o. MRN: [id number]  CC: cough f/u  HPI Cathy [name] presents for   Past Medical History:  Diagnosis Date   Arthritis    Back pain    Depression    Hyperlipidemia    Hypertension    Sleep apnea    cpap    Past Surgical History:  Procedure Laterality Date   [place]   states constipated since then   KNEE SURGERY     LEFT HEART CATH AND CORONARY ANGIOGRAPHY N/A 05/20/2017   Procedure: LEFT HEART CATH AND CORONARY ANGIOGRAPHY;  Surgeon: Leonie Man, MD;  Location: [facility];  Service: Cardiovascular;  Laterality: N/A;    Family History  Problem Relation Age of Onset   Stroke Mother    Cancer Father    Colon cancer Father    Heart disease Brother    Colon polyps Neg Hx    Esophageal cancer Neg Hx    Rectal cancer Neg Hx    Stomach cancer Neg Hx     Social History   Socioeconomic History   Marital status: Divorced    Spouse name: Not on file   Number of children: Not on file   Years of education: Not on file   Highest education level: Not on file  Occupational History   Not on file  Tobacco Use   Smoking status: Former    Types: Cigarettes    Quit date: 1995    Years since quitting: 27.8   Smokeless tobacco: Never  Vaping Use   Vaping Use: Never used  Substance and Sexual Activity   Alcohol use: No   Drug use: No   Sexual activity: Not Currently  Other Topics Concern   Not on file  Social History Narrative   Not on file   Social Determinants of Health   Financial Resource Strain: Low Risk    Difficulty of Paying Living Expenses: Not very hard  Food Insecurity: No Food Insecurity   Worried About Charity fundraiser in the Last Year: Never true   Ran Out of Food in the Last Year: Never true  Transportation Needs: No Transportation Needs   Lack of Transportation (Medical): No    Lack of Transportation (Non-Medical): No  Physical Activity: Inactive   Days of Exercise per Week: 0 days   Minutes of Exercise per Session: 0 min  Stress: Stress Concern Present   Feeling of Stress : To some extent  Social Connections: Socially Isolated   Frequency of Communication with Friends and Family: More than three times a week   Frequency of Social Gatherings with Friends and Family: Once a week   Attends Religious Services: Never   Marine scientist or Organizations: No   Attends Music therapist: Never   Marital Status: Divorced  Human resources officer Violence: Not At Risk   Fear of Current or Ex-Partner: No   Emotionally Abused: No   Physically Abused: No   Sexually Abused: No    Outpatient Medications Prior to Visit  Medication Sig Dispense Refill   albuterol (VENTOLIN HFA) 108 (90 Base) MCG/ACT inhaler Inhale 1-2 puffs into the lungs every 6 (six) hours as needed for wheezing or shortness of breath. 18 g 1   amLODipine (NORVASC) 5 MG tablet Take 1 tablet (5 mg total) by mouth  daily. 90 tablet 1   aspirin EC 81 MG tablet Take 1 tablet (81 mg total) by mouth 2 (two) times daily. 100 tablet 1   atorvastatin (LIPITOR) 10 MG tablet Take 1 tablet by mouth once daily 90 tablet 0   benzonatate (TESSALON) 100 MG capsule Take one three times a day for two days then take one three times a day as needed 90 capsule 4   celecoxib (CELEBREX) 100 MG capsule Take 1 capsule (100 mg total) by mouth 2 (two) times daily. 42 capsule 0   cloNIDine (CATAPRES) 0.1 MG tablet Take 1 tablet (0.1 mg total) by mouth 2 (two) times daily. 180 tablet 1   DULoxetine (CYMBALTA) 30 MG capsule Take 1 capsule (30 mg total) by mouth daily. 90 capsule 1   fluticasone (FLONASE) 50 MCG/ACT nasal spray Place 2 sprays into both nostrils daily. 16 g 6   fluticasone (FLOVENT HFA) 110 MCG/ACT inhaler Inhale 2 puffs into the lungs 2 (two) times daily. 12 g 3   meloxicam (MOBIC) 15 MG tablet Take 1 tablet  (15 mg total) by mouth daily as needed for pain. (Patient not taking: Reported on 04/22/2021) 30 tablet 3   metoprolol tartrate (LOPRESSOR) 25 MG tablet Take 1 tablet (25 mg total) by mouth 2 (two) times daily. 180 tablet 1   nitroGLYCERIN (NITROSTAT) 0.4 MG SL tablet Place 1 tablet (0.4 mg total) under the tongue every 5 (five) minutes as needed for chest pain. 25 tablet 11   omeprazole (PRILOSEC) 40 MG capsule Take 1 capsule (40 mg total) by mouth daily. 60 capsule 3   promethazine-dextromethorphan (PROMETHAZINE-DM) 6.25-15 MG/5ML syrup Take 5 ML at night as needed for cough , may take for severe cough in daytime 240 mL 0   Pumpkin Seed-Soy Germ (AZO BLADDER CONTROL/GO-LESS PO) Take 1 tablet by mouth daily. (Patient not taking: Reported on 04/22/2021)     tiZANidine (ZANAFLEX) 4 MG tablet Take 1 tablet (4 mg total) by mouth every 8 (eight) hours as needed for muscle spasms. (Patient not taking: Reported on 04/22/2021) 60 tablet 1   triamcinolone (KENALOG) 0.1 % Apply 1 application topically 2 (two) times daily. (Patient not taking: Reported on 04/22/2021) 30 g 0   No facility-administered medications prior to visit.    Allergies  Allergen Reactions   Isovue [Iopamidol] Tinitus    Sneezing and congestion post contrast administration, pt will require premeds for future contrasted exams    ROS Review of Systems    Objective:    Physical Exam  There were no vitals taken for this visit. Wt Readings from Last 3 Encounters:  01/19/21 291 lb (132 kg)  11/29/20 294 lb (133.4 kg)  07/07/20 294 lb (133.4 kg)     Health Maintenance Due  Topic Date Due   Zoster Vaccines- Shingrix (1 of 2) Never done   COVID-19 Vaccine (3 - Booster for Pfizer series) 04/25/2020   INFLUENZA VACCINE  Never done   PAP SMEAR-Modifier  03/28/2021    There are no preventive care reminders to display for this patient.  Lab Results  Component Value Date   TSH 1.897 ***Test methodology is 3rd generation TSH***  03/07/2009   Lab Results  Component Value Date   WBC 6.8 01/06/2020   HGB 11.5 01/06/2020   HCT 35.9 01/06/2020   MCV 87 01/06/2020   PLT 315 01/06/2020   Lab Results  Component Value Date   NA 139 02/09/2021   K 5.2 02/09/2021   CO2 22 02/09/2021  GLUCOSE 90 02/09/2021   BUN 14 02/09/2021   CREATININE 0.99 02/09/2021   BILITOT 0.4 02/09/2021   ALKPHOS 113 02/09/2021   AST 27 02/09/2021   ALT 29 02/09/2021   PROT 6.9 02/09/2021   ALBUMIN 4.3 02/09/2021   CALCIUM 9.2 02/09/2021   ANIONGAP 7 04/15/2017   EGFR 67 02/09/2021   Lab Results  Component Value Date   CHOL 138 02/09/2021   Lab Results  Component Value Date   HDL 54 02/09/2021   Lab Results  Component Value Date   LDLCALC 70 02/09/2021   Lab Results  Component Value Date   TRIG 71 02/09/2021   Lab Results  Component Value Date   CHOLHDL 2.6 02/09/2021   Lab Results  Component Value Date   HGBA1C 6.1 (H) 01/06/2020      Assessment & Plan:   Problem List Items Addressed This Visit   None   No orders of the defined types were placed in this encounter.   Follow-up: No follow-ups on file.    Asencion Noble, MD

## 2021-05-22 ENCOUNTER — Other Ambulatory Visit: Payer: Self-pay

## 2021-05-22 ENCOUNTER — Ambulatory Visit: Payer: Medicare Other | Attending: Critical Care Medicine | Admitting: Critical Care Medicine

## 2021-05-22 ENCOUNTER — Encounter: Payer: Self-pay | Admitting: Critical Care Medicine

## 2021-05-22 DIAGNOSIS — R0609 Other forms of dyspnea: Secondary | ICD-10-CM

## 2021-05-22 DIAGNOSIS — J4540 Moderate persistent asthma, uncomplicated: Secondary | ICD-10-CM

## 2021-05-22 MED ORDER — ALBUTEROL SULFATE HFA 108 (90 BASE) MCG/ACT IN AERS: 1.0000 | INHALATION_SPRAY | Freq: Four times a day (QID) | RESPIRATORY_TRACT | 1 refills | Status: DC | PRN

## 2021-05-22 MED ORDER — FLUTICASONE PROPIONATE 50 MCG/ACT NA SUSP: 2.0000 | Freq: Every day | NASAL | 6 refills | Status: AC

## 2021-05-22 MED ORDER — PROMETHAZINE-DM 6.25-15 MG/5ML PO SYRP: ORAL_SOLUTION | ORAL | 0 refills | Status: DC

## 2021-05-22 MED ORDER — FLUTICASONE PROPIONATE HFA 110 MCG/ACT IN AERO: 2.0000 | INHALATION_SPRAY | Freq: Two times a day (BID) | RESPIRATORY_TRACT | 3 refills | Status: DC

## 2021-05-22 NOTE — Progress Notes (Signed)
This was a very brief note.  The patient prefers a face-to-face exam we had to cancel today's visit as I am not in the clinic.  We will reschedule this patient and no charge will be given for this phone call

## 2021-06-06 ENCOUNTER — Other Ambulatory Visit: Payer: Self-pay

## 2021-06-08 ENCOUNTER — Telehealth: Payer: Self-pay

## 2021-06-08 NOTE — Telephone Encounter (Signed)
PA for Promethazine DM syrup denied because it is not being used for a 'medically accepted indication' per insurance judgement.

## 2021-06-14 ENCOUNTER — Other Ambulatory Visit: Payer: Self-pay | Admitting: Family Medicine

## 2021-06-14 DIAGNOSIS — E785 Hyperlipidemia, unspecified: Secondary | ICD-10-CM

## 2021-06-14 MED ORDER — ATORVASTATIN CALCIUM 10 MG PO TABS: 10.0000 mg | ORAL_TABLET | Freq: Every day | ORAL | 0 refills | Status: DC

## 2021-06-14 NOTE — Telephone Encounter (Signed)
Requested Prescriptions  Pending Prescriptions Disp Refills  . atorvastatin (LIPITOR) 10 MG tablet 90 tablet 0    Sig: Take 1 tablet (10 mg total) by mouth daily.     Cardiovascular:  Antilipid - Statins Passed - 06/14/2021 11:54 AM      Passed - Total Cholesterol in normal range and within 360 days    Cholesterol, Total  Date Value Ref Range Status  02/09/2021 138 100 - 199 mg/dL Final         Passed - LDL in normal range and within 360 days    LDL Chol Calc (NIH)  Date Value Ref Range Status  02/09/2021 70 0 - 99 mg/dL Final         Passed - HDL in normal range and within 360 days    HDL  Date Value Ref Range Status  02/09/2021 54 >39 mg/dL Final         Passed - Triglycerides in normal range and within 360 days    Triglycerides  Date Value Ref Range Status  02/09/2021 71 0 - 149 mg/dL Final         Passed - Patient is not pregnant      Passed - Valid encounter within last 12 months    Recent Outpatient Visits          3 weeks ago Moderate persistent asthma without complication   [place] Elsie Stain, MD   4 months ago Nipple discharge   [place], Enobong, MD   4 months ago Cough   [place] Elsie Stain, MD   11 months ago Muscle spasm   [place], Enobong, MD   1 year ago Essential hypertension   [place], Dionne Bucy, [place]      Future Appointments            In 4 weeks Joya Gaskins Burnett Harry, MD [place]

## 2021-06-14 NOTE — Telephone Encounter (Signed)
Copied from [place] [id number]. Topic: Quick Communication - Rx Refill/Question >> Jun 14, 2021 11:27 AM Tessa Lerner A wrote: Medication: atorvastatin (LIPITOR) 10 MG tablet [[id number]]   Has the patient contacted their pharmacy? Yes. The patient and their insurance provider were directed to contact the patient's PCP because this will be the first time using this location   (Agent: If no, request that the patient contact the pharmacy for the refill. If patient does not wish to contact the pharmacy document the reason why and proceed with request.) (Agent: If yes, when and what did the pharmacy advise?)  Preferred Pharmacy (with phone number or street name): [place] ([place]), [place] - [address]  Phone:  [phone] Fax:  [phone]  Has the patient been seen for an appointment in the last year OR does the patient have an upcoming appointment? Yes.    Agent: Please be advised that RX refills may take up to 3 business days. We ask that you follow-up with your pharmacy.

## 2021-06-26 DIAGNOSIS — G4733 Obstructive sleep apnea (adult) (pediatric): Secondary | ICD-10-CM | POA: Diagnosis not present

## 2021-07-10 ENCOUNTER — Other Ambulatory Visit: Payer: Self-pay | Admitting: Family Medicine

## 2021-07-10 DIAGNOSIS — I10 Essential (primary) hypertension: Secondary | ICD-10-CM

## 2021-07-10 MED ORDER — AMLODIPINE BESYLATE 5 MG PO TABS: 5.0000 mg | ORAL_TABLET | Freq: Every day | ORAL | 0 refills | Status: DC

## 2021-07-10 NOTE — Telephone Encounter (Signed)
Requested Prescriptions  Pending Prescriptions Disp Refills  . amLODipine (NORVASC) 5 MG tablet 90 tablet 0    Sig: Take 1 tablet (5 mg total) by mouth daily.     Cardiovascular:  Calcium Channel Blockers Failed - 07/10/2021  3:34 PM      Failed - Last BP in normal range    BP Readings from Last 1 Encounters:  04/24/21 (!) 146/83         Passed - Valid encounter within last 6 months    Recent Outpatient Visits          1 month ago Moderate persistent asthma without complication   [place] Elsie Stain, MD   5 months ago Nipple discharge   [place], Enobong, MD   5 months ago Cough   [place], MD   1 year ago Muscle spasm   [place], Enobong, MD   1 year ago Essential hypertension   [place], [place]      Future Appointments            In 1 month Joya Gaskins Burnett Harry, MD [place]

## 2021-07-10 NOTE — Telephone Encounter (Signed)
Medication Refill - Medication:amLODipine (NORVASC) 5 MG tablet   Has the patient contacted their pharmacy? yes (Agent: If no, request that the patient contact the pharmacy for the refill. If patient does not wish to contact the pharmacy document the reason why and proceed with request.) (Agent: If yes, when and what did the pharmacy advise?)contact pcp   Preferred Pharmacy (with phone number or street name): [place] ([place]), [place] - [address] Phone:  [phone]  Fax:  [phone]     Has the patient been seen for an appointment in the last year OR does the patient have an upcoming appointment? yes  Agent: Please be advised that RX refills may take up to 3 business days. We ask that you follow-up with your pharmacy.

## 2021-07-12 ENCOUNTER — Ambulatory Visit: Payer: Medicare Other | Admitting: Critical Care Medicine

## 2021-08-22 ENCOUNTER — Ambulatory Visit: Payer: Medicare Other | Attending: Critical Care Medicine | Admitting: Critical Care Medicine

## 2021-08-22 ENCOUNTER — Encounter: Payer: Self-pay | Admitting: Critical Care Medicine

## 2021-08-22 ENCOUNTER — Other Ambulatory Visit: Payer: Self-pay

## 2021-08-22 VITALS — BP 147/97 | HR 88 | Resp 16 | Wt 286.8 lb

## 2021-08-22 DIAGNOSIS — R053 Chronic cough: Secondary | ICD-10-CM | POA: Diagnosis not present

## 2021-08-22 DIAGNOSIS — G4733 Obstructive sleep apnea (adult) (pediatric): Secondary | ICD-10-CM

## 2021-08-22 DIAGNOSIS — E785 Hyperlipidemia, unspecified: Secondary | ICD-10-CM

## 2021-08-22 DIAGNOSIS — I10 Essential (primary) hypertension: Secondary | ICD-10-CM

## 2021-08-22 DIAGNOSIS — R0609 Other forms of dyspnea: Secondary | ICD-10-CM | POA: Diagnosis not present

## 2021-08-22 DIAGNOSIS — J4540 Moderate persistent asthma, uncomplicated: Secondary | ICD-10-CM | POA: Diagnosis not present

## 2021-08-22 MED ORDER — AMLODIPINE BESYLATE 5 MG PO TABS: 5.0000 mg | ORAL_TABLET | Freq: Every day | ORAL | 2 refills | Status: DC

## 2021-08-22 MED ORDER — FLUTICASONE PROPIONATE HFA 44 MCG/ACT IN AERO: 2.0000 | INHALATION_SPRAY | Freq: Two times a day (BID) | RESPIRATORY_TRACT | 2 refills | Status: DC

## 2021-08-22 MED ORDER — ATORVASTATIN CALCIUM 10 MG PO TABS: 10.0000 mg | ORAL_TABLET | Freq: Every day | ORAL | 2 refills | Status: DC

## 2021-08-22 MED ORDER — ALBUTEROL SULFATE HFA 108 (90 BASE) MCG/ACT IN AERS: 1.0000 | INHALATION_SPRAY | Freq: Four times a day (QID) | RESPIRATORY_TRACT | 1 refills | Status: DC | PRN

## 2021-08-22 NOTE — Patient Instructions (Signed)
Refills on medications sent to your [place] declined vaccinations  Keep up good work on your diet  Return Dr Joya Gaskins 5 months

## 2021-08-22 NOTE — Progress Notes (Signed)
Established Patient Office Visit  Subjective:  Patient ID: Cathy [name], female    DOB: [birth year]  Age: 61 y.o. MRN: [id number]  CC: Asthma follow-up  HPI Cathy [name] presents for asthma care.  Patient states since the last visit her breathing has improved.  She moved to a first-floor apartment this is is helped her condition.  She uses her Flovent and albuterol inhaler as needed.  She also is using CPAP at night for her sleep apnea which is helped significantly.  On arrival blood pressure 147/97.  She had not been taking her blood pressure medicines recently.  Patient declined to receive flu vaccine or pneumonia vaccine.  Past Medical History:  Diagnosis Date   Arthritis    Back pain    Cough 11/29/2020   Depression    Hyperlipidemia    Hypertension    Sleep apnea    cpap    Past Surgical History:  Procedure Laterality Date   [place]   states constipated since then   KNEE SURGERY     LEFT HEART CATH AND CORONARY ANGIOGRAPHY N/A 05/20/2017   Procedure: LEFT HEART CATH AND CORONARY ANGIOGRAPHY;  Surgeon: Leonie Man, MD;  Location: [facility];  Service: Cardiovascular;  Laterality: N/A;    Family History  Problem Relation Age of Onset   Stroke Mother    Cancer Father    Colon cancer Father    Heart disease Brother    Colon polyps Neg Hx    Esophageal cancer Neg Hx    Rectal cancer Neg Hx    Stomach cancer Neg Hx     Social History   Socioeconomic History   Marital status: Divorced    Spouse name: Not on file   Number of children: Not on file   Years of education: Not on file   Highest education level: Not on file  Occupational History   Not on file  Tobacco Use   Smoking status: Former    Types: Cigarettes    Quit date: 1995    Years since quitting: 28.0   Smokeless tobacco: Never  Vaping Use   Vaping Use: Never used  Substance and Sexual Activity   Alcohol use: No   Drug use: No   Sexual  activity: Not Currently  Other Topics Concern   Not on file  Social History Narrative   Not on file   Social Determinants of Health   Financial Resource Strain: Low Risk    Difficulty of Paying Living Expenses: Not very hard  Food Insecurity: No Food Insecurity   Worried About Charity fundraiser in the Last Year: Never true   Ran Out of Food in the Last Year: Never true  Transportation Needs: No Transportation Needs   Lack of Transportation (Medical): No   Lack of Transportation (Non-Medical): No  Physical Activity: Inactive   Days of Exercise per Week: 0 days   Minutes of Exercise per Session: 0 min  Stress: Stress Concern Present   Feeling of Stress : To some extent  Social Connections: Socially Isolated   Frequency of Communication with Friends and Family: More than three times a week   Frequency of Social Gatherings with Friends and Family: Once a week   Attends Religious Services: Never   Marine scientist or Organizations: No   Attends Archivist Meetings: Never   Marital Status: Divorced  Human resources officer Violence: Not At Risk  Fear of Current or Ex-Partner: No   Emotionally Abused: No   Physically Abused: No   Sexually Abused: No    Outpatient Medications Prior to Visit  Medication Sig Dispense Refill   aspirin EC 81 MG tablet Take 1 tablet (81 mg total) by mouth 2 (two) times daily. 100 tablet 1   fluticasone (FLONASE) 50 MCG/ACT nasal spray Place 2 sprays into both nostrils daily. 16 g 6   albuterol (VENTOLIN HFA) 108 (90 Base) MCG/ACT inhaler Inhale 1-2 puffs into the lungs every 6 (six) hours as needed for wheezing or shortness of breath. 18 g 1   fluticasone (FLOVENT HFA) 110 MCG/ACT inhaler Inhale 2 puffs into the lungs 2 (two) times daily. 12 g 3   nitroGLYCERIN (NITROSTAT) 0.4 MG SL tablet Place 1 tablet (0.4 mg total) under the tongue every 5 (five) minutes as needed for chest pain. 25 tablet 11   Pumpkin Seed-Soy Germ (AZO BLADDER  CONTROL/GO-LESS PO) Take 1 tablet by mouth daily. (Patient not taking: Reported on 08/22/2021)     amLODipine (NORVASC) 5 MG tablet Take 1 tablet (5 mg total) by mouth daily. (Patient not taking: Reported on 08/22/2021) 90 tablet 0   atorvastatin (LIPITOR) 10 MG tablet Take 1 tablet (10 mg total) by mouth daily. (Patient not taking: Reported on 08/22/2021) 90 tablet 0   celecoxib (CELEBREX) 100 MG capsule Take 1 capsule (100 mg total) by mouth 2 (two) times daily. (Patient not taking: Reported on 08/22/2021) 42 capsule 0   cloNIDine (CATAPRES) 0.1 MG tablet Take 1 tablet (0.1 mg total) by mouth 2 (two) times daily. (Patient not taking: Reported on 08/22/2021) 180 tablet 1   DULoxetine (CYMBALTA) 30 MG capsule Take 1 capsule (30 mg total) by mouth daily. (Patient not taking: Reported on 08/22/2021) 90 capsule 1   meloxicam (MOBIC) 15 MG tablet Take 1 tablet (15 mg total) by mouth daily as needed for pain. (Patient not taking: Reported on 08/22/2021) 30 tablet 3   metoprolol tartrate (LOPRESSOR) 25 MG tablet Take 1 tablet (25 mg total) by mouth 2 (two) times daily. 180 tablet 1   omeprazole (PRILOSEC) 40 MG capsule Take 1 capsule (40 mg total) by mouth daily. (Patient not taking: Reported on 08/22/2021) 60 capsule 3   promethazine-dextromethorphan (PROMETHAZINE-DM) 6.25-15 MG/5ML syrup Take 5 ML at night as needed for cough , may take for severe cough in daytime (Patient not taking: Reported on 08/22/2021) 240 mL 0   tiZANidine (ZANAFLEX) 4 MG tablet Take 1 tablet (4 mg total) by mouth every 8 (eight) hours as needed for muscle spasms. (Patient not taking: Reported on 08/22/2021) 60 tablet 1   triamcinolone (KENALOG) 0.1 % Apply 1 application topically 2 (two) times daily. (Patient not taking: Reported on 08/22/2021) 30 g 0   No facility-administered medications prior to visit.    Allergies  Allergen Reactions   Isovue [Iopamidol] Tinitus    Sneezing and congestion post contrast administration, pt will require  premeds for future contrasted exams    ROS Review of Systems  Constitutional:  Negative for chills, diaphoresis and fever.  HENT:  Negative for congestion, hearing loss, nosebleeds, sore throat and tinnitus.   Eyes:  Negative for photophobia and redness.  Respiratory:  Negative for cough, shortness of breath, wheezing and stridor.   Cardiovascular:  Negative for chest pain, palpitations and leg swelling.  Gastrointestinal:  Negative for abdominal pain, blood in stool, constipation, diarrhea, nausea and vomiting.  Endocrine: Negative for polydipsia.  Genitourinary:  Negative  for dysuria, flank pain, frequency, hematuria and urgency.  Musculoskeletal:  Negative for back pain, myalgias and neck pain.  Skin:  Negative for rash.  Allergic/Immunologic: Negative for environmental allergies.  Neurological:  Negative for dizziness, tremors, seizures, weakness and headaches.  Hematological:  Does not bruise/bleed easily.  Psychiatric/Behavioral:  Negative for suicidal ideas. The patient is not nervous/anxious.      Objective:    Physical Exam Vitals reviewed.  Constitutional:      Appearance: Normal appearance. She is well-developed. She is obese. She is not diaphoretic.  HENT:     Head: Normocephalic and atraumatic.     Nose: No nasal deformity, septal deviation, mucosal edema or rhinorrhea.     Right Sinus: No maxillary sinus tenderness or frontal sinus tenderness.     Left Sinus: No maxillary sinus tenderness or frontal sinus tenderness.     Mouth/Throat:     Pharynx: No oropharyngeal exudate.  Eyes:     General: No scleral icterus.    Conjunctiva/sclera: Conjunctivae normal.     Pupils: Pupils are equal, round, and reactive to light.  Neck:     Thyroid: No thyromegaly.     Vascular: No carotid bruit or JVD.     Trachea: Trachea normal. No tracheal tenderness or tracheal deviation.  Cardiovascular:     Rate and Rhythm: Normal rate and regular rhythm.     Chest Wall: PMI is not  displaced.     Pulses: Normal pulses. No decreased pulses.     Heart sounds: Normal heart sounds, S1 normal and S2 normal. Heart sounds not distant. No murmur heard. No systolic murmur is present.  No diastolic murmur is present.    No friction rub. No gallop. No S3 or S4 sounds.  Pulmonary:     Effort: No tachypnea, accessory muscle usage or respiratory distress.     Breath sounds: No stridor. No decreased breath sounds, wheezing, rhonchi or rales.  Chest:     Chest wall: No tenderness.  Abdominal:     General: Bowel sounds are normal. There is no distension.     Palpations: Abdomen is soft. Abdomen is not rigid.     Tenderness: There is no abdominal tenderness. There is no guarding or rebound.  Musculoskeletal:        General: Normal range of motion.     Cervical back: Normal range of motion and neck supple. No edema, erythema or rigidity. No muscular tenderness. Normal range of motion.  Lymphadenopathy:     Head:     Right side of head: No submental or submandibular adenopathy.     Left side of head: No submental or submandibular adenopathy.     Cervical: No cervical adenopathy.  Skin:    General: Skin is warm and dry.     Coloration: Skin is not pale.     Findings: No rash.     Nails: There is no clubbing.  Neurological:     Mental Status: She is alert and oriented to person, place, and time.     Sensory: No sensory deficit.  Psychiatric:        Speech: Speech normal.        Behavior: Behavior normal.    BP (!) 147/97    Pulse 88    Resp 16    Wt 286 lb 12.8 oz (130.1 kg)    SpO2 98%    BMI 49.23 kg/m  Wt Readings from Last 3 Encounters:  08/22/21 286 lb 12.8 oz (130.1 kg)  01/19/21 291 lb (132 kg)  11/29/20 294 lb (133.4 kg)     Health Maintenance Due  Topic Date Due   Pneumococcal Vaccine 72-37 Years old (1 - PCV) Never done   Zoster Vaccines- Shingrix (1 of 2) Never done   COVID-19 Vaccine (3 - Booster for Pfizer series) 01/19/2020   INFLUENZA VACCINE  Never  done   PAP SMEAR-Modifier  03/28/2021    There are no preventive care reminders to display for this patient.  Lab Results  Component Value Date   WBC 6.8 01/06/2020   HGB 11.5 01/06/2020   HCT 35.9 01/06/2020   MCV 87 01/06/2020   PLT 315 01/06/2020   Lab Results  Component Value Date   NA 139 02/09/2021   K 5.2 02/09/2021   CO2 22 02/09/2021   GLUCOSE 90 02/09/2021   BUN 14 02/09/2021   CREATININE 0.99 02/09/2021   BILITOT 0.4 02/09/2021   ALKPHOS 113 02/09/2021   AST 27 02/09/2021   ALT 29 02/09/2021   PROT 6.9 02/09/2021   ALBUMIN 4.3 02/09/2021   CALCIUM 9.2 02/09/2021   ANIONGAP 7 04/15/2017   EGFR 67 02/09/2021   Lab Results  Component Value Date   CHOL 138 02/09/2021   Lab Results  Component Value Date   HDL 54 02/09/2021   Lab Results  Component Value Date   LDLCALC 70 02/09/2021   Lab Results  Component Value Date   TRIG 71 02/09/2021   Lab Results  Component Value Date   CHOLHDL 2.6 02/09/2021   Lab Results  Component Value Date   HGBA1C 6.1 (H) 01/06/2020      Assessment & Plan:   Problem List Items Addressed This Visit       Cardiovascular and Mediastinum   Hypertension    Blood pressure is elevated patient agrees to receive refills and resume blood pressure medications refills given      Relevant Medications   amLODipine (NORVASC) 5 MG tablet   atorvastatin (LIPITOR) 10 MG tablet     Respiratory   Obstructive sleep apnea    Patient is improved and compliant with CPAP      Asthma, moderate persistent    Stable at this time normal pulmonary functions previously noted in 2019  Continue Flovent and can use this as an as-needed basis on cyclic treatment  Albuterol as needed both were refilled      Relevant Medications   fluticasone (FLOVENT HFA) 44 MCG/ACT inhaler   albuterol (VENTOLIN HFA) 108 (90 Base) MCG/ACT inhaler     Other   RESOLVED: Cough    Chronic cough has resolved      Other Visit Diagnoses      Essential hypertension       Relevant Medications   amLODipine (NORVASC) 5 MG tablet   atorvastatin (LIPITOR) 10 MG tablet   Hyperlipidemia, unspecified hyperlipidemia type       Relevant Medications   amLODipine (NORVASC) 5 MG tablet   atorvastatin (LIPITOR) 10 MG tablet   Dyspnea on exertion       Relevant Medications   fluticasone (FLOVENT HFA) 44 MCG/ACT inhaler   albuterol (VENTOLIN HFA) 108 (90 Base) MCG/ACT inhaler       Meds ordered this encounter  Medications   amLODipine (NORVASC) 5 MG tablet    Sig: Take 1 tablet (5 mg total) by mouth daily.    Dispense:  90 tablet    Refill:  2   atorvastatin (LIPITOR) 10 MG tablet  Sig: Take 1 tablet (10 mg total) by mouth daily.    Dispense:  90 tablet    Refill:  2   fluticasone (FLOVENT HFA) 44 MCG/ACT inhaler    Sig: Inhale 2 puffs into the lungs 2 (two) times daily.    Dispense:  10.6 g    Refill:  2   albuterol (VENTOLIN HFA) 108 (90 Base) MCG/ACT inhaler    Sig: Inhale 1-2 puffs into the lungs every 6 (six) hours as needed for wheezing or shortness of breath.    Dispense:  18 g    Refill:  1    Follow-up: Return in about 5 months (around 01/20/2022).    Asencion Noble, MD

## 2021-08-23 ENCOUNTER — Encounter: Payer: Self-pay | Admitting: Critical Care Medicine

## 2021-08-23 NOTE — Assessment & Plan Note (Signed)
Patient is improved and compliant with CPAP

## 2021-08-23 NOTE — Assessment & Plan Note (Signed)
Blood pressure is elevated patient agrees to receive refills and resume blood pressure medications refills given

## 2021-08-23 NOTE — Assessment & Plan Note (Signed)
Stable at this time normal pulmonary functions previously noted in 2019  Continue Flovent and can use this as an as-needed basis on cyclic treatment  Albuterol as needed both were refilled

## 2021-08-23 NOTE — Assessment & Plan Note (Signed)
Chronic cough has resolved

## 2021-08-23 NOTE — Assessment & Plan Note (Signed)
Morbid obesity persists patient is working on an active diet she has lost 10 pounds in weight congratulated her and encouraged further weight loss

## 2021-09-25 DIAGNOSIS — G4733 Obstructive sleep apnea (adult) (pediatric): Secondary | ICD-10-CM | POA: Diagnosis not present

## 2021-10-05 ENCOUNTER — Telehealth: Payer: Self-pay

## 2021-10-05 NOTE — Telephone Encounter (Signed)
Patient came into the office and was asking if Dr.Newlin would place a new order for her machine. Lamyra said [facility] reached out to her earlier this week and told her that there needs to be a new order or they will not send it to her.  ?

## 2021-10-05 NOTE — Telephone Encounter (Signed)
Pt is needing new script for CPAP machine. ?

## 2021-10-05 NOTE — Telephone Encounter (Signed)
Pt is wanting to get a new a CPAP machine, she states that she has had the same machine for 4 years. ? ?What needs to be done for patient. ?

## 2021-10-05 NOTE — Telephone Encounter (Signed)
It sounds like we just need to send a new order to [facility].  As long as she has been using it consistently, she should not need a new sleep study.  ?

## 2021-10-06 MED ORDER — MISC. DEVICES MISC: 0 refills | Status: DC

## 2021-10-06 MED ORDER — MISC. DEVICES MISC: 0 refills | Status: AC

## 2021-10-06 NOTE — Telephone Encounter (Signed)
Done

## 2021-10-06 NOTE — Telephone Encounter (Signed)
Order for CPAP machine faxed to [facility] 

## 2021-10-06 NOTE — Telephone Encounter (Signed)
Sleep study from 2019 does not have settings.  Are we able to obtain the settings for the current CPAP machine she uses?  Thank you. ?

## 2021-11-14 ENCOUNTER — Encounter: Payer: Self-pay | Admitting: Gastroenterology

## 2021-11-23 ENCOUNTER — Telehealth: Payer: Self-pay

## 2021-11-23 NOTE — Telephone Encounter (Signed)
Call placed to [facility], spoke to [facility] who said that they reached out to the patient on 11/07/2021 regarding her account. She needs to clear her account before they can provide any further services ?

## 2021-11-28 ENCOUNTER — Encounter: Payer: Self-pay | Admitting: Nurse Practitioner

## 2021-11-28 ENCOUNTER — Ambulatory Visit: Payer: Medicare Other | Attending: Nurse Practitioner | Admitting: Nurse Practitioner

## 2021-11-28 DIAGNOSIS — S6991XD Unspecified injury of right wrist, hand and finger(s), subsequent encounter: Secondary | ICD-10-CM

## 2021-11-28 MED ORDER — IBUPROFEN 600 MG PO TABS: 600.0000 mg | ORAL_TABLET | Freq: Three times a day (TID) | ORAL | 0 refills | Status: AC | PRN

## 2021-11-28 NOTE — Progress Notes (Signed)
Virtual Visit via Telephone Note ? I discussed the limitations, risks, security and privacy concerns of performing an evaluation and management service by telephone and the availability of in person appointments. I also discussed with the patient that there may be a patient responsible charge related to this service. The patient expressed understanding and agreed to proceed.  ? ? ?I connected with Cathy [name] on 11/28/21  at  10:10 AM EDT  EDT by telephone and verified that I am speaking with the correct person using two identifiers. ? ?Location of Patient: ?Private Residence ?  ?Location of Provider: ?Scientist, research (physical sciences) and [employer] Office  ?  ?Persons participating in Telemedicine visit: ?Geryl Rankins FNP-BC ?[name]  ?  ?History of Present Illness: ?Telemedicine visit for: Right ring finger injury ? ?Patient states her right ring finger got caught in between a folding chair last Monday (8 days ago).  Main issue today is pain and swelling which is slightly improved since she started wearing a metal splint that she purchased on her own from the store.  She is taking Tylenol with little relief.  She does endorse a subungual hematoma of the right nailbed.  She is able to bend the finger however mobility is limited due to swelling.  Requesting medication for pain today. ? ? ? ?Past Medical History:  ?Diagnosis Date  ? Arthritis   ? Back pain   ? Cough 11/29/2020  ? Depression   ? Hyperlipidemia   ? Hypertension   ? Sleep apnea   ? cpap  ?  ?Past Surgical History:  ?Procedure Laterality Date  ? BACK SURGERY    ? ECTOPIC PREGNANCY SURGERY  1987  ? states constipated since then  ? KNEE SURGERY    ? LEFT HEART CATH AND CORONARY ANGIOGRAPHY N/A 05/20/2017  ? Procedure: LEFT HEART CATH AND CORONARY ANGIOGRAPHY;  Surgeon: Leonie Man, MD;  Location: [facility];  Service: Cardiovascular;  Laterality: N/A;  ?  ?Family History  ?Problem Relation Age of Onset  ? Stroke Mother   ? Cancer Father   ?  Colon cancer Father   ? Heart disease Brother   ? Colon polyps Neg Hx   ? Esophageal cancer Neg Hx   ? Rectal cancer Neg Hx   ? Stomach cancer Neg Hx   ?  ?Social History  ? ?Socioeconomic History  ? Marital status: Divorced  ?  Spouse name: Not on file  ? Number of children: Not on file  ? Years of education: Not on file  ? Highest education level: Not on file  ?Occupational History  ? Not on file  ?Tobacco Use  ? Smoking status: Former  ?  Types: Cigarettes  ?  Quit date: 1995  ?  Years since quitting: 28.3  ? Smokeless tobacco: Never  ?Vaping Use  ? Vaping Use: Never used  ?Substance and Sexual Activity  ? Alcohol use: No  ? Drug use: No  ? Sexual activity: Not Currently  ?Other Topics Concern  ? Not on file  ?Social History Narrative  ? Not on file  ? ?Social Determinants of Health  ? ?Financial Resource Strain: Low Risk   ? Difficulty of Paying Living Expenses: Not very hard  ?Food Insecurity: No Food Insecurity  ? Worried About Charity fundraiser in the Last Year: Never true  ? Ran Out of Food in the Last Year: Never true  ?Transportation Needs: No Transportation Needs  ? Lack of Transportation (Medical): No  ?  Lack of Transportation (Non-Medical): No  ?Physical Activity: Inactive  ? Days of Exercise per Week: 0 days  ? Minutes of Exercise per Session: 0 min  ?Stress: Stress Concern Present  ? Feeling of Stress : To some extent  ?Social Connections: Socially Isolated  ? Frequency of Communication with Friends and Family: More than three times a week  ? Frequency of Social Gatherings with Friends and Family: Once a week  ? Attends Religious Services: Never  ? Active Member of Clubs or Organizations: No  ? Attends Archivist Meetings: Never  ? Marital Status: Divorced  ?  ? ?Observations/Objective: ?Awake, alert and oriented x 3 ? ? ?Review of Systems  ?Constitutional:  Negative for fever, malaise/fatigue and weight loss.  ?HENT: Negative.  Negative for nosebleeds.   ?Eyes: Negative.  Negative for  blurred vision, double vision and photophobia.  ?Respiratory: Negative.  Negative for cough and shortness of breath.   ?Cardiovascular: Negative.  Negative for chest pain, palpitations and leg swelling.  ?Gastrointestinal: Negative.  Negative for heartburn, nausea and vomiting.  ?Musculoskeletal:  Negative for myalgias.  ?     See HPI  ?Neurological: Negative.  Negative for dizziness, focal weakness, seizures and headaches.  ?Psychiatric/Behavioral: Negative.  Negative for suicidal ideas.    ?Assessment and Plan: ?Diagnoses and all orders for this visit: ? ?Injury of finger of right hand, subsequent encounter ?-     ibuprofen (ADVIL) 600 MG tablet; Take 1 tablet (600 mg total) by mouth every 8 (eight) hours as needed for up to 14 days. ?Ice application for swelling and pain was recommended ? ?Follow Up Instructions ?Return if symptoms worsen or fail to improve.  ? ?  ?I discussed the assessment and treatment plan with the patient. The patient was provided an opportunity to ask questions and all were answered. The patient agreed with the plan and demonstrated an understanding of the instructions. ?  ?The patient was advised to call back or seek an in-person evaluation if the symptoms worsen or if the condition fails to improve as anticipated. ? ?I provided 10 minutes of non-face-to-face time during this encounter including median intraservice time, reviewing previous notes, labs, imaging, medications and explaining diagnosis and management. ? ?Gildardo Pounds, FNP-BC  ?

## 2021-12-25 ENCOUNTER — Telehealth: Payer: Self-pay

## 2021-12-25 NOTE — Telephone Encounter (Signed)
I spoke to [facility] regarding the status of CPAP order.  She said that the patient  has not received her machine yet and her account remains on hold until she speaks with billing.  Jeanella Anton said that they left another voicemail message for the patient today requesting she contact their office.

## 2021-12-26 DIAGNOSIS — G4733 Obstructive sleep apnea (adult) (pediatric): Secondary | ICD-10-CM | POA: Diagnosis not present

## 2022-01-22 ENCOUNTER — Ambulatory Visit: Payer: Medicare Other | Admitting: Family Medicine

## 2022-03-26 DIAGNOSIS — G4733 Obstructive sleep apnea (adult) (pediatric): Secondary | ICD-10-CM | POA: Diagnosis not present

## 2022-04-17 ENCOUNTER — Ambulatory Visit (HOSPITAL_COMMUNITY)
Admission: EM | Admit: 2022-04-17 | Discharge: 2022-04-17 | Disposition: A | Discharge status: Home / Self Care | Payer: Medicare Other | Attending: Internal Medicine | Admitting: Internal Medicine

## 2022-04-17 ENCOUNTER — Encounter (HOSPITAL_COMMUNITY): Payer: Self-pay | Admitting: *Deleted

## 2022-04-17 DIAGNOSIS — R519 Headache, unspecified: Secondary | ICD-10-CM | POA: Diagnosis not present

## 2022-04-17 MED ORDER — ACETAMINOPHEN 325 MG PO TABS
975.0000 mg | ORAL_TABLET | Freq: Once | ORAL | Status: AC
  Administered 2022-04-17: 975 mg via ORAL

## 2022-04-17 MED ORDER — KETOROLAC TROMETHAMINE 30 MG/ML IJ SOLN
30.0000 mg | Freq: Once | INTRAMUSCULAR | Status: AC
  Administered 2022-04-17: 30 mg via INTRAMUSCULAR

## 2022-04-17 MED ORDER — ACETAMINOPHEN 325 MG PO TABS
ORAL_TABLET | ORAL | Status: AC
  Filled 2022-04-17: qty 3

## 2022-04-17 MED ORDER — TIZANIDINE HCL 2 MG PO CAPS: 2.0000 mg | ORAL_CAPSULE | Freq: Three times a day (TID) | ORAL | 0 refills | Status: DC

## 2022-04-17 MED ORDER — KETOROLAC TROMETHAMINE 30 MG/ML IJ SOLN
INTRAMUSCULAR | Status: AC
  Filled 2022-04-17: qty 1

## 2022-04-17 MED ORDER — ACETAMINOPHEN 500 MG PO TABS: 1000.0000 mg | ORAL_TABLET | Freq: Four times a day (QID) | ORAL | 0 refills | Status: DC | PRN

## 2022-04-17 MED ORDER — IBUPROFEN 600 MG PO TABS: 600.0000 mg | ORAL_TABLET | Freq: Four times a day (QID) | ORAL | 0 refills | Status: DC | PRN
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
## 2022-04-17 NOTE — Discharge Instructions (Signed)
We gave you an injection in the clinic to help with your headache that is similar to ibuprofen.  You may start taking ibuprofen 600 mg every 6 hours as needed starting tomorrow for ongoing head pain.  Do not take any ibuprofen today since we gave you the injection in the clinic.   We gave you a dose of Tylenol in the clinic today as well.  Your next dose of Tylenol may be at 7 PM tonight.  You may take this every 6 hours as needed at home.  You may also take Zanaflex muscle relaxer 2 mg every 8 hours as needed for muscle spasm.  Mainly take this at nighttime as it can make you sleepy.   Perform gentle range of motion exercises to the right neck/upper back and apply heat to the areas of greatest tenderness to further help reduce pain and muscle spasm.   If you develop any new or worsening symptoms or do not improve in the next 2 to 3 days, please return.  If your symptoms are severe, please go to the emergency room.  Follow-up with your primary care provider for further evaluation and management of your symptoms as well as ongoing wellness visits.  I hope you feel better!
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
## 2022-04-26 ENCOUNTER — Other Ambulatory Visit: Payer: Self-pay

## 2022-04-26 ENCOUNTER — Encounter (HOSPITAL_COMMUNITY): Payer: Self-pay | Admitting: Emergency Medicine

## 2022-04-26 ENCOUNTER — Emergency Department (HOSPITAL_COMMUNITY)
Admission: EM | Admit: 2022-04-26 | Discharge: 2022-04-26 | Disposition: A | Discharge status: Home / Self Care | Payer: No Typology Code available for payment source | Attending: Emergency Medicine | Admitting: Emergency Medicine

## 2022-04-26 ENCOUNTER — Emergency Department (HOSPITAL_COMMUNITY): Payer: No Typology Code available for payment source

## 2022-04-26 DIAGNOSIS — Y92410 Unspecified street and highway as the place of occurrence of the external cause: Secondary | ICD-10-CM | POA: Diagnosis not present

## 2022-04-26 DIAGNOSIS — R519 Headache, unspecified: Secondary | ICD-10-CM | POA: Diagnosis not present

## 2022-04-26 DIAGNOSIS — Z7982 Long term (current) use of aspirin: Secondary | ICD-10-CM | POA: Insufficient documentation

## 2022-04-26 DIAGNOSIS — S199XXA Unspecified injury of neck, initial encounter: Secondary | ICD-10-CM | POA: Diagnosis not present

## 2022-04-26 DIAGNOSIS — S0990XA Unspecified injury of head, initial encounter: Secondary | ICD-10-CM | POA: Diagnosis not present

## 2022-04-26 DIAGNOSIS — M542 Cervicalgia: Secondary | ICD-10-CM | POA: Insufficient documentation

## 2022-04-26 MED ORDER — DIPHENHYDRAMINE HCL 50 MG/ML IJ SOLN
12.5000 mg | Freq: Once | INTRAMUSCULAR | Status: AC
  Administered 2022-04-26: 12.5 mg via INTRAVENOUS
  Filled 2022-04-26: qty 1

## 2022-04-26 MED ORDER — DEXAMETHASONE SODIUM PHOSPHATE 10 MG/ML IJ SOLN
10.0000 mg | Freq: Once | INTRAMUSCULAR | Status: AC
  Administered 2022-04-26: 10 mg via INTRAVENOUS
  Filled 2022-04-26: qty 1

## 2022-04-26 MED ORDER — PROCHLORPERAZINE EDISYLATE 10 MG/2ML IJ SOLN
10.0000 mg | Freq: Once | INTRAMUSCULAR | Status: AC
  Administered 2022-04-26: 10 mg via INTRAVENOUS
  Filled 2022-04-26: qty 2

## 2022-04-26 MED ORDER — MORPHINE SULFATE (PF) 4 MG/ML IV SOLN
4.0000 mg | Freq: Once | INTRAVENOUS | Status: AC
  Administered 2022-04-26: 4 mg via INTRAVENOUS
  Filled 2022-04-26: qty 1

## 2022-04-26 NOTE — ED Notes (Signed)
Pt verbalized understanding of d/c instructions, meds, and followup care. Denies questions. VSS, no distress noted. Steady gait to exit with all belongings.  ?

## 2022-04-26 NOTE — ED Notes (Signed)
Patient transported to CT 

## 2022-04-26 NOTE — ED Notes (Signed)
Pt reports improved HA pain. Pt able to rest at this time.

## 2022-04-26 NOTE — ED Triage Notes (Signed)
Patient reports headache onset last night unrelieved by prescription medication ,  denies head injury , mild photophobia / no nausea .
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
## 2022-04-26 NOTE — Discharge Instructions (Signed)
Continue use of Tylenol, Motrin, and muscle relaxer.  Follow-up with your primary doctor.  Return to the ED as needed, and for any new or worsening symptoms.

## 2022-05-07 ENCOUNTER — Encounter: Payer: Self-pay | Admitting: Family Medicine

## 2022-05-07 ENCOUNTER — Ambulatory Visit: Payer: Medicare Other | Attending: Family Medicine | Admitting: Family Medicine

## 2022-05-07 VITALS — BP 140/92 | HR 94 | Ht 64.0 in | Wt 262.0 lb

## 2022-05-07 DIAGNOSIS — R7303 Prediabetes: Secondary | ICD-10-CM | POA: Diagnosis not present

## 2022-05-07 DIAGNOSIS — G4733 Obstructive sleep apnea (adult) (pediatric): Secondary | ICD-10-CM

## 2022-05-07 DIAGNOSIS — J4540 Moderate persistent asthma, uncomplicated: Secondary | ICD-10-CM

## 2022-05-07 DIAGNOSIS — R519 Headache, unspecified: Secondary | ICD-10-CM

## 2022-05-07 DIAGNOSIS — E785 Hyperlipidemia, unspecified: Secondary | ICD-10-CM | POA: Diagnosis not present

## 2022-05-07 DIAGNOSIS — I10 Essential (primary) hypertension: Secondary | ICD-10-CM

## 2022-05-07 DIAGNOSIS — G8929 Other chronic pain: Secondary | ICD-10-CM | POA: Diagnosis not present

## 2022-05-07 LAB — POCT GLYCOSYLATED HEMOGLOBIN (HGB A1C): HbA1c, POC (prediabetic range): 5.8 % (ref 5.7–6.4)

## 2022-05-07 MED ORDER — TOPIRAMATE 50 MG PO TABS: 50.0000 mg | ORAL_TABLET | Freq: Two times a day (BID) | ORAL | 1 refills | Status: DC

## 2022-05-07 MED ORDER — AMLODIPINE BESYLATE 5 MG PO TABS: 5.0000 mg | ORAL_TABLET | Freq: Every day | ORAL | 1 refills | Status: DC

## 2022-05-07 MED ORDER — ATORVASTATIN CALCIUM 10 MG PO TABS: 10.0000 mg | ORAL_TABLET | Freq: Every day | ORAL | 1 refills | Status: DC
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
## 2022-05-07 NOTE — Addendum Note (Signed)
Addended by: Gomez Cleverly on: 05/07/2022 04:16 PM   Modules accepted: Orders

## 2022-05-07 NOTE — Patient Instructions (Signed)
Concussion, Adult ? ?A concussion is a brain injury from a hard, direct hit (trauma) to the head or body. This direct hit causes the brain to shake quickly back and forth inside the skull. This can damage brain cells and cause chemical changes in the brain. A concussion may also be known as a mild traumatic brain injury (TBI). ?Concussions are usually not life-threatening, but the effects of a concussion can be serious. If you have a concussion, you should be very careful to avoid having a second concussion. ?What are the causes? ?This condition is caused by: ?A direct hit to your head, such as: ?Running into another player during a game. ?Being hit in a fight. ?Hitting your head on a hard surface. ?Sudden movement of your body that causes your brain to move back and forth inside the skull, such as in a car crash. ?What are the signs or symptoms? ?The signs of a concussion can be hard to notice. Early on, they may be missed by you, family members, and health care providers. You may look fine on the outside but may act or feel differently. ?Every head injury is different. Symptoms are usually temporary but may last for days, weeks, or even months. Some symptoms appear right away, but other symptoms may not show up for hours or days. If your symptoms last longer than normal, you may have post-concussion syndrome. ?Physical symptoms ?Headaches. ?Dizziness and problems with coordination or balance. ?Sensitivity to light or noise. ?Nausea or vomiting. ?Tiredness (fatigue). ?Vision or hearing problems. ?Changes in eating or sleeping patterns. ?Seizure. ?Mental and emotional symptoms ?Irritability or mood changes. ?Memory problems. ?Trouble concentrating, organizing, or making decisions. ?Slowness in thinking, acting or reacting, speaking, or reading. ?Anxiety or depression. ?How is this diagnosed? ?This condition is diagnosed based on: ?Your symptoms. ?A description of your injury. ?You may also have tests,  including: ?Imaging tests, such as a CT scan or an MRI. ?Neuropsychological tests. These measure your thinking, understanding, learning, and remembering abilities. ?How is this treated? ?Treatment for this condition includes: ?Stopping sports or activity if you are injured. If you hit your head or show signs of concussion: ?Do not return to sports or activities the same day. ?Get checked by a health care provider before you return to your activities. ?Physical and mental rest and careful observation, usually at home. Gradually return to your normal activities. ?Medicines to help with symptoms such as headaches, nausea, or difficulty sleeping. ?Avoid taking opioid pain medicine while recovering from a concussion. ?Avoiding alcohol and drugs. These may slow your recovery and can put you at risk of further injury. ?Referral to a concussion clinic or rehabilitation center. ?Recovery from a concussion can take time. How fast you recover depends on many factors. Return to activities only when: ?Your symptoms are completely gone. ?Your health care provider says that it is safe. ?Follow these instructions at home: ?Activity ?Limit activities that require a lot of thought or concentration, such as: ?Doing homework or job-related work. ?Watching TV. ?Working on the computer or phone. ?Playing memory games and puzzles. ?Rest. Rest helps your brain heal. Make sure you: ?Get plenty of sleep. Most adults should get 7-9 hours of sleep each night. ?Rest during the day. Take naps or rest breaks when you feel tired. ?Avoid physical activity like exercise until your health care provider says it is safe. Stop any activity that worsens symptoms. ?Do not do high-risk activities that could cause a second concussion, such as riding a bike or playing   sports. ?Ask your health care provider when you can return to your normal activities, such as school, work, athletics, and driving. Your ability to react may be slower after a brain injury.  Never do these activities if you are dizzy. Your health care provider will likely give you a plan for gradually returning to activities. ?General instructions ? ?Take over-the-counter and prescription medicines only as told by your health care provider. Some medicines, such as blood thinners (anticoagulants) and aspirin, may increase the risk for complications, such as bleeding. ?Do not drink alcohol until your health care provider says you can. ?Watch your symptoms and tell others around you to do the same. Complications sometimes occur after a concussion. Older adults with a brain injury may have a higher risk of serious complications. ?Tell your work manager, teachers, school nurse, school counselor, coach, or athletic trainer about your injury, symptoms, and restrictions. ?Keep all follow-up visits as told by your health care provider. This is important. ?How is this prevented? ?Avoiding another brain injury is very important. In rare cases, another injury can lead to permanent brain damage, brain swelling, or death. The risk of this is greatest during the first 7-10 days after a head injury. Avoid injuries by: ?Stopping activities that could lead to a second concussion, such as contact or recreational sports, until your health care provider says it is okay. ?Taking these actions once you have returned to sports or activities: ?Avoiding plays or moves that can cause you to crash into another person. This is how most concussions occur. ?Following the rules and being respectful of other players. Do not engage in violent or illegal plays. ?Getting regular exercise that includes strength and balance training. ?Wearing a properly fitting helmet during sports, biking, or other activities. Helmets can help protect you from serious skull and brain injuries, but they may not protect you from a concussion. Even when wearing a helmet, you should avoid being hit in the head. ?Contact a health care provider if: ?Your  symptoms do not improve. ?You have new symptoms. ?You have another injury. ?Get help right away if: ?You have new or worsening physical symptoms, such as: ?A severe or worsening headache. ?Weakness or numbness in any part of your body, slurred speech, vision changes, or confusion. ?Your coordination gets worse. ?Vomiting repeatedly. ?You have a seizure. ?You have unusual behavior changes. ?You lose consciousness, are sleepier than normal, or are difficult to wake up. ?These symptoms may represent a serious problem that is an emergency. Do not wait to see if the symptoms will go away. Get medical help right away. Call your local emergency services (911 in the [place] Do not drive yourself to the hospital. ?Summary ?A concussion is a brain injury that results from a hard, direct hit (trauma) to your head or body. ?You may have imaging tests and neuropsychological tests to diagnose a concussion. ?Treatment for this condition includes physical and mental rest and careful observation. ?Ask your health care provider when you can return to your normal activities, such as school, work, athletics, and driving. ?Get help right away if you have a severe headache, weakness in any part of the body, seizures, behavior changes, changes in vision, or if you are confused or sleepier than normal. ?This information is not intended to replace advice given to you by your health care provider. Make sure you discuss any questions you have with your health care provider. ?Document Revised: 10/06/2020 Document Reviewed: 10/06/2020 ?Elsevier Patient Education ? 2023 [employer] ? ?

## 2022-05-11 ENCOUNTER — Ambulatory Visit: Payer: Medicare Other | Attending: Family Medicine | Admitting: *Deleted

## 2022-05-11 DIAGNOSIS — Z0000 Encounter for general adult medical examination without abnormal findings: Secondary | ICD-10-CM

## 2022-05-11 NOTE — Progress Notes (Signed)
Subjective:   Cathy [name] is a 61 y.o. female who presents for Medicare Annual (Subsequent) preventive examination.I connected with  Cathy [name] on 05/11/22 by a audio enabled telemedicine application and verified that I am speaking with the correct person using two identifiers.  Patient Location: Home  Provider Location: Office/Clinic  I discussed the limitations of evaluation and management by telemedicine. The patient expressed understanding and agreed to proceed.       Objective:    There were no vitals filed for this visit. There is no height or weight on file to calculate BMI.     04/26/2022    7:06 AM 04/22/2021    1:03 PM 03/04/2018    9:21 AM 10/30/2017    8:31 PM 07/16/2017    5:18 PM 05/20/2017    5:40 AM 05/18/2017    9:24 AM  Advanced Directives  Does Patient Have a Medical Advance Directive? No No No No No No No  Would patient like information on creating a medical advance directive?  No - Patient declined No - Patient declined No - Patient declined  No - Patient declined     Current Medications (verified) Outpatient Encounter Medications as of 05/11/2022  Medication Sig   acetaminophen (TYLENOL) 500 MG tablet Take 2 tablets (1,000 mg total) by mouth every 6 (six) hours as needed. (Patient not taking: Reported on 05/07/2022)   albuterol (VENTOLIN HFA) 108 (90 Base) MCG/ACT inhaler Inhale 1-2 puffs into the lungs every 6 (six) hours as needed for wheezing or shortness of breath.   amLODipine (NORVASC) 5 MG tablet Take 1 tablet (5 mg total) by mouth daily.   aspirin EC 81 MG tablet Take 1 tablet (81 mg total) by mouth 2 (two) times daily.   atorvastatin (LIPITOR) 10 MG tablet Take 1 tablet (10 mg total) by mouth daily.   fluticasone (FLONASE) 50 MCG/ACT nasal spray Place 2 sprays into both nostrils daily.   fluticasone (FLOVENT HFA) 44 MCG/ACT inhaler Inhale 2 puffs into the lungs 2 (two) times daily.   ibuprofen (ADVIL) 600 MG tablet Take 1 tablet (600 mg  total) by mouth every 6 (six) hours as needed.   Misc. Devices MISC CPAP, AutoPap 5 - 20 cm water.  Diagnosis-obstructive sleep apnea   nitroGLYCERIN (NITROSTAT) 0.4 MG SL tablet Place 1 tablet (0.4 mg total) under the tongue every 5 (five) minutes as needed for chest pain.   Pumpkin Seed-Soy Germ (AZO BLADDER CONTROL/GO-LESS PO) Take 1 tablet by mouth daily.   tizanidine (ZANAFLEX) 2 MG capsule Take 1 capsule (2 mg total) by mouth 3 (three) times daily.   topiramate (TOPAMAX) 50 MG tablet Take 1 tablet (50 mg total) by mouth 2 (two) times daily.   No facility-administered encounter medications on file as of 05/11/2022.    Allergies (verified) Isovue [iopamidol]   History: Past Medical History:  Diagnosis Date   Arthritis    Back pain    Cough 11/29/2020   Depression    Hyperlipidemia    Hypertension    Sleep apnea    cpap   Past Surgical History:  Procedure Laterality Date   [place]   states constipated since then   KNEE SURGERY     LEFT HEART CATH AND CORONARY ANGIOGRAPHY N/A 05/20/2017   Procedure: LEFT HEART CATH AND CORONARY ANGIOGRAPHY;  Surgeon: Leonie Man, MD;  Location: [facility];  Service: Cardiovascular;  Laterality: N/A;   Family  History  Problem Relation Age of Onset   Stroke Mother    Cancer Father    Colon cancer Father    Heart disease Brother    Colon polyps Neg Hx    Esophageal cancer Neg Hx    Rectal cancer Neg Hx    Stomach cancer Neg Hx    Social History   Socioeconomic History   Marital status: Divorced    Spouse name: Not on file   Number of children: Not on file   Years of education: Not on file   Highest education level: Not on file  Occupational History   Not on file  Tobacco Use   Smoking status: Former    Types: Cigarettes    Quit date: 1995    Years since quitting: 28.7   Smokeless tobacco: Never  Vaping Use   Vaping Use: Never used  Substance and Sexual Activity    Alcohol use: No   Drug use: No   Sexual activity: Not Currently  Other Topics Concern   Not on file  Social History Narrative   Not on file   Social Determinants of Health   Financial Resource Strain: Low Risk  (04/22/2021)   Overall Financial Resource Strain (CARDIA)    Difficulty of Paying Living Expenses: Not very hard  Food Insecurity: No Food Insecurity (04/22/2021)   Hunger Vital Sign    Worried About Running Out of Food in the Last Year: Never true    Ran Out of Food in the Last Year: Never true  Transportation Needs: No Transportation Needs (04/22/2021)   PRAPARE - Hydrologist (Medical): No    Lack of Transportation (Non-Medical): No  Physical Activity: Inactive (04/22/2021)   Exercise Vital Sign    Days of Exercise per Week: 0 days    Minutes of Exercise per Session: 0 min  Stress: Stress Concern Present (04/22/2021)   [place]    Feeling of Stress : To some extent  Social Connections: Socially Isolated (04/22/2021)   Social Connection and Isolation Panel [NHANES]    Frequency of Communication with Friends and Family: More than three times a week    Frequency of Social Gatherings with Friends and Family: Once a week    Attends Religious Services: Never    Marine scientist or Organizations: No    Attends Music therapist: Never    Marital Status: Divorced    Tobacco Counseling Counseling given: Not Answered   Clinical Intake:                 Diabetic? no         Activities of Daily Living     No data to display           Patient Care Team: Charlott Rakes, MD as PCP - General (Family Medicine)  Indicate any recent Medical Services you may have received from other than [facility] providers in the past year (date may be approximate).     Assessment:   This is a routine wellness examination for Rikia.  Hearing/Vision screen No  results found.  Dietary issues and exercise activities discussed:     Goals Addressed   None   Depression Screen    05/07/2022    3:28 PM 04/22/2021    9:10 AM 01/19/2021   10:33 AM 11/29/2020    2:27 PM 07/07/2020    8:42 AM 01/06/2020    9:36 AM  06/30/2019    9:48 AM  PHQ 2/9 Scores  PHQ - 2 Score 3 1 3 2 2 2 2   PHQ- 9 Score 13  11 17 11 15 5     Fall Risk    05/07/2022    3:23 PM 04/22/2021   12:59 PM 01/19/2021   10:30 AM 07/07/2020    8:34 AM 01/06/2020    9:36 AM  Fall Risk   Falls in the past year? 0 0 0 0 0  Number falls in past yr: 0 0 0 0   Injury with Fall? 0 0 0 0   Risk for fall due to :  No Fall Risks   History of fall(s)    FALL RISK PREVENTION PERTAINING TO THE HOME:  Any stairs in or around the home? No  If so, are there any without handrails? Yes  Home free of loose throw rugs in walkways, pet beds, electrical cords, etc? No  Adequate lighting in your home to reduce risk of falls? Yes   ASSISTIVE DEVICES UTILIZED TO PREVENT FALLS:  Life alert? Yes  Use of a cane, walker or w/c? Yes  Grab bars in the bathroom? Yes  Shower chair or bench in shower? Yes  Elevated toilet seat or a handicapped toilet? Yes   TIMED UP AND GO:  Was the test performed? No .  Length of time to ambulate     Cognitive Function:        04/22/2021    1:02 PM  6CIT Screen  What Year? 0 points  What month? 0 points  What time? 0 points  Count back from 20 2 points  Months in reverse 0 points  Repeat phrase 0 points  Total Score 2 points    Immunizations Immunization History  Administered Date(s) Administered   PFIZER(Purple Top)SARS-COV-2 Vaccination 10/30/2019, 11/24/2019   Tdap 11/12/2017    TDAP status: Up to date  Flu Vaccine status: Declined, Education has been provided regarding the importance of this vaccine but patient still declined. Advised may receive this vaccine at local pharmacy or Health Dept. Aware to provide a copy of the vaccination record if  obtained from local pharmacy or Health Dept. Verbalized acceptance and understanding.  Pneumococcal vaccine status: Declined,  Education has been provided regarding the importance of this vaccine but patient still declined. Advised may receive this vaccine at local pharmacy or Health Dept. Aware to provide a copy of the vaccination record if obtained from local pharmacy or Health Dept. Verbalized acceptance and understanding.   Covid-19 vaccine status: Completed vaccines  Qualifies for Shingles Vaccine? Yes   Zostavax completed No   Shingrix Completed?: No.    Education has been provided regarding the importance of this vaccine. Patient has been advised to call insurance company to determine out of pocket expense if they have not yet received this vaccine. Advised may also receive vaccine at local pharmacy or Health Dept. Verbalized acceptance and understanding.  Screening Tests Health Maintenance  Topic Date Due   Zoster Vaccines- Shingrix (1 of 2) Never done   COVID-19 Vaccine (3 - Pfizer series) 01/19/2020   PAP SMEAR-Modifier  03/28/2021   COLONOSCOPY (Pts 45-50yrs Insurance coverage will need to be confirmed)  10/12/2021   INFLUENZA VACCINE  Never done   MAMMOGRAM  01/31/2023   TETANUS/TDAP  11/13/2027   Hepatitis C Screening  Completed   HIV Screening  Completed   HPV VACCINES  Aged Out    Health Maintenance  Health Maintenance Due  Topic Date Due   Zoster Vaccines- Shingrix (1 of 2) Never done   COVID-19 Vaccine (3 - Pfizer series) 01/19/2020   PAP SMEAR-Modifier  03/28/2021   COLONOSCOPY (Pts 45-71yrs Insurance coverage will need to be confirmed)  10/12/2021   INFLUENZA VACCINE  Never done    Colorectal cancer screening: Referral to GI placed today, October 6th, 2023. Pt aware the office will call re: appt.  Mammogram status: Completed 01/30/2021. Repeat every year  Bone Density status: Ordered October 6th, 2023. Pt provided with contact info and advised to call to  schedule appt.  Lung Cancer Screening: (Low Dose CT Chest recommended if Age 26-80 years, 30 pack-year currently smoking OR have quit w/in 15years.) does qualify.   Lung Cancer Screening Referral: placed   Additional Screening:  Hepatitis C Screening: does not qualify; Completed July 15,2019  Vision Screening: Recommended annual ophthalmology exams for early detection of glaucoma and other disorders of the eye. Is the patient up to date with their annual eye exam?  No  Who is the provider or what is the name of the office in which the patient attends annual eye exams? [facility] If pt is not established with a provider, would they like to be referred to a provider to establish care?  Patient is established for vision exam at [facility]. She is aware that she needs to make an appointment .   Dental Screening: Recommended annual dental exams for proper oral hygiene  Community Resource Referral / Chronic Care Management: CRR required this visit?  No   CCM required this visit?  No      Plan:     I have personally reviewed and noted the following in the patient's chart:   Medical and social history Use of alcohol, tobacco or illicit drugs  Current medications and supplements including opioid prescriptions. Patient is not currently taking opioid prescriptions. Functional ability and status Nutritional status Physical activity Advanced directives List of other physicians Hospitalizations, surgeries, and ER visits in previous 12 months Vitals Screenings to include cognitive, depression, and falls Referrals and appointments  In addition, I have reviewed and discussed with patient certain preventive protocols, quality metrics, and best practice recommendations. A written personalized care plan for preventive services as well as general preventive health recommendations were provided to patient.     Carilyn Goodpasture, RN   05/11/2022  Non-face-face encounter 40 minutes Nurse Notes:    Ms. Szewczyk , Thank you for taking time to come for your Medicare Wellness Visit. I appreciate your ongoing commitment to your health goals. Please review the following plan we discussed and let me know if I can assist you in the future.   These are the goals we discussed:  Goals      Blood Pressure < 140/90        This is a list of the screening recommended for you and due dates:  Health Maintenance  Topic Date Due   COVID-19 Vaccine (3 - Pfizer series) 01/19/2020   Pap Smear  03/28/2021   Colon Cancer Screening  10/12/2021   Zoster (Shingles) Vaccine (1 of 2) 08/11/2022*   Flu Shot  11/04/2022*   Mammogram  01/31/2023   Tetanus Vaccine  11/13/2027   Hepatitis C Screening: USPSTF Recommendation to screen - Ages 18-79 yo.  Completed   HIV Screening  Completed   HPV Vaccine  Aged Out  *Topic was postponed. The date shown is not the original due date.

## 2022-05-17 ENCOUNTER — Ambulatory Visit (INDEPENDENT_AMBULATORY_CARE_PROVIDER_SITE_OTHER): Payer: Medicare Other | Admitting: Psychiatry

## 2022-05-17 VITALS — BP 146/88 | HR 90 | Ht 64.0 in | Wt 263.0 lb

## 2022-05-17 DIAGNOSIS — F0781 Postconcussional syndrome: Secondary | ICD-10-CM

## 2022-05-17 DIAGNOSIS — M542 Cervicalgia: Secondary | ICD-10-CM | POA: Diagnosis not present

## 2022-05-17 DIAGNOSIS — G43E09 Chronic migraine with aura, not intractable, without status migrainosus: Secondary | ICD-10-CM | POA: Diagnosis not present

## 2022-05-17 MED ORDER — SUMATRIPTAN SUCCINATE 50 MG PO TABS: 50.0000 mg | ORAL_TABLET | ORAL | 6 refills | Status: DC | PRN

## 2022-05-17 NOTE — Patient Instructions (Addendum)
Start sumatriptan as needed for migraines. Take at the onset of migraine. If headache recurs or does not fully resolve, you may take a second dose after 2 hours. Please avoid taking more than 2 days per week to avoid rebound headaches  Referral to physical therapy for neck pain   Post Concussive Syndrome:  Post-concussion syndrome is a complex disorder in which various symptoms -- such as headaches and dizziness -- last for weeks and sometimes months after the injury that caused the concussion. Concussion is a mild traumatic brain injury, usually occurring after a blow to the head. Loss of consciousness isn't required for a diagnosis of concussion or post-concussion syndrome. In fact, the risk of post-concussion syndrome doesn't appear to be associated with the severity of the initial injury. In most people, post-concussion syndrome symptoms occur within the first seven to 10 days and go away within three months, though they can persist for a year or more. Post-concussion syndrome treatments are aimed at easing specific symptoms.  Post-concussion symptoms include: Headaches  Dizziness  Fatigue  Irritability  Anxiety  Insomnia  Loss of concentration and memory  Noise and light sensitivity  Headaches that occur after a concussion can vary and may feel like tension-type headaches or migraines. Most, however, are tension-type headaches, which may be associated with a neck injury that happened at the same time as the head injury. In some cases, people experience behavior or emotional changes after a mild traumatic brain injury. Family members may notice that the person has become more irritable, suspicious, argumentative or stubborn. When to see a doctor See a doctor if you experience a head injury severe enough to cause confusion or amnesia -- even if you never lost consciousness. If a concussion occurs while you're playing a sport, don't go back in the game. Seek medical attention so that you  don't risk worsening your injury. Causes: Some experts believe post-concussion symptoms are caused by structural damage to the brain or disruption of neurotransmitter systems, resulting from the impact that caused the concussion. Others believe post-concussion symptoms are related to psychological factors, especially since the most common symptoms -- headache, dizziness and sleep problems -- are similar to those often experienced by people diagnosed with depression, anxiety or post-traumatic stress disorder. In many cases, both physiological effects of brain trauma and emotional reactions to these effects play a role in the development of symptoms. Researchers haven't determined why some people who've had concussions develop persistent post-concussion symptoms while others do not. No proven correlation between the severity of the injury and the likelihood of developing persistent post-concussion symptoms exists.  Risk Factors: Risk factors for developing post-concussion syndrome include: Age. Studies have found increasing age to be a risk factor for post-concussion syndrome.  Sex. Women are more likely to be diagnosed with post-concussion syndrome, but this may be because women are generally more likely to seek medical care.  Trauma. Concussions resulting from car collisions, falls, assaults and sports injuries are commonly associated with post-concussion syndrome. Treatment: There is no specific treatment for post-concussion syndrome. Instead, your doctor will treat the individual symptoms you're experiencing. The types of symptoms and their frequency are unique to each person. Headaches Medications commonly used for migraines or tension headaches, including some antidepressants, appear to be effective when these types of headaches are associated with post-concussion syndrome. Examples include: Amitriptyline. This medication has been widely used for post-traumatic injuries, as well as for symptoms  commonly associated with post-concussion syndrome, such as irritability, dizziness and depression. Topiramate.  Commonly used to treat migraines, topiramate (Qudexy XR, Topamax, Trokendi XR) may be effective in reducing headaches after head injury. Common side effects of topiramate include weight loss and cognitive problems.  Gabapentin. Gabapentin (Gralise, Neurontin) is frequently used to treat a variety of types of pain and may be helpful in treating post-traumatic headaches. A common side effect of gabapentin is drowsiness.  Other agents used to treat migraines and tension-type headaches may also be helpful in some individuals. Keep in mind that the overuse of over-the-counter and prescription pain relievers may contribute to persistent post-concussion headaches. Memory and thinking problems No medications are currently recommended specifically for the treatment of cognitive problems after mild traumatic brain injury. Time may be the best therapy for post-concussion syndrome if you have cognitive problems, as most of them go away on their own in the weeks to months following the injury. Certain forms of cognitive therapy may be helpful, including focused rehabilitation that provides training in how to use a pocket calendar, Librarian, academic or other techniques to work around memory deficits and attention skills. Relaxation therapy also may help. Dizziness Vestibular rehab (a specialized form of physical therapy can help this. Depression and anxiety The symptoms of post-concussion syndrome often improve after the affected person learns that there is a cause for his or her symptoms and that they will likely improve with time. Education about the disorder can ease a person's fears and help provide peace of mind. If you're experiencing new or increasing depression or anxiety after a concussion, some treatment options include: Psychotherapy. It may be helpful to discuss your concerns with a  psychologist or psychiatrist who has experience in working with people with brain injury.  Medication. To combat anxiety or depression, antidepressants or anti-anxiety medications may be prescribed. Prevention: The only known way to prevent post-concussion syndrome is to avoid the head injury in the first place. Avoiding head injuries Although you can't prepare for every potential situation, here are some tips for avoiding common causes of head injuries: Fasten your seat belt whenever you're traveling in a car, and be sure children are in age-appropriate safety seats. Children under 13 are safest riding in the back seat, especially if your car has air bags.  Use helmets whenever you or your children are bicycling, roller-skating, in-line skating, ice-skating, skiing, snowboarding, playing football, batting or running the bases in softball or baseball, skateboarding, or horseback riding. Wear a helmet when riding a motorcycle.  Take steps around the house to prevent falls, such as removing small area rugs, improving lighting and installing handrails.
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
## 2022-05-30 NOTE — Patient Instructions (Signed)

## 2022-05-31 ENCOUNTER — Ambulatory Visit: Payer: Medicare Other | Admitting: Physical Therapy

## 2022-06-08 ENCOUNTER — Other Ambulatory Visit: Payer: Self-pay | Admitting: Family Medicine

## 2022-06-08 DIAGNOSIS — Z1211 Encounter for screening for malignant neoplasm of colon: Secondary | ICD-10-CM

## 2022-06-13 ENCOUNTER — Encounter: Payer: Self-pay | Admitting: Family Medicine

## 2022-06-13 ENCOUNTER — Ambulatory Visit: Payer: Medicare Other | Attending: Family Medicine | Admitting: Family Medicine

## 2022-06-13 VITALS — BP 143/96 | HR 66 | Wt 267.6 lb

## 2022-06-13 DIAGNOSIS — G8929 Other chronic pain: Secondary | ICD-10-CM

## 2022-06-13 DIAGNOSIS — M62838 Other muscle spasm: Secondary | ICD-10-CM | POA: Diagnosis not present

## 2022-06-13 DIAGNOSIS — R519 Headache, unspecified: Secondary | ICD-10-CM | POA: Diagnosis not present

## 2022-06-13 MED ORDER — TIZANIDINE HCL 2 MG PO CAPS: 4.0000 mg | ORAL_CAPSULE | Freq: Three times a day (TID) | ORAL | 0 refills | Status: DC | PRN

## 2022-06-13 MED ORDER — TOPIRAMATE 100 MG PO TABS: 100.0000 mg | ORAL_TABLET | Freq: Two times a day (BID) | ORAL | 1 refills | Status: DC
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
## 2022-06-13 NOTE — Patient Instructions (Signed)
Spasticity Spasticity is a condition in which your muscles contract suddenly and unpredictably (spasm). Spasticity usually affects your arms, legs, or back. It can also affect the way you walk. Spasticity can range from mild muscle stiffness and tightness to severe, uncontrollable muscle spasms. Severe spasticity can be painful and can freeze your muscles in an uncomfortable position. Follow these instructions at home: Managing muscle stiffness and spasms     Wear a brace as told by your health care provider to prevent muscle contractions. Have the affected muscles massaged. If directed, apply heat to the affected muscle area. Use the heat source that your health care provider recommends, such as a moist heat pack or heating pad. Place a towel between your skin and the heat source. Leave the heat on for 20-30 minutes. Remove the heat if your skin turns bright red. This is especially important if you are unable to feel pain, heat, or cold. You may have a greater risk of getting burned. If directed, apply ice to the affected muscle area: Put ice in a plastic bag. Place a towel between your skin and the bag or between your brace and the bag. Leave the ice on for 20 minutes, 2?3 times a day. Activity Stay active as directed by your health care provider. Find a safe exercise program that fits your needs and ability. Maintain good posture when walking and sitting. Work with a physical therapist to learn exercises that will stretch and strengthen your muscles. Do stretching and range-of-motion exercises at home as told by a physical therapist. Work with an occupational therapist. This type of health care provider can help you function better at home and at work. If you have severe spasticity, use mobility aids, such as a walker or cane, as told by your health care provider. General instructions Watch your condition for any changes. Wear loose, comfortable clothing that does not restrict your  movement. Wear closed-toe shoes that fit well and support your feet. Wear shoes that have rubber soles or low heels. Take over-the-counter and prescription medicine only as told by your health care provider. Keep all follow-up visits as told by your health care provider. This is important. Contact a health care provider if you: Have worsening muscle spasms. Develop other symptoms along with spasticity. Have a fever or chills. Experience a burning feeling when you pass urine. Become constipated. Need more support at home. Get help right away if you: Have trouble breathing. Have a muscle spasm that freezes you into a painful position. Cannot walk. Cannot care for yourself at home. Have trouble passing urine or have urinary incontinence. Summary Spasticity is a condition in which your muscles contract suddenly and unpredictably (spasm). Spasticity usually affects your arms, legs, or back. Spasticity can range from mild muscle stiffness and tightness to severe, uncontrollable muscle spasms. Do stretching and range-of-motion exercises at home as told by a physical therapist. Take over-the-counter and prescription medicine only as told by your health care provider. This information is not intended to replace advice given to you by your health care provider. Make sure you discuss any questions you have with your health care provider. Document Revised: 02/10/2021 Document Reviewed: 02/10/2021 Elsevier Patient Education  [place].

## 2022-06-21 ENCOUNTER — Other Ambulatory Visit: Payer: Self-pay | Admitting: Pharmacist

## 2022-06-21 MED ORDER — TIZANIDINE HCL 4 MG PO TABS: ORAL_TABLET | ORAL | 0 refills | Status: DC

## 2022-06-25 DIAGNOSIS — G4733 Obstructive sleep apnea (adult) (pediatric): Secondary | ICD-10-CM | POA: Diagnosis not present

## 2022-07-05 ENCOUNTER — Ambulatory Visit (AMBULATORY_SURGERY_CENTER): Payer: Self-pay | Admitting: *Deleted

## 2022-07-05 VITALS — Ht 65.0 in | Wt 263.0 lb

## 2022-07-05 DIAGNOSIS — Z800 Family history of malignant neoplasm of digestive organs: Secondary | ICD-10-CM

## 2022-07-05 DIAGNOSIS — Z86010 Personal history of colonic polyps: Secondary | ICD-10-CM

## 2022-07-05 MED ORDER — NA SULFATE-K SULFATE-MG SULF 17.5-3.13-1.6 GM/177ML PO SOLN: 1.0000 | Freq: Once | ORAL | 0 refills | Status: AC

## 2022-07-05 NOTE — Progress Notes (Signed)

## 2022-07-24 ENCOUNTER — Encounter: Payer: Self-pay | Admitting: Gastroenterology

## 2022-07-25 ENCOUNTER — Encounter: Payer: Self-pay | Admitting: Gastroenterology

## 2022-07-25 ENCOUNTER — Ambulatory Visit (AMBULATORY_SURGERY_CENTER): Payer: Medicare Other | Admitting: Gastroenterology

## 2022-07-25 VITALS — BP 149/76 | HR 57 | Temp 97.3°F | Resp 12 | Ht 65.0 in | Wt 263.0 lb

## 2022-07-25 DIAGNOSIS — D123 Benign neoplasm of transverse colon: Secondary | ICD-10-CM | POA: Diagnosis not present

## 2022-07-25 DIAGNOSIS — Z800 Family history of malignant neoplasm of digestive organs: Secondary | ICD-10-CM

## 2022-07-25 DIAGNOSIS — F32A Depression, unspecified: Secondary | ICD-10-CM | POA: Diagnosis not present

## 2022-07-25 DIAGNOSIS — K635 Polyp of colon: Secondary | ICD-10-CM | POA: Diagnosis not present

## 2022-07-25 DIAGNOSIS — G4730 Sleep apnea, unspecified: Secondary | ICD-10-CM | POA: Diagnosis not present

## 2022-07-25 DIAGNOSIS — Z86010 Personal history of colonic polyps: Secondary | ICD-10-CM | POA: Diagnosis not present

## 2022-07-25 DIAGNOSIS — Z09 Encounter for follow-up examination after completed treatment for conditions other than malignant neoplasm: Secondary | ICD-10-CM | POA: Diagnosis not present

## 2022-07-25 DIAGNOSIS — I10 Essential (primary) hypertension: Secondary | ICD-10-CM | POA: Diagnosis not present

## 2022-07-25 MED ORDER — SODIUM CHLORIDE 0.9 % IV SOLN: 500.0000 mL | Freq: Once | INTRAVENOUS | Status: DC
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
## 2022-07-25 NOTE — Op Note (Addendum)
[place] Patient Name: Cathy [name] Procedure Date: 07/25/2022 11:22 AM MRN: [id number] Endoscopist: Mauri Pole , MD, [id number] Age: 61 Referring MD:  Date of Birth: 19-Sep-1960 Gender: Female Account #: [id number] Procedure:                Colonoscopy Indications:              Screening in patient at increased risk: Family                            history of 1st-degree relative with colorectal                            cancer, High risk colon cancer surveillance:                            Personal history of colonic polyps, High risk colon                            cancer surveillance: Personal history of adenoma                            (10 mm or greater in size), High risk colon cancer                            surveillance: Personal history of multiple (3 or                            more) adenomas Medicines:                Monitored Anesthesia Care Procedure:                Pre-Anesthesia Assessment:                           - Prior to the procedure, a History and Physical                            was performed, and patient medications and                            allergies were reviewed. The patient's tolerance of                            previous anesthesia was also reviewed. The risks                            and benefits of the procedure and the sedation                            options and risks were discussed with the patient.                            All questions were answered, and informed consent  was obtained. Prior Anticoagulants: The patient has                            taken no anticoagulant or antiplatelet agents. ASA                            Grade Assessment: II - A patient with mild systemic                            disease. After reviewing the risks and benefits,                            the patient was deemed in satisfactory condition to                            undergo the  procedure.                           After obtaining informed consent, the colonoscope                            was passed under direct vision. Throughout the                            procedure, the patient's blood pressure, pulse, and                            oxygen saturations were monitored continuously. The                            PCF-HQ190L Colonoscope was introduced through the                            anus and advanced to the the cecum, identified by                            appendiceal orifice and ileocecal valve. The                            colonoscopy was performed without difficulty. The                            patient tolerated the procedure well. The quality                            of the bowel preparation was good. The ileocecal                            valve, appendiceal orifice, and rectum were                            photographed. Scope In: 11:32:47 AM Scope Out: 27:51:70 AM Scope Withdrawal Time: 0 hours 9 minutes 40 seconds  Total Procedure Duration: 0 hours 14 minutes  11 seconds  Findings:                 The perianal and digital rectal examinations were                            normal.                           A 4 mm polyp was found in the transverse colon. The                            polyp was sessile. The polyp was removed with a                            cold snare. Resection and retrieval were complete.                           Scattered small-mouthed diverticula were found in                            the sigmoid colon, descending colon, transverse                            colon and ascending colon.                           Non-bleeding external and internal hemorrhoids were                            found during retroflexion. The hemorrhoids were                            medium-sized. Complications:            No immediate complications. Estimated Blood Loss:     Estimated blood loss was minimal. Impression:                - One 4 mm polyp in the transverse colon, removed                            with a cold snare. Resected and retrieved.                           - Diverticulosis in the sigmoid colon, in the                            descending colon, in the transverse colon and in                            the ascending colon.                           - Non-bleeding external and internal hemorrhoids. Recommendation:           - Patient has a contact number available for  emergencies. The signs and symptoms of potential                            delayed complications were discussed with the                            patient. Return to normal activities tomorrow.                            Written discharge instructions were provided to the                            patient.                           - Resume previous diet.                           - Continue present medications.                           - Await pathology results.                           - Repeat colonoscopy in 5 years for surveillance                            based on pathology results. Mauri Pole, MD 07/25/2022 11:54:03 AM This report has been signed electronically.

## 2022-07-25 NOTE — Progress Notes (Signed)
Called to room to assist during endoscopic procedure.  Patient ID and intended procedure confirmed with present staff. Received instructions for my participation in the procedure from the performing physician.  

## 2022-07-25 NOTE — Progress Notes (Signed)
Report to pacu rn. Vss. Care resumed by rn. 

## 2022-07-25 NOTE — Progress Notes (Signed)
VS by DT  Pt's states no medical or surgical changes since previsit or office visit.  

## 2022-07-25 NOTE — Patient Instructions (Signed)
Handouts provided on polyps, diverticulosis and hemorrhoids.   Resume previous diet. Continue present medications.  Await pathology results.  Repeat colonoscopy in 5 years for surveillance based on pathology results.   YOU HAD AN ENDOSCOPIC PROCEDURE TODAY AT [facility]:   Refer to the procedure report that was given to you for any specific questions about what was found during the examination.  If the procedure report does not answer your questions, please call your gastroenterologist to clarify.  If you requested that your care partner not be given the details of your procedure findings, then the procedure report has been included in a sealed envelope for you to review at your convenience later.  YOU SHOULD EXPECT: Some feelings of bloating in the abdomen. Passage of more gas than usual.  Walking can help get rid of the air that was put into your GI tract during the procedure and reduce the bloating. If you had a lower endoscopy (such as a colonoscopy or flexible sigmoidoscopy) you may notice spotting of blood in your stool or on the toilet paper. If you underwent a bowel prep for your procedure, you may not have a normal bowel movement for a few days.  Please Note:  You might notice some irritation and congestion in your nose or some drainage.  This is from the oxygen used during your procedure.  There is no need for concern and it should clear up in a day or so.  SYMPTOMS TO REPORT IMMEDIATELY:  Following lower endoscopy (colonoscopy or flexible sigmoidoscopy):  Excessive amounts of blood in the stool  Significant tenderness or worsening of abdominal pains  Swelling of the abdomen that is new, acute  Fever of 100F or higher  For urgent or emergent issues, a gastroenterologist can be reached at any hour by calling [phone]. Do not use MyChart messaging for urgent concerns.    DIET:  We do recommend a small meal at first, but then you may proceed to your regular  diet.  Drink plenty of fluids but you should avoid alcoholic beverages for 24 hours.  ACTIVITY:  You should plan to take it easy for the rest of today and you should NOT DRIVE or use heavy machinery until tomorrow (because of the sedation medicines used during the test).    FOLLOW UP: Our staff will call the number listed on your records the next business day following your procedure.  We will call around 7:15- 8:00 am to check on you and address any questions or concerns that you may have regarding the information given to you following your procedure. If we do not reach you, we will leave a message.     If any biopsies were taken you will be contacted by phone or by letter within the next 1-3 weeks.  Please call us at [phone] if you have not heard about the biopsies in 3 weeks.    SIGNATURES/CONFIDENTIALITY: You and/or your care partner have signed paperwork which will be entered into your electronic medical record.  These signatures attest to the fact that that the information above on your After Visit Summary has been reviewed and is understood.  Full responsibility of the confidentiality of this discharge information lies with you and/or your care-partner.

## 2022-07-26 ENCOUNTER — Telehealth: Payer: Self-pay

## 2022-07-26 NOTE — Telephone Encounter (Signed)
  Follow up Call-     07/25/2022   10:32 AM  Call back number  Post procedure Call Back phone  # [phone]  Permission to leave phone message Yes     Patient questions:  Do you have a fever, pain , or abdominal swelling? No. Pain Score  0 *  Have you tolerated food without any problems? Yes.    Have you been able to return to your normal activities? Yes.    Do you have any questions about your discharge instructions: Diet   No. Medications  No. Follow up visit  No.  Do you have questions or concerns about your Care? No.  Actions: * If pain score is 4 or above: No action needed, pain <4.

## 2022-08-02 ENCOUNTER — Encounter: Payer: Self-pay | Admitting: Gastroenterology

## 2022-08-28 ENCOUNTER — Ambulatory Visit: Payer: 59 | Admitting: Neurology

## 2022-09-27 DIAGNOSIS — G4733 Obstructive sleep apnea (adult) (pediatric): Secondary | ICD-10-CM | POA: Diagnosis not present

## 2022-11-06 ENCOUNTER — Ambulatory Visit: Payer: 59 | Attending: Family Medicine | Admitting: Family Medicine

## 2022-11-06 ENCOUNTER — Encounter: Payer: Self-pay | Admitting: Family Medicine

## 2022-11-06 VITALS — BP 164/114 | HR 84 | Ht 64.0 in | Wt 258.0 lb

## 2022-11-06 DIAGNOSIS — R0609 Other forms of dyspnea: Secondary | ICD-10-CM

## 2022-11-06 DIAGNOSIS — R051 Acute cough: Secondary | ICD-10-CM

## 2022-11-06 DIAGNOSIS — R7303 Prediabetes: Secondary | ICD-10-CM

## 2022-11-06 DIAGNOSIS — E785 Hyperlipidemia, unspecified: Secondary | ICD-10-CM

## 2022-11-06 DIAGNOSIS — I10 Essential (primary) hypertension: Secondary | ICD-10-CM | POA: Diagnosis not present

## 2022-11-06 MED ORDER — FLUTICASONE PROPIONATE HFA 44 MCG/ACT IN AERO: 2.0000 | INHALATION_SPRAY | Freq: Two times a day (BID) | RESPIRATORY_TRACT | 2 refills | Status: AC

## 2022-11-06 MED ORDER — ATORVASTATIN CALCIUM 10 MG PO TABS
10.0000 mg | ORAL_TABLET | Freq: Every day | ORAL | 1 refills | Status: DC
Start: 2022-11-06 — End: 2022-11-08

## 2022-11-06 MED ORDER — PREDNISONE 20 MG PO TABS
20.0000 mg | ORAL_TABLET | Freq: Every day | ORAL | 0 refills | Status: DC
Start: 2022-11-06 — End: 2023-02-13

## 2022-11-06 MED ORDER — CETIRIZINE HCL 10 MG PO TABS
10.0000 mg | ORAL_TABLET | Freq: Every day | ORAL | 1 refills | Status: AC
Start: 2022-11-06 — End: ?

## 2022-11-06 MED ORDER — AMLODIPINE BESYLATE 10 MG PO TABS
10.0000 mg | ORAL_TABLET | Freq: Every day | ORAL | 1 refills | Status: DC
Start: 2022-11-06 — End: 2023-05-28
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
## 2022-11-06 NOTE — Progress Notes (Signed)
Coughing for 4 days.

## 2022-11-06 NOTE — Patient Instructions (Signed)
Cough, Adult Coughing is a reflex that clears your throat and airways (respiratory system). It helps heal and protect your lungs. It is normal to cough from time to time. A cough that happens with other symptoms or that lasts a long time may be a sign of a condition that needs treatment. A short-term (acute) cough may only last 2-3 weeks. A long-term (chronic) cough may last 8 or more weeks. Coughing is often caused by: Diseases, such as: An infection of the respiratory system. Asthma or other heart or lung diseases. Gastroesophageal reflux. This is when acid comes back up from the stomach. Breathing in things that irritate your lungs. Allergies. Postnasal drip. This is when mucus runs down the back of your throat. Smoking. Some medicines. Follow these instructions at home: Medicines Take over-the-counter and prescription medicines only as told by your health care provider. Talk with your provider before you take cough medicine (cough suppressants). Eating and drinking Do not drink alcohol. Avoid caffeine. Drink enough fluid to keep your pee (urine) pale yellow. Lifestyle Avoid cigarette smoke. Do not use any products that contain nicotine or tobacco. These products include cigarettes, chewing tobacco, and vaping devices, such as e-cigarettes. If you need help quitting, ask your provider. Avoid things that make you cough. These may include perfumes, candles, cleaning products, or campfire smoke. General instructions  Watch for any changes to your cough. Tell your provider about them. Always cover your mouth when you cough. If the air is dry in your bedroom or home, use a cool mist vaporizer or humidifier. If your cough is worse at night, try to sleep in a semi-upright position. Rest as needed. Contact a health care provider if: You have new symptoms, or your symptoms get worse. You cough up pus. You have a fever that does not go away or a cough that does not get better after 2-3  weeks. You cannot control your cough with medicine, and you are losing sleep. You have pain that gets worse or is not helped with medicine. You lose weight for no clear reason. You have night sweats. Get help right away if: You cough up blood. You have trouble breathing. Your heart is beating very fast. These symptoms may be an emergency. Get help right away. Call 911. Do not wait to see if the symptoms will go away. Do not drive yourself to the hospital. This information is not intended to replace advice given to you by your health care provider. Make sure you discuss any questions you have with your health care provider. Document Revised: 03/23/2022 Document Reviewed: 03/23/2022 Elsevier Patient Education  2023 Elsevier Inc.  

## 2022-11-07 ENCOUNTER — Ambulatory Visit: Payer: 59 | Attending: Family Medicine

## 2022-11-07 DIAGNOSIS — R7303 Prediabetes: Secondary | ICD-10-CM | POA: Diagnosis not present

## 2022-11-07 DIAGNOSIS — I10 Essential (primary) hypertension: Secondary | ICD-10-CM

## 2022-11-08 ENCOUNTER — Other Ambulatory Visit: Payer: Self-pay | Admitting: Family Medicine

## 2022-11-08 DIAGNOSIS — E785 Hyperlipidemia, unspecified: Secondary | ICD-10-CM

## 2022-11-08 LAB — CBC WITH DIFFERENTIAL/PLATELET
Basophils Absolute: 0 10*3/uL (ref 0.0–0.2)
Basos: 0 %
EOS (ABSOLUTE): 0 10*3/uL (ref 0.0–0.4)
Eos: 0 %
Hematocrit: 40.2 % (ref 34.0–46.6)
Hemoglobin: 12.9 g/dL (ref 11.1–15.9)
Immature Grans (Abs): 0 10*3/uL (ref 0.0–0.1)
Immature Granulocytes: 1 %
Lymphocytes Absolute: 2.9 10*3/uL (ref 0.7–3.1)
Lymphs: 34 %
MCH: 27.8 pg (ref 26.6–33.0)
MCHC: 32.1 g/dL (ref 31.5–35.7)
MCV: 87 fL (ref 79–97)
Monocytes Absolute: 0.6 10*3/uL (ref 0.1–0.9)
Monocytes: 7 %
Neutrophils Absolute: 5.1 10*3/uL (ref 1.4–7.0)
Neutrophils: 58 %
Platelets: 312 10*3/uL (ref 150–450)
RBC: 4.64 x10E6/uL (ref 3.77–5.28)
RDW: 14.2 % (ref 11.7–15.4)
WBC: 8.7 10*3/uL (ref 3.4–10.8)

## 2022-11-08 LAB — HEMOGLOBIN A1C
Est. average glucose Bld gHb Est-mCnc: 126 mg/dL
Hgb A1c MFr Bld: 6 % — ABNORMAL HIGH (ref 4.8–5.6)

## 2022-11-08 LAB — CMP14+EGFR
ALT: 14 IU/L (ref 0–32)
AST: 19 IU/L (ref 0–40)
Albumin/Globulin Ratio: 1.5 (ref 1.2–2.2)
Albumin: 4.7 g/dL (ref 3.8–4.9)
Alkaline Phosphatase: 102 IU/L (ref 44–121)
BUN/Creatinine Ratio: 14 (ref 9–23)
BUN: 13 mg/dL (ref 6–24)
Bilirubin Total: 0.3 mg/dL (ref 0.0–1.2)
CO2: 24 mmol/L (ref 20–29)
Calcium: 10.4 mg/dL — ABNORMAL HIGH (ref 8.7–10.2)
Chloride: 106 mmol/L (ref 96–106)
Creatinine, Ser: 0.93 mg/dL (ref 0.57–1.00)
Globulin, Total: 3.1 g/dL (ref 1.5–4.5)
Glucose: 88 mg/dL (ref 70–99)
Potassium: 4.5 mmol/L (ref 3.5–5.2)
Sodium: 144 mmol/L (ref 134–144)
Total Protein: 7.8 g/dL (ref 6.0–8.5)
eGFR: 71 mL/min/{1.73_m2} (ref >59)

## 2022-11-08 LAB — LP+NON-HDL CHOLESTEROL
Cholesterol, Total: 212 mg/dL — ABNORMAL HIGH (ref 100–199)
HDL: 76 mg/dL (ref >39)
LDL Chol Calc (NIH): 121 mg/dL — ABNORMAL HIGH (ref 0–99)
Total Non-HDL-Chol (LDL+VLDL): 136 mg/dL — ABNORMAL HIGH (ref 0–129)
Triglycerides: 85 mg/dL (ref 0–149)
VLDL Cholesterol Cal: 15 mg/dL (ref 5–40)

## 2022-11-08 MED ORDER — ATORVASTATIN CALCIUM 20 MG PO TABS
20.0000 mg | ORAL_TABLET | Freq: Every day | ORAL | 1 refills | Status: DC
Start: 2022-11-08 — End: 2023-05-28

## 2022-11-09 ENCOUNTER — Other Ambulatory Visit: Payer: Self-pay

## 2022-11-09 DIAGNOSIS — R0609 Other forms of dyspnea: Secondary | ICD-10-CM

## 2022-11-09 MED ORDER — ALBUTEROL SULFATE HFA 108 (90 BASE) MCG/ACT IN AERS
1.0000 | INHALATION_SPRAY | Freq: Four times a day (QID) | RESPIRATORY_TRACT | 1 refills | Status: DC | PRN
Start: 2022-11-09 — End: 2023-10-09

## 2022-11-26 ENCOUNTER — Telehealth: Payer: Self-pay

## 2022-11-26 NOTE — Telephone Encounter (Signed)
Copied from CRM [id number]. Topic: General - Other >> Nov 26, 2022 12:08 PM Everette C wrote: Reason for CRM: The patient would like to speak with a member of clinical staff regarding their current living situation and a previously discussed referral for housing   The patient would like to reside in a smoke free facility  Please contact the patient further when possible

## 2022-11-27 ENCOUNTER — Ambulatory Visit: Payer: Self-pay

## 2022-11-27 NOTE — Telephone Encounter (Signed)
FYI

## 2022-11-28 NOTE — Telephone Encounter (Signed)
Done

## 2022-11-29 NOTE — Telephone Encounter (Signed)
Letter received from Dr Alvis Lemmings.  I called patient and left her message that the letter is ready for pick up at the clinic front desk.

## 2022-12-12 ENCOUNTER — Ambulatory Visit: Payer: 59 | Attending: Family Medicine | Admitting: Family Medicine

## 2022-12-12 ENCOUNTER — Encounter: Payer: Self-pay | Admitting: Family Medicine

## 2022-12-12 ENCOUNTER — Other Ambulatory Visit (HOSPITAL_COMMUNITY)
Admission: RE | Admit: 2022-12-12 | Discharge: 2022-12-12 | Disposition: A | Discharge status: Home / Self Care | Payer: 59 | Source: Ambulatory Visit | Attending: Family Medicine | Admitting: Family Medicine

## 2022-12-12 VITALS — BP 163/98 | HR 73 | Temp 98.6°F | Ht 64.0 in | Wt 255.4 lb

## 2022-12-12 DIAGNOSIS — Z01419 Encounter for gynecological examination (general) (routine) without abnormal findings: Secondary | ICD-10-CM | POA: Diagnosis not present

## 2022-12-12 DIAGNOSIS — Z1151 Encounter for screening for human papillomavirus (HPV): Secondary | ICD-10-CM | POA: Insufficient documentation

## 2022-12-12 DIAGNOSIS — I10 Essential (primary) hypertension: Secondary | ICD-10-CM

## 2022-12-12 DIAGNOSIS — Z1231 Encounter for screening mammogram for malignant neoplasm of breast: Secondary | ICD-10-CM

## 2022-12-12 DIAGNOSIS — Z124 Encounter for screening for malignant neoplasm of cervix: Secondary | ICD-10-CM | POA: Diagnosis present

## 2022-12-12 NOTE — Patient Instructions (Signed)
Managing Your Hypertension Hypertension, also called high blood pressure, is when the force of the blood pressing against the walls of the arteries is too strong. Arteries are blood vessels that carry blood from your heart throughout your body. Hypertension forces the heart to work harder to pump blood and may cause the arteries to become narrow or stiff. Understanding blood pressure readings A blood pressure reading includes a higher number over a lower number: The first, or top, number is called the systolic pressure. It is a measure of the pressure in your arteries as your heart beats. The second, or bottom number, is called the diastolic pressure. It is a measure of the pressure in your arteries as the heart relaxes. For most people, a normal blood pressure is below 120/80. Your personal target blood pressure may vary depending on your medical conditions, your age, and other factors. Blood pressure is classified into four stages. Based on your blood pressure reading, your health care provider may use the following stages to determine what type of treatment you need, if any. Systolic pressure and diastolic pressure are measured in a unit called millimeters of mercury (mmHg). Normal Systolic pressure: below 120. Diastolic pressure: below 80. Elevated Systolic pressure: 120-129. Diastolic pressure: below 80. Hypertension stage 1 Systolic pressure: 130-139. Diastolic pressure: 80-89. Hypertension stage 2 Systolic pressure: 140 or above. Diastolic pressure: 90 or above. How can this condition affect me? Managing your hypertension is very important. Over time, hypertension can damage the arteries and decrease blood flow to parts of the body, including the brain, heart, and kidneys. Having untreated or uncontrolled hypertension can lead to: A heart attack. A stroke. A weakened blood vessel (aneurysm). Heart failure. Kidney damage. Eye damage. Memory and concentration problems. Vascular  dementia. What actions can I take to manage this condition? Hypertension can be managed by making lifestyle changes and possibly by taking medicines. Your health care provider will help you make a plan to bring your blood pressure within a normal range. You may be referred for counseling on a healthy diet and physical activity. Nutrition  Eat a diet that is high in fiber and potassium, and low in salt (sodium), added sugar, and fat. An example eating plan is called the DASH diet. DASH stands for Dietary Approaches to Stop Hypertension. To eat this way: Eat plenty of fresh fruits and vegetables. Try to fill one-half of your plate at each meal with fruits and vegetables. Eat whole grains, such as whole-wheat pasta, brown rice, or whole-grain bread. Fill about one-fourth of your plate with whole grains. Eat low-fat dairy products. Avoid fatty cuts of meat, processed or cured meats, and poultry with skin. Fill about one-fourth of your plate with lean proteins such as fish, chicken without skin, beans, eggs, and tofu. Avoid pre-made and processed foods. These tend to be higher in sodium, added sugar, and fat. Reduce your daily sodium intake. Many people with hypertension should eat less than 1,500 mg of sodium a day. Lifestyle  Work with your health care provider to maintain a healthy body weight or to lose weight. Ask what an ideal weight is for you. Get at least 30 minutes of exercise that causes your heart to beat faster (aerobic exercise) most days of the week. Activities may include walking, swimming, or biking. Include exercise to strengthen your muscles (resistance exercise), such as weight lifting, as part of your weekly exercise routine. Try to do these types of exercises for 30 minutes at least 3 days a week. Do   not use any products that contain nicotine or tobacco. These products include cigarettes, chewing tobacco, and vaping devices, such as e-cigarettes. If you need help quitting, ask your  health care provider. Control any long-term (chronic) conditions you have, such as high cholesterol or diabetes. Identify your sources of stress and find ways to manage stress. This may include meditation, deep breathing, or making time for fun activities. Alcohol use Do not drink alcohol if: Your health care provider tells you not to drink. You are pregnant, may be pregnant, or are planning to become pregnant. If you drink alcohol: Limit how much you have to: 0-1 drink a day for women. 0-2 drinks a day for men. Know how much alcohol is in your drink. In the [place] one drink equals one 12 oz bottle of beer (355 mL), one 5 oz glass of wine (148 mL), or one 1 oz glass of hard liquor (44 mL). Medicines Your health care provider may prescribe medicine if lifestyle changes are not enough to get your blood pressure under control and if: Your systolic blood pressure is 130 or higher. Your diastolic blood pressure is 80 or higher. Take medicines only as told by your health care provider. Follow the directions carefully. Blood pressure medicines must be taken as told by your health care provider. The medicine does not work as well when you skip doses. Skipping doses also puts you at risk for problems. Monitoring Before you monitor your blood pressure: Do not smoke, drink caffeinated beverages, or exercise within 30 minutes before taking a measurement. Use the bathroom and empty your bladder (urinate). Sit quietly for at least 5 minutes before taking measurements. Monitor your blood pressure at home as told by your health care provider. To do this: Sit with your back straight and supported. Place your feet flat on the floor. Do not cross your legs. Support your arm on a flat surface, such as a table. Make sure your upper arm is at heart level. Each time you measure, take two or three readings one minute apart and record the results. You may also need to have your blood pressure checked regularly by  your health care provider. General information Talk with your health care provider about your diet, exercise habits, and other lifestyle factors that may be contributing to hypertension. Review all the medicines you take with your health care provider because there may be side effects or interactions. Keep all follow-up visits. Your health care provider can help you create and adjust your plan for managing your high blood pressure. Where to find more information National Heart, Lung, and Blood Institute: www.nhlbi.nih.gov American Heart Association: www.heart.org Contact a health care provider if: You think you are having a reaction to medicines you have taken. You have repeated (recurrent) headaches. You feel dizzy. You have swelling in your ankles. You have trouble with your vision. Get help right away if: You develop a severe headache or confusion. You have unusual weakness or numbness, or you feel faint. You have severe pain in your chest or abdomen. You vomit repeatedly. You have trouble breathing. These symptoms may be an emergency. Get help right away. Call 911. Do not wait to see if the symptoms will go away. Do not drive yourself to the hospital. Summary Hypertension is when the force of blood pumping through your arteries is too strong. If this condition is not controlled, it may put you at risk for serious complications. Your personal target blood pressure may vary depending on your medical conditions,   your age, and other factors. For most people, a normal blood pressure is less than 120/80. Hypertension is managed by lifestyle changes, medicines, or both. Lifestyle changes to help manage hypertension include losing weight, eating a healthy, low-sodium diet, exercising more, stopping smoking, and limiting alcohol. This information is not intended to replace advice given to you by your health care provider. Make sure you discuss any questions you have with your health care  provider. Document Revised: 04/06/2021 Document Reviewed: 04/06/2021 Elsevier Patient Education  2023 Elsevier Inc.  

## 2022-12-12 NOTE — Progress Notes (Signed)
PAP
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
## 2022-12-14 LAB — CYTOLOGY - PAP
Comment: NEGATIVE
Diagnosis: NEGATIVE
High risk HPV: NEGATIVE

## 2022-12-26 DIAGNOSIS — G4733 Obstructive sleep apnea (adult) (pediatric): Secondary | ICD-10-CM | POA: Diagnosis not present

## 2023-02-12 ENCOUNTER — Ambulatory Visit: Payer: Self-pay | Admitting: *Deleted

## 2023-02-12 ENCOUNTER — Emergency Department (HOSPITAL_COMMUNITY): Payer: 59

## 2023-02-12 ENCOUNTER — Other Ambulatory Visit: Payer: Self-pay

## 2023-02-12 ENCOUNTER — Emergency Department (HOSPITAL_COMMUNITY)
Admission: EM | Admit: 2023-02-12 | Discharge: 2023-02-12 | Disposition: A | Discharge status: Home / Self Care | Payer: 59 | Attending: Emergency Medicine | Admitting: Emergency Medicine

## 2023-02-12 ENCOUNTER — Encounter (HOSPITAL_COMMUNITY): Payer: Self-pay | Admitting: Emergency Medicine

## 2023-02-12 DIAGNOSIS — M47816 Spondylosis without myelopathy or radiculopathy, lumbar region: Secondary | ICD-10-CM | POA: Diagnosis not present

## 2023-02-12 DIAGNOSIS — Z7982 Long term (current) use of aspirin: Secondary | ICD-10-CM | POA: Insufficient documentation

## 2023-02-12 DIAGNOSIS — I878 Other specified disorders of veins: Secondary | ICD-10-CM | POA: Diagnosis not present

## 2023-02-12 DIAGNOSIS — R32 Unspecified urinary incontinence: Secondary | ICD-10-CM | POA: Insufficient documentation

## 2023-02-12 DIAGNOSIS — Z79899 Other long term (current) drug therapy: Secondary | ICD-10-CM | POA: Diagnosis not present

## 2023-02-12 DIAGNOSIS — M4804 Spinal stenosis, thoracic region: Secondary | ICD-10-CM | POA: Diagnosis not present

## 2023-02-12 DIAGNOSIS — M5126 Other intervertebral disc displacement, lumbar region: Secondary | ICD-10-CM | POA: Diagnosis not present

## 2023-02-12 DIAGNOSIS — M545 Low back pain, unspecified: Secondary | ICD-10-CM | POA: Insufficient documentation

## 2023-02-12 DIAGNOSIS — M5136 Other intervertebral disc degeneration, lumbar region: Secondary | ICD-10-CM | POA: Diagnosis not present

## 2023-02-12 DIAGNOSIS — M47814 Spondylosis without myelopathy or radiculopathy, thoracic region: Secondary | ICD-10-CM | POA: Diagnosis not present

## 2023-02-12 DIAGNOSIS — M47817 Spondylosis without myelopathy or radiculopathy, lumbosacral region: Secondary | ICD-10-CM | POA: Diagnosis not present

## 2023-02-12 LAB — BASIC METABOLIC PANEL
Anion gap: 9 (ref 5–15)
BUN: 8 mg/dL (ref 6–20)
CO2: 24 mmol/L (ref 22–32)
Calcium: 9.1 mg/dL (ref 8.9–10.3)
Chloride: 105 mmol/L (ref 98–111)
Creatinine, Ser: 0.95 mg/dL (ref 0.44–1.00)
GFR, Estimated: >60 mL/min (ref >60)
Glucose, Bld: 87 mg/dL (ref 70–99)
Potassium: 4.8 mmol/L (ref 3.5–5.1)
Sodium: 138 mmol/L (ref 135–145)

## 2023-02-12 LAB — URINALYSIS, ROUTINE W REFLEX MICROSCOPIC
Bilirubin Urine: NEGATIVE
Glucose, UA: NEGATIVE mg/dL
Hgb urine dipstick: NEGATIVE
Ketones, ur: NEGATIVE mg/dL
Leukocytes,Ua: NEGATIVE
Nitrite: NEGATIVE
Protein, ur: NEGATIVE mg/dL
Specific Gravity, Urine: 1.028 (ref 1.005–1.030)
pH: 5 (ref 5.0–8.0)

## 2023-02-12 LAB — CBC WITH DIFFERENTIAL/PLATELET
Abs Immature Granulocytes: 0.02 10*3/uL (ref 0.00–0.07)
Basophils Absolute: 0 10*3/uL (ref 0.0–0.1)
Basophils Relative: 0 %
Eosinophils Absolute: 0 10*3/uL (ref 0.0–0.5)
Eosinophils Relative: 1 %
HCT: 40.6 % (ref 36.0–46.0)
Hemoglobin: 12.5 g/dL (ref 12.0–15.0)
Immature Granulocytes: 0 %
Lymphocytes Relative: 33 %
Lymphs Abs: 2.2 10*3/uL (ref 0.7–4.0)
MCH: 28.7 pg (ref 26.0–34.0)
MCHC: 30.8 g/dL (ref 30.0–36.0)
MCV: 93.3 fL (ref 80.0–100.0)
Monocytes Absolute: 0.4 10*3/uL (ref 0.1–1.0)
Monocytes Relative: 6 %
Neutro Abs: 3.9 10*3/uL (ref 1.7–7.7)
Neutrophils Relative %: 60 %
Platelets: 259 10*3/uL (ref 150–400)
RBC: 4.35 MIL/uL (ref 3.87–5.11)
RDW: 15.9 % — ABNORMAL HIGH (ref 11.5–15.5)
WBC: 6.5 10*3/uL (ref 4.0–10.5)
nRBC: 0 % (ref 0.0–0.2)

## 2023-02-12 LAB — C-REACTIVE PROTEIN: CRP: <0.5 mg/dL (ref <1.0)

## 2023-02-12 LAB — SEDIMENTATION RATE: Sed Rate: 36 mm/hr — ABNORMAL HIGH (ref 0–22)

## 2023-02-12 MED ORDER — LORAZEPAM 1 MG PO TABS
0.5000 mg | ORAL_TABLET | ORAL | Status: AC | PRN
  Administered 2023-02-12: 0.5 mg via ORAL
  Filled 2023-02-12: qty 1

## 2023-02-12 MED ORDER — LIDOCAINE 5 % EX PTCH: 1.0000 | MEDICATED_PATCH | CUTANEOUS | 0 refills | Status: DC

## 2023-02-12 MED ORDER — HYDROCODONE-ACETAMINOPHEN 5-325 MG PO TABS: 1.0000 | ORAL_TABLET | Freq: Four times a day (QID) | ORAL | 0 refills | Status: DC | PRN

## 2023-02-12 MED ORDER — LIDOCAINE 5 % EX PTCH
1.0000 | MEDICATED_PATCH | CUTANEOUS | Status: DC
  Administered 2023-02-12: 1 via TRANSDERMAL
  Filled 2023-02-12: qty 1

## 2023-02-12 MED ORDER — GADOBUTROL 1 MMOL/ML IV SOLN
10.0000 mL | Freq: Once | INTRAVENOUS | Status: AC | PRN
  Administered 2023-02-12: 10 mL via INTRAVENOUS

## 2023-02-12 MED ORDER — HYDROCODONE-ACETAMINOPHEN 5-325 MG PO TABS
2.0000 | ORAL_TABLET | Freq: Once | ORAL | Status: AC
  Administered 2023-02-12: 2 via ORAL
  Filled 2023-02-12: qty 2

## 2023-02-12 NOTE — ED Provider Notes (Signed)
[place] EMERGENCY DEPARTMENT AT [facility] Provider Note   CSN: [id number] Arrival date & time: 02/12/23  1101     History {Add pertinent medical, surgical, social history, OB history to HPI:1} Chief Complaint  Patient presents with   Back Pain    Cathy [name] is a 62 y.o. female.   Back Pain      Home Medications Prior to Admission medications   Medication Sig Start Date End Date Taking? Authorizing Provider  acetaminophen (TYLENOL) 500 MG tablet Take 2 tablets (1,000 mg total) by mouth every 6 (six) hours as needed. 04/17/22   Carlisle Beers, FNP  albuterol (VENTOLIN HFA) 108 (90 Base) MCG/ACT inhaler Inhale 1-2 puffs into the lungs every 6 (six) hours as needed for wheezing or shortness of breath. 11/09/22   Hoy Register, MD  amLODipine (NORVASC) 10 MG tablet Take 1 tablet (10 mg total) by mouth daily. 11/06/22   Hoy Register, MD  aspirin EC 81 MG tablet Take 1 tablet (81 mg total) by mouth 2 (two) times daily. Patient taking differently: Take 81 mg by mouth daily. 01/06/20   Anders Simmonds, PA-C  atorvastatin (LIPITOR) 20 MG tablet Take 1 tablet (20 mg total) by mouth daily. 11/08/22   Hoy Register, MD  cetirizine (ZYRTEC) 10 MG tablet Take 1 tablet (10 mg total) by mouth daily. 11/06/22   Hoy Register, MD  fluticasone (FLONASE) 50 MCG/ACT nasal spray Place 2 sprays into both nostrils daily. 05/22/21   Storm Frisk, MD  fluticasone (FLOVENT HFA) 44 MCG/ACT inhaler Inhale 2 puffs into the lungs 2 (two) times daily. 11/06/22   Hoy Register, MD  ibuprofen (ADVIL) 600 MG tablet Take 1 tablet (600 mg total) by mouth every 6 (six) hours as needed. 04/17/22   Carlisle Beers, FNP  Misc. Devices MISC CPAP, AutoPap 5 - 20 cm water.  Diagnosis-obstructive sleep apnea 10/06/21   Hoy Register, MD  nitroGLYCERIN (NITROSTAT) 0.4 MG SL tablet Place 1 tablet (0.4 mg total) under the tongue every 5 (five) minutes as needed for chest pain. 05/20/17  04/17/22  Georgeanna Lea, MD  predniSONE (DELTASONE) 20 MG tablet Take 1 tablet (20 mg total) by mouth daily with breakfast. Patient not taking: Reported on 12/12/2022 11/06/22   Hoy Register, MD  SUMAtriptan (IMITREX) 50 MG tablet Take 1 tablet (50 mg total) by mouth every 2 (two) hours as needed for migraine. May repeat in 2 hours if headache persists or recurs. 05/17/22   Ocie Doyne, MD  tiZANidine (ZANAFLEX) 4 MG tablet Take 1 tablet (4 mg total) by mouth 3 (three) times daily as needed for muscle spasms. 06/21/22   Hoy Register, MD  topiramate (TOPAMAX) 100 MG tablet Take 1 tablet (100 mg total) by mouth 2 (two) times daily. 06/13/22   Hoy Register, MD      Allergies    Isovue [iopamidol]    Review of Systems   Review of Systems  Musculoskeletal:  Positive for back pain.    Physical Exam Updated Vital Signs BP (!) 172/98 (BP Location: Right Arm)   Pulse 67   Temp 98.4 F (36.9 C) (Oral)   Resp 18   Ht 5\' 4"  (1.626 m)   Wt 121.1 kg   SpO2 93%   BMI 45.83 kg/m  Physical Exam  ED Results / Procedures / Treatments   Labs (all labs ordered are listed, but only abnormal results are displayed) Labs Reviewed - No data to display  EKG None  Radiology DG Lumbar Spine Complete  Result Date: 02/12/2023 CLINICAL DATA:  back pain EXAM: LUMBAR SPINE - COMPLETE 4 VIEW COMPARISON:  None Available. FINDINGS: Five lumbar type vertebral bodies. Vertebral body heights are maintained. Mild retrolisthesis of L4 on L5. 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 Pelvic phleboliths. Assessment of the sacrum is slightly limited due to overlying bowel gas. Symmetric SI joints. IMPRESSION: No acute osseous abnormality. Lower lumbar spine predominant degenerative changes. Electronically Signed   By: Lorenza Cambridge M.D.   On: 02/12/2023 12:46    Procedures Procedures  {Document cardiac monitor, telemetry  assessment procedure when appropriate:1}  Medications Ordered in ED Medications - No data to display  ED Course/ Medical Decision Making/ A&P   {   Click here for ABCD2, HEART and other calculatorsREFRESH Note before signing :1}                          Medical Decision Making  ***  {Document critical care time when appropriate:1} {Document review of labs and clinical decision tools ie heart score, Chads2Vasc2 etc:1}  {Document your independent review of radiology images, and any outside records:1} {Document your discussion with family members, caretakers, and with consultants:1} {Document social determinants of health affecting pt's care:1} {Document your decision making why or why not admission, treatments were needed:1} Final Clinical Impression(s) / ED Diagnoses Final diagnoses:  None    Rx / DC Orders ED Discharge Orders     None

## 2023-02-12 NOTE — ED Provider Notes (Signed)
Patient seen after prior EDP.  MRI results discussed in detail.  Patient is comfortable with plan for discharge.  Importance of close follow-up is stressed.  Strict return precautions given and understood.   Wynetta Fines, MD 02/12/23 Paulo Fruit

## 2023-02-12 NOTE — ED Notes (Signed)
Remains in MRI 

## 2023-02-12 NOTE — Telephone Encounter (Signed)
  Chief Complaint: back injury- severe pain Symptoms: sever pain, unable to move normally  Frequency: injury yesterday- lifting Pertinent Negatives: Patient denies numbness in arms/legs Disposition: [x] ED /[] Urgent Care (no appt availability in office) / [] Appointment(In office/virtual)/ []  [place] [facility]/ [] Home Care/ [] Refused Recommended Disposition /[] [facility]/ []  Follow-up with PCP Additional Notes: Due to severe pain- and inability to move normally- ED advised.

## 2023-02-12 NOTE — ED Notes (Signed)
MRI updated via phone, pending MRI transport. Pt comfortable, sitting in chair per preference. Denies sx other than pain. Pain improved 8/10. Denies urinary sx, fall/ injury, saddle paresthesia, numbness, tingling, weakness, nausea, sob, incontinence, or fever.

## 2023-02-12 NOTE — Discharge Instructions (Signed)
Return for any problem.  ?

## 2023-02-12 NOTE — ED Triage Notes (Signed)
Patient arrives ambulatory by POV c/o lower back pain onset of yesterday after lifting some empty crates. Patient states history of two back surgeries and not supposed to lift over 5lbs. Called her doctor for pain medicine and was sent here for x-ray. Has taken tylenol, ibuprofen, gabapentin and muscle relaxers w/o relief.

## 2023-02-12 NOTE — Telephone Encounter (Signed)
Summary: pulled muscle   Pt pulled a muscle in her lower back yesterday and would like something sent to the pharmacy for pain.  CB#  [phone]     Reason for Disposition  [1] SEVERE PAIN in kidney area (flank) AND [2] follows direct blow to that site  Answer Assessment - Initial Assessment Questions 1. MECHANISM: "How did the injury happen?" (Consider the possibility of domestic violence or elder abuse)     Picked up a crate- pulled muscle- went to raise up and unable to starten up 2. ONSET: "When did the injury happen?" (Minutes or hours ago)     Yesterday am- 8:30 3. LOCATION: "What part of the back is injured?"     Lower back - all across 4. SEVERITY: "Can you move the back normally?"     Unable to move normally 5. PAIN: "Is there any pain?" If Yes, ask: "How bad is the pain?"   (Scale 1-10; or mild, moderate, severe)     severe 6. CORD SYMPTOMS: Any weakness or numbness of the arms or legs?"     no 7. SIZE: For cuts, bruises, or swelling, ask: "How large is it?" (e.g., inches or centimeters)     no 8. TETANUS: For any breaks in the skin, ask: "When was the last tetanus booster?"     na 9. OTHER SYMPTOMS: "Do you have any other symptoms?" (e.g., abdomen pain, blood in urine)     Feet swell- but not new symptom  Protocols used: Back Injury-A-AH

## 2023-02-12 NOTE — ED Notes (Signed)
Pt to MRI via w/c.

## 2023-02-12 NOTE — ED Notes (Signed)
Pt alert, NAD, calm, interactive, steady gait from w/r to [facility] chair, sitting on personal cushion with blanket lumbar support.

## 2023-02-13 ENCOUNTER — Ambulatory Visit: Payer: 59 | Attending: Family Medicine

## 2023-02-13 ENCOUNTER — Telehealth: Payer: Self-pay

## 2023-02-13 VITALS — Ht 64.0 in | Wt 267.0 lb

## 2023-02-13 DIAGNOSIS — Z0000 Encounter for general adult medical examination without abnormal findings: Secondary | ICD-10-CM

## 2023-02-13 NOTE — Patient Instructions (Signed)
Cathy [name] , Thank you for taking time to come for your Medicare Wellness Visit. I appreciate your ongoing commitment to your health goals. Please review the following plan we discussed and let me know if I can assist you in the future.   These are the goals we discussed:  Goals      Blood Pressure < 140/90     Increase physical activity        This is a list of the screening recommended for you and due dates:  Health Maintenance  Topic Date Due   COVID-19 Vaccine (3 - 2023-24 season) 04/06/2022   Zoster (Shingles) Vaccine (1 of 2) 05/16/2023*   Flu Shot  03/07/2023   Mammogram  03/26/2023   Medicare Annual Wellness Visit  02/13/2024   Pap Smear  12/11/2025   Colon Cancer Screening  07/26/2027   DTaP/Tdap/Td vaccine (2 - Td or Tdap) 11/13/2027   Hepatitis C Screening  Completed   HIV Screening  Completed   HPV Vaccine  Aged Out  *Topic was postponed. The date shown is not the original due date.    Advanced directives: Information on Advanced Care Planning can be found at [facility] of [facility] Advance Health Care Directives Advance Health Care Directives ([email or web address] Please bring a copy of your health care power of attorney and living will to the office to be added to your chart at your convenience.  Conditions/risks identified: Aim for 30 minutes of exercise or brisk walking, 6-8 glasses of water, and 5 servings of fruits and vegetables each day.  Next appointment: Follow up in one year for your annual wellness visit.   Preventive Care 40-64 Years, Female Preventive care refers to lifestyle choices and visits with your health care provider that can promote health and wellness. What does preventive care include? A yearly physical exam. This is also called an annual well check. Dental exams once or twice a year. Routine eye exams. Ask your health care provider how often you should have your eyes checked. Personal lifestyle choices, including: Daily care of your  teeth and gums. Regular physical activity. Eating a healthy diet. Avoiding tobacco and drug use. Limiting alcohol use. Practicing safe sex. Taking low-dose aspirin daily starting at age 52. Taking vitamin and mineral supplements as recommended by your health care provider. What happens during an annual well check? The services and screenings done by your health care provider during your annual well check will depend on your age, overall health, lifestyle risk factors, and family history of disease. Counseling  Your health care provider may ask you questions about your: Alcohol use. Tobacco use. Drug use. Emotional well-being. Home and relationship well-being. Sexual activity. Eating habits. Work and work Astron[employer]. Method of birth control. Menstrual cycle. Pregnancy history. Screening  You may have the following tests or measurements: Height, weight, and BMI. Blood pressure. Lipid and cholesterol levels. These may be checked every 5 years, or more frequently if you are over 13 years old. Skin check. Lung cancer screening. You may have this screening every year starting at age 101 if you have a 30-pack-year history of smoking and currently smoke or have quit within the past 15 years. Fecal occult blood test (FOBT) of the stool. You may have this test every year starting at age 20. Flexible sigmoidoscopy or colonoscopy. You may have a sigmoidoscopy every 5 years or a colonoscopy every 10 years starting at age 1. Hepatitis C blood test. Hepatitis B blood test. Sexually transmitted disease (STD) testing.  Diabetes screening. This is done by checking your blood sugar (glucose) after you have not eaten for a while (fasting). You may have this done every 1-3 years. Mammogram. This may be done every 1-2 years. Talk to your health care provider about when you should start having regular mammograms. This may depend on whether you have a family history of breast cancer. BRCA-related cancer  screening. This may be done if you have a family history of breast, ovarian, tubal, or peritoneal cancers. Pelvic exam and Pap test. This may be done every 3 years starting at age 31. Starting at age 61, this may be done every 5 years if you have a Pap test in combination with an HPV test. Bone density scan. This is done to screen for osteoporosis. You may have this scan if you are at high risk for osteoporosis. Discuss your test results, treatment options, and if necessary, the need for more tests with your health care provider. Vaccines  Your health care provider may recommend certain vaccines, such as: Influenza vaccine. This is recommended every year. Tetanus, diphtheria, and acellular pertussis (Tdap, Td) vaccine. You may need a Td booster every 10 years. Zoster vaccine. You may need this after age 77. Pneumococcal 13-valent conjugate (PCV13) vaccine. You may need this if you have certain conditions and were not previously vaccinated. Pneumococcal polysaccharide (PPSV23) vaccine. You may need one or two doses if you smoke cigarettes or if you have certain conditions. Talk to your health care provider about which screenings and vaccines you need and how often you need them. This information is not intended to replace advice given to you by your health care provider. Make sure you discuss any questions you have with your health care provider. Document Released: 08/19/2015 Document Revised: 04/11/2016 Document Reviewed: 05/24/2015 Elsevier Interactive Patient Education  2017 [employer].    Fall Prevention in the Home Falls can cause injuries. They can happen to people of all ages. There are many things you can do to make your home safe and to help prevent falls. What can I do on the outside of my home? Regularly fix the edges of walkways and driveways and fix any cracks. Remove anything that might make you trip as you walk through a door, such as a raised step or threshold. Trim any  bushes or trees on the path to your home. Use bright outdoor lighting. Clear any walking paths of anything that might make someone trip, such as rocks or tools. Regularly check to see if handrails are loose or broken. Make sure that both sides of any steps have handrails. Any raised decks and porches should have guardrails on the edges. Have any leaves, snow, or ice cleared regularly. Use sand or salt on walking paths during winter. Clean up any spills in your garage right away. This includes oil or grease spills. What can I do in the bathroom? Use night lights. Install grab bars by the toilet and in the tub and shower. Do not use towel bars as grab bars. Use non-skid mats or decals in the tub or shower. If you need to sit down in the shower, use a plastic, non-slip stool. Keep the floor dry. Clean up any water that spills on the floor as soon as it happens. Remove soap buildup in the tub or shower regularly. Attach bath mats securely with double-sided non-slip rug tape. Do not have throw rugs and other things on the floor that can make you trip. What can I do in  the bedroom? Use night lights. Make sure that you have a light by your bed that is easy to reach. Do not use any sheets or blankets that are too big for your bed. They should not hang down onto the floor. Have a firm chair that has side arms. You can use this for support while you get dressed. Do not have throw rugs and other things on the floor that can make you trip. What can I do in the kitchen? Clean up any spills right away. Avoid walking on wet floors. Keep items that you use a lot in easy-to-reach places. If you need to reach something above you, use a strong step stool that has a grab bar. Keep electrical cords out of the way. Do not use floor polish or wax that makes floors slippery. If you must use wax, use non-skid floor wax. Do not have throw rugs and other things on the floor that can make you trip. What can I do  with my stairs? Do not leave any items on the stairs. Make sure that there are handrails on both sides of the stairs and use them. Fix handrails that are broken or loose. Make sure that handrails are as long as the stairways. Check any carpeting to make sure that it is firmly attached to the stairs. Fix any carpet that is loose or worn. Avoid having throw rugs at the top or bottom of the stairs. If you do have throw rugs, attach them to the floor with carpet tape. Make sure that you have a light switch at the top of the stairs and the bottom of the stairs. If you do not have them, ask someone to add them for you. What else can I do to help prevent falls? Wear shoes that: Do not have high heels. Have rubber bottoms. Are comfortable and fit you well. Are closed at the toe. Do not wear sandals. If you use a stepladder: Make sure that it is fully opened. Do not climb a closed stepladder. Make sure that both sides of the stepladder are locked into place. Ask someone to hold it for you, if possible. Clearly mark and make sure that you can see: Any grab bars or handrails. First and last steps. Where the edge of each step is. Use tools that help you move around (mobility aids) if they are needed. These include: Canes. Walkers. Scooters. Crutches. Turn on the lights when you go into a dark area. Replace any light bulbs as soon as they burn out. Set up your furniture so you have a clear path. Avoid moving your furniture around. If any of your floors are uneven, fix them. If there are any pets around you, be aware of where they are. Review your medicines with your doctor. Some medicines can make you feel dizzy. This can increase your chance of falling. Ask your doctor what other things that you can do to help prevent falls. This information is not intended to replace advice given to you by your health care provider. Make sure you discuss any questions you have with your health care  provider. Document Released: 05/19/2009 Document Revised: 12/29/2015 Document Reviewed: 08/27/2014 Elsevier Interactive Patient Education  2017 [employer].
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
## 2023-02-13 NOTE — Transitions of Care (Post Inpatient/ED Visit) (Signed)
02/13/2023  Name: Cathy [name] MRN: [id number] DOB: 1960-11-18  Today's TOC FU Call Status: Today's TOC FU Call Status:: Successful TOC FU Call Competed  Transition Care Management Follow-up Telephone Call Date of Discharge: 02/12/23 Discharge Facility: [facility]) Type of Discharge: Emergency Department Reason for ED Visit: Orthopedic Conditions Orthopedic/Injury Diagnosis: Sprain or Strain How have you been since you were released from the hospital?: Same Any questions or concerns?: No  Items Reviewed: Did you receive and understand the discharge instructions provided?: Yes Medications obtained,verified, and reconciled?: Yes (Medications Reviewed) Any new allergies since your discharge?: No Dietary orders reviewed?: NA Do you have support at home?: Yes People in Home: grandchild(ren), child(ren), adult  Medications Reviewed Today: Medications Reviewed Today     Reviewed by Hoy Register, MD (Physician) on 12/12/22 at 1121  Med List Status: <None>   Medication Order Taking? Sig Documenting Provider Last Dose Status Informant  acetaminophen (TYLENOL) 500 MG tablet [id number] Yes Take 2 tablets (1,000 mg total) by mouth every 6 (six) hours as needed. Carlisle Beers, FNP Taking Active   albuterol (VENTOLIN HFA) 108 (90 Base) MCG/ACT inhaler [id number] Yes Inhale 1-2 puffs into the lungs every 6 (six) hours as needed for wheezing or shortness of breath. Hoy Register, MD Taking Active   amLODipine (NORVASC) 10 MG tablet [id number] Yes Take 1 tablet (10 mg total) by mouth daily. Hoy Register, MD Taking Active   aspirin EC 81 MG tablet [id number] Yes Take 1 tablet (81 mg total) by mouth 2 (two) times daily.  Patient taking differently: Take 81 mg by mouth daily.   Anders Simmonds, PA-C Taking Active   atorvastatin (LIPITOR) 20 MG tablet [id number] Yes Take 1 tablet (20 mg total) by mouth daily. Hoy Register, MD Taking Active   cetirizine (ZYRTEC) 10 MG tablet  [id number] Yes Take 1 tablet (10 mg total) by mouth daily. Hoy Register, MD Taking Active   fluticasone (FLONASE) 50 MCG/ACT nasal spray [id number] Yes Place 2 sprays into both nostrils daily. Storm Frisk, MD Taking Active   fluticasone [facility] HFA) 44 MCG/ACT inhaler [id number] Yes Inhale 2 puffs into the lungs 2 (two) times daily. Hoy Register, MD Taking Active   ibuprofen (ADVIL) 600 MG tablet [id number] Yes Take 1 tablet (600 mg total) by mouth every 6 (six) hours as needed. Carlisle Beers, FNP Taking Active   Misc. Devices MISC [id number] Yes CPAP, AutoPap 5 - 20 cm water.  Diagnosis-obstructive sleep apnea Hoy Register, MD Taking Active   nitroGLYCERIN (NITROSTAT) 0.4 MG SL tablet [id number]  Place 1 tablet (0.4 mg total) under the tongue every 5 (five) minutes as needed for chest pain. Georgeanna Lea, MD  Expired 04/17/22 2359   predniSONE (DELTASONE) 20 MG tablet [id number] No Take 1 tablet (20 mg total) by mouth daily with breakfast.  Patient not taking: Reported on 12/12/2022   Hoy Register, MD Not Taking Active   SUMAtriptan (IMITREX) 50 MG tablet [id number] Yes Take 1 tablet (50 mg total) by mouth every 2 (two) hours as needed for migraine. May repeat in 2 hours if headache persists or recurs. Ocie Doyne, MD Taking Active   tiZANidine (ZANAFLEX) 4 MG tablet [id number] Yes Take 1 tablet (4 mg total) by mouth 3 (three) times daily as needed for muscle spasms. Hoy Register, MD Taking Active   topiramate (TOPAMAX) 100 MG tablet [id number] Yes Take 1 tablet (100 mg total) by mouth 2 (two) times daily. Hoy Register, MD Taking Active  Home Care and Equipment/Supplies: Were Home Health Services Ordered?: No Any new equipment or medical supplies ordered?: No  Functional Questionnaire: Do you need assistance with bathing/showering or dressing?: No Do you need assistance with meal preparation?: Yes Do you need assistance with eating?: No Do you  have difficulty maintaining continence: No Do you need assistance with getting out of bed/getting out of a chair/moving?: Yes Do you have difficulty managing or taking your medications?: No  Follow up appointments reviewed: PCP Follow-up appointment confirmed?: NA Specialist Hospital Follow-up appointment confirmed?: No Reason Specialist Follow-Up Not Confirmed: TOC Calling Clinician Notified Provider Practice of Needed Appointment Do you need transportation to your follow-up appointment?: No Do you understand care options if your condition(s) worsen?: Yes-patient verbalized understanding  Patient mentioned that she may need a referral to a neurosurgeon due to history of back surgeries.   SIGNATURE Kandis Fantasia, LPN [facility] Advisor [place] [facility] You Are. We Are. One [employer] # [phone]

## 2023-02-28 ENCOUNTER — Ambulatory Visit: Payer: Self-pay | Admitting: *Deleted

## 2023-02-28 NOTE — Telephone Encounter (Signed)
Patient called, unable to leave voicemail. 2nd attempt  Summary: breast leakage   Lurena Joiner from [facility] called stated she called patient to do a reminder for her mammogram and patient advised her she was having leakage from her breast when she take her bra off at night. Patient just had surgery about 2 weeks ago on her back. Please f/u with patient at [phone]

## 2023-02-28 NOTE — Telephone Encounter (Signed)
Summary: breast leakage   Lurena Joiner from [facility] called stated she called patient to do a reminder for her mammogram and patient advised her she was having leakage from her breast when she take her bra off at night. Patient just had surgery about 2 weeks ago on her back. Please f/u with patient at [phone]      Called pt - left message on machine to call back.  Unable to reach pt after 3 tries. I will forward chart to office for follow up.

## 2023-02-28 NOTE — Telephone Encounter (Signed)
Summary: breast leakage   Cathy [name] from [facility] called stated she called patient to do a reminder for her mammogram and patient advised her she was having leakage from her breast when she take her bra off at night. Patient just had surgery about 2 weeks ago on her back. Please f/u with patient at [phone]     Attempted to call patient- no answer- unable to leave call back message

## 2023-03-01 NOTE — Telephone Encounter (Addendum)
Call placed to patient unable to reach unable to leave a message.  Letter mailed.

## 2023-03-12 ENCOUNTER — Telehealth: Payer: Self-pay | Admitting: Family Medicine

## 2023-03-12 NOTE — Telephone Encounter (Signed)
Pt called in about letter she received about if she still had breast leakage problem. I tried to assist but she hung up. I dont see a referral in the system. She says she was told to reschedule by the referral. I tried to call back, no answer. I also tried to reach Elsie Lincoln to see if a referral should be put in. She has already spoke with NT previously

## 2023-03-12 NOTE — Telephone Encounter (Signed)
Call placed to patient unable to reach message left on VM.  Call to advise patient a 3D SCREENING MAMMOGRAM BILATERAL BREAST (Order [id number])  was ordered May and the order is still valid.

## 2023-03-12 NOTE — Telephone Encounter (Signed)
Referral Request - Has patient seen PCP for this complaint? yes *If NO, is insurance requiring patient see PCP for this issue before PCP can refer them? Referral for which specialty: mammogram Preferred provider/office: ? Reason for referral: has breast leakage

## 2023-03-15 ENCOUNTER — Other Ambulatory Visit: Payer: Self-pay | Admitting: Family Medicine

## 2023-03-15 ENCOUNTER — Telehealth: Payer: Self-pay

## 2023-03-15 DIAGNOSIS — G8929 Other chronic pain: Secondary | ICD-10-CM

## 2023-03-15 NOTE — Telephone Encounter (Signed)
Medication Refill - Medication: HYDROcodone-acetaminophen (NORCO/VICODIN) 5-325 MG tablet   Has the patient contacted their pharmacy? No.   Preferred Pharmacy (with phone number or street name):  [id number] - [place] ([place]), [place] - [address] Phone: [phone]  Fax: [phone]      Has the patient been seen for an appointment in the last year OR does the patient have an upcoming appointment? Yes.    Patient is going out of town for a funeral and needs at least enough to get her to her next appt if possible. She had ana ppt scheduled for next week but she has to go out of town for the funeral and rescheduled to the next available time and is on the wait list. She has also requested a referral to a spine specialist. Please assist patient further

## 2023-03-15 NOTE — Telephone Encounter (Signed)
Routing to PCP for review.

## 2023-03-15 NOTE — Telephone Encounter (Signed)
Requested medication (s) are due for refill today: yes  Requested medication (s) are on the active medication list: yes    Last refill: 02/12/23  #10  0 refills  Future visit scheduled yes 05/28/23  Notes to clinic: Not delegated and historical provider. Please review. Thank you.  Requested Prescriptions  Pending Prescriptions Disp Refills   HYDROcodone-acetaminophen (NORCO/VICODIN) 5-325 MG tablet 10 tablet 0    Sig: Take 1 tablet by mouth every 6 (six) hours as needed.     Not Delegated - Analgesics:  Opioid Agonist Combinations Failed - 03/15/2023 10:06 AM      Failed - This refill cannot be delegated      Failed - Urine Drug Screen completed in last 360 days      Passed - Valid encounter within last 3 months    Recent Outpatient Visits           3 months ago Essential hypertension   [place] [facility] [place], Odette Horns, MD   4 months ago Acute cough   [place] [facility] Hoy Register, MD   9 months ago Muscle spasm   [place] [facility] Hoy Register, MD   10 months ago Prediabetes    [facility] Hoy Register, MD   1 year ago Injury of finger of right hand, subsequent encounter   [facility] [place], Shea Stakes, NP       Future Appointments             In 2 months Hoy Register, MD [facility]

## 2023-03-15 NOTE — Telephone Encounter (Signed)
Pt is calling back to check on the status of the medication refill. Advised medications can take up to 3 business days

## 2023-03-15 NOTE — Telephone Encounter (Signed)
Copied from CRM [id number]. Topic: Referral - Request for Referral >> Mar 15, 2023  9:44 AM Marlow Baars wrote: Has patient seen PCP for this complaint? Yes.   Referral for which specialty: Neurosurgeon Preferred provider/office: Spine specialist/Neurosurgery Reason for referral: Back pain  Please assist patient further

## 2023-03-18 ENCOUNTER — Telehealth: Payer: Self-pay

## 2023-03-18 DIAGNOSIS — M545 Low back pain, unspecified: Secondary | ICD-10-CM

## 2023-03-18 MED ORDER — LIDOCAINE 5 % EX PTCH: 1.0000 | MEDICATED_PATCH | CUTANEOUS | 0 refills | Status: DC

## 2023-03-18 NOTE — Telephone Encounter (Signed)
Pt has been informed of referral being placed. 

## 2023-03-18 NOTE — Telephone Encounter (Signed)
Pt is wanting to be referred to pain management for refill for Hydrocodone

## 2023-03-18 NOTE — Addendum Note (Signed)
Addended byHoy Register on: 03/18/2023 01:28 PM   Modules accepted: Orders

## 2023-03-18 NOTE — Telephone Encounter (Signed)
Referral has been placed. 

## 2023-03-19 ENCOUNTER — Ambulatory Visit: Payer: 59 | Admitting: Family Medicine

## 2023-03-20 NOTE — Addendum Note (Signed)
Addended by: Hoy Register on: 03/20/2023 12:49 PM   Modules accepted: Orders

## 2023-03-20 NOTE — Telephone Encounter (Signed)
Referral has been placed. 

## 2023-03-21 DIAGNOSIS — M461 Sacroiliitis, not elsewhere classified: Secondary | ICD-10-CM | POA: Diagnosis not present

## 2023-03-21 NOTE — Telephone Encounter (Signed)
Pt was called and informed of referral being placed. ?

## 2023-03-30 DIAGNOSIS — G4733 Obstructive sleep apnea (adult) (pediatric): Secondary | ICD-10-CM | POA: Diagnosis not present

## 2023-04-01 ENCOUNTER — Telehealth: Payer: Self-pay

## 2023-04-01 DIAGNOSIS — N644 Mastodynia: Secondary | ICD-10-CM

## 2023-04-01 NOTE — Telephone Encounter (Signed)
Pt informed the intake rep at the breast center that she is having some pain in right breast, diagnostic MM is needed for patient.

## 2023-04-01 NOTE — Addendum Note (Signed)
Addended by: Hoy Register on: 04/01/2023 05:44 PM   Modules accepted: Orders

## 2023-04-01 NOTE — Telephone Encounter (Signed)
Copied from CRM [id number]. Topic: General - Other >> Apr 01, 2023  1:55 PM Franchot Heidelberg wrote: Reason for CRM: Pt called requesting a mammogram screening. Festus Barren from [facility] called to speak to the clinic. Please advise  Best contact: [phone]

## 2023-04-01 NOTE — Telephone Encounter (Signed)
Done

## 2023-04-02 NOTE — Telephone Encounter (Signed)
Pt has been called and informed of order being changed.

## 2023-05-01 ENCOUNTER — Ambulatory Visit: Admission: RE | Admit: 2023-05-01 | Payer: 59 | Source: Ambulatory Visit

## 2023-05-01 ENCOUNTER — Ambulatory Visit
Admission: RE | Admit: 2023-05-01 | Discharge: 2023-05-01 | Disposition: A | Discharge status: Home / Self Care | Payer: 59 | Source: Ambulatory Visit | Attending: Family Medicine | Admitting: Family Medicine

## 2023-05-01 ENCOUNTER — Other Ambulatory Visit: Payer: Self-pay | Admitting: Family Medicine

## 2023-05-01 ENCOUNTER — Ambulatory Visit: Payer: 59

## 2023-05-01 DIAGNOSIS — Z1231 Encounter for screening mammogram for malignant neoplasm of breast: Secondary | ICD-10-CM

## 2023-05-01 DIAGNOSIS — N644 Mastodynia: Secondary | ICD-10-CM

## 2023-05-28 ENCOUNTER — Ambulatory Visit: Payer: 59 | Attending: Family Medicine | Admitting: Family Medicine

## 2023-05-28 ENCOUNTER — Encounter: Payer: Self-pay | Admitting: Family Medicine

## 2023-05-28 VITALS — BP 156/89 | HR 81 | Ht 64.0 in | Wt 265.0 lb

## 2023-05-28 DIAGNOSIS — E785 Hyperlipidemia, unspecified: Secondary | ICD-10-CM | POA: Diagnosis not present

## 2023-05-28 DIAGNOSIS — M461 Sacroiliitis, not elsewhere classified: Secondary | ICD-10-CM | POA: Diagnosis not present

## 2023-05-28 DIAGNOSIS — I10 Essential (primary) hypertension: Secondary | ICD-10-CM

## 2023-05-28 MED ORDER — TIZANIDINE HCL 4 MG PO TABS: ORAL_TABLET | ORAL | 3 refills | Status: DC

## 2023-05-28 MED ORDER — LIDOCAINE 5 % EX PTCH: 1.0000 | MEDICATED_PATCH | CUTANEOUS | 5 refills | Status: DC

## 2023-05-28 MED ORDER — DULOXETINE HCL 60 MG PO CPEP: 60.0000 mg | ORAL_CAPSULE | Freq: Every day | ORAL | 1 refills | Status: DC

## 2023-05-28 MED ORDER — AMLODIPINE BESYLATE 10 MG PO TABS
10.0000 mg | ORAL_TABLET | Freq: Every day | ORAL | 1 refills | Status: DC
Start: 2023-05-28 — End: 2024-04-13

## 2023-05-28 MED ORDER — ATORVASTATIN CALCIUM 20 MG PO TABS
20.0000 mg | ORAL_TABLET | Freq: Every day | ORAL | 1 refills | Status: DC
Start: 2023-05-28 — End: 2023-12-05
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
## 2023-06-18 ENCOUNTER — Encounter: Payer: Self-pay | Admitting: Physical Therapy

## 2023-06-18 ENCOUNTER — Other Ambulatory Visit: Payer: Self-pay

## 2023-06-18 ENCOUNTER — Ambulatory Visit: Payer: 59 | Attending: Family Medicine | Admitting: Physical Therapy

## 2023-06-18 DIAGNOSIS — M5459 Other low back pain: Secondary | ICD-10-CM | POA: Diagnosis not present

## 2023-06-18 DIAGNOSIS — M6281 Muscle weakness (generalized): Secondary | ICD-10-CM | POA: Insufficient documentation

## 2023-06-18 DIAGNOSIS — M461 Sacroiliitis, not elsewhere classified: Secondary | ICD-10-CM | POA: Insufficient documentation

## 2023-06-18 DIAGNOSIS — R2689 Other abnormalities of gait and mobility: Secondary | ICD-10-CM | POA: Insufficient documentation
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
## 2023-06-28 ENCOUNTER — Ambulatory Visit: Payer: 59

## 2023-06-28 DIAGNOSIS — G4733 Obstructive sleep apnea (adult) (pediatric): Secondary | ICD-10-CM | POA: Diagnosis not present

## 2023-07-11 ENCOUNTER — Ambulatory Visit: Payer: 59 | Attending: Family Medicine | Admitting: Physical Therapy
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
## 2023-07-18 ENCOUNTER — Ambulatory Visit: Payer: 59 | Admitting: Physical Therapy

## 2023-07-25 ENCOUNTER — Ambulatory Visit: Payer: 59 | Admitting: Physical Therapy

## 2023-08-06 ENCOUNTER — Encounter: Payer: 59 | Admitting: Physical Therapy

## 2023-09-03 ENCOUNTER — Encounter: Payer: Self-pay | Admitting: Family Medicine

## 2023-09-03 ENCOUNTER — Ambulatory Visit: Payer: 59 | Attending: Family Medicine | Admitting: Family Medicine

## 2023-09-03 VITALS — BP 169/88 | HR 76 | Ht 64.0 in | Wt 270.2 lb

## 2023-09-03 DIAGNOSIS — R7303 Prediabetes: Secondary | ICD-10-CM | POA: Diagnosis not present

## 2023-09-03 DIAGNOSIS — I10 Essential (primary) hypertension: Secondary | ICD-10-CM | POA: Diagnosis not present

## 2023-09-03 DIAGNOSIS — R531 Weakness: Secondary | ICD-10-CM | POA: Diagnosis not present

## 2023-09-03 DIAGNOSIS — M461 Sacroiliitis, not elsewhere classified: Secondary | ICD-10-CM | POA: Diagnosis not present

## 2023-09-03 DIAGNOSIS — G4733 Obstructive sleep apnea (adult) (pediatric): Secondary | ICD-10-CM | POA: Diagnosis not present

## 2023-09-03 MED ORDER — CHLORTHALIDONE 25 MG PO TABS
25.0000 mg | ORAL_TABLET | Freq: Every day | ORAL | 1 refills | Status: DC
Start: 2023-09-03 — End: 2024-04-13

## 2023-09-03 NOTE — Patient Instructions (Signed)
VISIT SUMMARY:  During today's visit, we discussed your recent health concerns, including left-sided weakness, back pain, and high blood pressure. We also reviewed your management plan for prediabetes and general health maintenance.  YOUR PLAN:  -POSSIBLE STROKE: A stroke occurs when blood flow to a part of your brain is interrupted or reduced, preventing brain tissue from getting oxygen and nutrients. You reported an episode of left-sided weakness and dizziness two months ago, which could indicate a stroke. We will order a CT scan to evaluate this further and recommend resuming daily baby aspirin to help prevent future strokes.  -HYPERTENSION: Hypertension, or high blood pressure, is when the force of the blood against your artery walls is too high. Despite your current treatment with Amlodipine, your blood pressure remains elevated. We will add Chlorthalidone to your treatment plan to help control your blood pressure better. Please recheck your blood pressure in one month.  -PREDIABETES: Prediabetes means your blood sugar levels are higher than normal but not yet high enough to be diagnosed as diabetes. We will check your A1C, kidney function, and liver function today to monitor your condition.  -CHRONIC BACK PAIN: Chronic back pain is long-lasting pain in your back. There have been no recent changes in your symptoms, so we will continue with your current management plan.  -GENERAL HEALTH MAINTENANCE: For your overall health, continue using your CPAP machine for sleep apnea and ensure you are up to date with all necessary vaccinations.  INSTRUCTIONS:  Please follow up for a blood pressure check in one month. We will also contact you with the results of your CT scan and blood tests.
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
## 2023-09-04 ENCOUNTER — Encounter: Payer: Self-pay | Admitting: Family Medicine

## 2023-09-04 LAB — CMP14+EGFR
ALT: 12 [IU]/L (ref 0–32)
AST: 15 [IU]/L (ref 0–40)
Albumin: 4.3 g/dL (ref 3.8–4.9)
Alkaline Phosphatase: 101 [IU]/L (ref 44–121)
BUN/Creatinine Ratio: 11 — ABNORMAL LOW (ref 12–28)
BUN: 11 mg/dL (ref 8–27)
Bilirubin Total: 0.2 mg/dL (ref 0.0–1.2)
CO2: 25 mmol/L (ref 20–29)
Calcium: 9.6 mg/dL (ref 8.7–10.3)
Chloride: 107 mmol/L — ABNORMAL HIGH (ref 96–106)
Creatinine, Ser: 0.99 mg/dL (ref 0.57–1.00)
Globulin, Total: 2.9 g/dL (ref 1.5–4.5)
Glucose: 92 mg/dL (ref 70–99)
Potassium: 4.8 mmol/L (ref 3.5–5.2)
Sodium: 144 mmol/L (ref 134–144)
Total Protein: 7.2 g/dL (ref 6.0–8.5)
eGFR: 65 mL/min/{1.73_m2} (ref >59)

## 2023-09-04 LAB — HEMOGLOBIN A1C
Est. average glucose Bld gHb Est-mCnc: 120 mg/dL
Hgb A1c MFr Bld: 5.8 % — ABNORMAL HIGH (ref 4.8–5.6)

## 2023-09-26 ENCOUNTER — Ambulatory Visit
Admission: RE | Admit: 2023-09-26 | Discharge: 2023-09-26 | Disposition: A | Discharge status: Home / Self Care | Payer: 59 | Source: Ambulatory Visit | Attending: Family Medicine | Admitting: Family Medicine

## 2023-09-26 DIAGNOSIS — R531 Weakness: Secondary | ICD-10-CM

## 2023-09-28 DIAGNOSIS — G4733 Obstructive sleep apnea (adult) (pediatric): Secondary | ICD-10-CM | POA: Diagnosis not present

## 2023-10-09 ENCOUNTER — Telehealth: Payer: Self-pay | Admitting: Licensed Clinical Social Worker

## 2023-10-09 ENCOUNTER — Ambulatory Visit: Payer: 59 | Attending: Family Medicine | Admitting: Family Medicine

## 2023-10-09 ENCOUNTER — Encounter: Payer: Self-pay | Admitting: Family Medicine

## 2023-10-09 VITALS — BP 139/89 | HR 88 | Ht 64.0 in | Wt 270.0 lb

## 2023-10-09 DIAGNOSIS — R0609 Other forms of dyspnea: Secondary | ICD-10-CM | POA: Diagnosis not present

## 2023-10-09 DIAGNOSIS — I10 Essential (primary) hypertension: Secondary | ICD-10-CM

## 2023-10-09 DIAGNOSIS — F40242 Fear of bridges: Secondary | ICD-10-CM

## 2023-10-09 MED ORDER — ALBUTEROL SULFATE HFA 108 (90 BASE) MCG/ACT IN AERS: 1.0000 | INHALATION_SPRAY | Freq: Four times a day (QID) | RESPIRATORY_TRACT | 1 refills | Status: AC | PRN

## 2023-10-09 MED ORDER — NITROGLYCERIN 0.4 MG SL SUBL: 0.4000 mg | SUBLINGUAL_TABLET | SUBLINGUAL | 6 refills | Status: AC | PRN

## 2023-10-09 NOTE — Patient Instructions (Signed)
 VISIT SUMMARY:  During today's visit, we discussed your hypertension, panic attacks, and chest pain. Your blood pressure is well controlled with your current medication, although you are experiencing frequent urination as a side effect. We also talked about your long-standing fear of bridges and overpasses, which is causing panic attacks and affecting your daily life. Additionally, you mentioned experiencing chest pain and requested nitroglycerin. We also reviewed your general health maintenance, including your exercise routine.  YOUR PLAN:  -HYPERTENSION: Hypertension, or high blood pressure, is being well managed with your current medication. Although you are experiencing frequent urination as a side effect, you are comfortable with it. Please continue with your current medication regimen.  -PANIC ATTACKS: Panic attacks are sudden episodes of intense fear that can be debilitating. Your fear of bridges and overpasses is causing these attacks and affecting your quality of life. We will refer you to a social worker named Toni Amend to explore therapy options, including cognitive behavioral therapy, which can help you gradually overcome this fear.  -CHEST PAIN: Chest pain can be a sign of heart problems. You requested nitroglycerin, which is a medication that helps relieve chest pain. We will prescribe nitroglycerin for you to use as needed.  -GENERAL HEALTH MAINTENANCE: You are doing well with your exercise routine by using a treadmill at home. Regular exercise is important for maintaining good health, so please continue with your current exercise habits.  INSTRUCTIONS:  Please follow up with the social worker, Toni Amend, for therapy options regarding your panic attacks. Continue taking your hypertension medication as prescribed and use nitroglycerin as needed for chest pain. Keep up with your regular exercise routine.  For more information, you can read your full clinical note, available in your  patient portal.

## 2023-10-09 NOTE — Telephone Encounter (Signed)
 Cathy [name], this is a [facility] patient, I met today during a warm handoff for Dr. Alvis Lemmings patient has extreme fears of going over bridges and other driving fears. Please contact her to possibly have some virtual visit to develop coping skills, please.
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
## 2023-11-05 DIAGNOSIS — G4733 Obstructive sleep apnea (adult) (pediatric): Secondary | ICD-10-CM | POA: Diagnosis not present

## 2023-12-05 ENCOUNTER — Other Ambulatory Visit: Payer: Self-pay | Admitting: Family Medicine

## 2023-12-05 DIAGNOSIS — E785 Hyperlipidemia, unspecified: Secondary | ICD-10-CM

## 2023-12-26 DIAGNOSIS — G4733 Obstructive sleep apnea (adult) (pediatric): Secondary | ICD-10-CM | POA: Diagnosis not present

## 2023-12-27 DIAGNOSIS — G4733 Obstructive sleep apnea (adult) (pediatric): Secondary | ICD-10-CM | POA: Diagnosis not present

## 2024-02-04 DIAGNOSIS — G4733 Obstructive sleep apnea (adult) (pediatric): Secondary | ICD-10-CM | POA: Diagnosis not present

## 2024-02-18 ENCOUNTER — Ambulatory Visit: Payer: 59 | Attending: Family Medicine

## 2024-02-18 VITALS — BP 182/105 | Ht 64.0 in | Wt 265.0 lb

## 2024-02-18 DIAGNOSIS — Z0000 Encounter for general adult medical examination without abnormal findings: Secondary | ICD-10-CM

## 2024-02-18 NOTE — Patient Instructions (Signed)
 Ms. Cathy [name] , Thank you for taking time out of your busy schedule to complete your Annual Wellness Visit with me. I enjoyed our conversation and look forward to speaking with you again next year. I, as well as your care team,  appreciate your ongoing commitment to your health goals. Please review the following plan we discussed and let me know if I can assist you in the future. Your Game plan/ To Do List    Referrals: If you haven't heard from the office you've been referred to, please reach out to them at the phone provided.   Follow up Visits: Next Medicare AWV with our clinical staff: 02/23/2025 at 9:10 a.m. phone visit with Nurse Roz   Have you seen your provider in the last 6 months (3 months if uncontrolled diabetes)? Yes Next Office Visit with your provider: 04/13/2024 at 10:30 a.m. office visit with Dr. Delbert  Clinician Recommendations:  Aim for 30 minutes of exercise or brisk walking, 6-8 glasses of water, and 5 servings of fruits and vegetables each day.       This is a list of the screening recommended for you and due dates:  Health Maintenance  Topic Date Due   Pneumococcal Vaccination (1 of 2 - PCV) Never done   Zoster (Shingles) Vaccine (1 of 2) Never done   Flu Shot  03/06/2024   Medicare Annual Wellness Visit  02/17/2025   Mammogram  04/30/2025   Colon Cancer Screening  07/26/2027   DTaP/Tdap/Td vaccine (2 - Td or Tdap) 11/13/2027   Pap with HPV screening  12/12/2027   COVID-19 Vaccine  Completed   Hepatitis C Screening  Completed   HIV Screening  Completed   Hepatitis B Vaccine  Aged Out   HPV Vaccine  Aged Out   Meningitis B Vaccine  Aged Out    Advanced directives: (Declined) Advance directive discussed with you today. Even though you declined this today, please call our office should you change your mind, and we can give you the proper paperwork for you to fill out. Advance Care Planning is important because it:  [x]  Makes sure you receive the medical care  that is consistent with your values, goals, and preferences  [x]  It provides guidance to your family and loved ones and reduces their decisional burden about whether or not they are making the right decisions based on your wishes.  Follow the link provided in your after visit summary or read over the paperwork we have mailed to you to help you started getting your Advance Directives in place. If you need assistance in completing these, please reach out to us  so that we can help you!  See attachments for Preventive Care and Fall Prevention Tips.
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
## 2024-03-30 DIAGNOSIS — G4733 Obstructive sleep apnea (adult) (pediatric): Secondary | ICD-10-CM | POA: Diagnosis not present

## 2024-04-08 ENCOUNTER — Other Ambulatory Visit: Payer: Self-pay | Admitting: Family Medicine

## 2024-04-08 DIAGNOSIS — E785 Hyperlipidemia, unspecified: Secondary | ICD-10-CM

## 2024-04-13 ENCOUNTER — Encounter: Payer: Self-pay | Admitting: Family Medicine

## 2024-04-13 ENCOUNTER — Ambulatory Visit: Attending: Family Medicine | Admitting: Family Medicine

## 2024-04-13 VITALS — BP 159/98 | HR 72 | Ht 64.0 in | Wt 267.0 lb

## 2024-04-13 DIAGNOSIS — I10 Essential (primary) hypertension: Secondary | ICD-10-CM

## 2024-04-13 DIAGNOSIS — M461 Sacroiliitis, not elsewhere classified: Secondary | ICD-10-CM | POA: Diagnosis not present

## 2024-04-13 DIAGNOSIS — E785 Hyperlipidemia, unspecified: Secondary | ICD-10-CM | POA: Diagnosis not present

## 2024-04-13 DIAGNOSIS — M62838 Other muscle spasm: Secondary | ICD-10-CM | POA: Diagnosis not present

## 2024-04-13 MED ORDER — DULOXETINE HCL 60 MG PO CPEP: 60.0000 mg | ORAL_CAPSULE | Freq: Every day | ORAL | 1 refills | Status: DC

## 2024-04-13 MED ORDER — AMLODIPINE BESYLATE 10 MG PO TABS: 10.0000 mg | ORAL_TABLET | Freq: Every day | ORAL | 1 refills | Status: DC

## 2024-04-13 MED ORDER — ATORVASTATIN CALCIUM 20 MG PO TABS: 20.0000 mg | ORAL_TABLET | Freq: Every day | ORAL | 1 refills | Status: DC

## 2024-04-13 MED ORDER — CHLORTHALIDONE 25 MG PO TABS: 25.0000 mg | ORAL_TABLET | Freq: Every day | ORAL | 1 refills | Status: DC

## 2024-04-13 MED ORDER — TIZANIDINE HCL 4 MG PO TABS: ORAL_TABLET | ORAL | 3 refills | Status: DC
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
## 2024-04-14 ENCOUNTER — Ambulatory Visit: Attending: Family Medicine

## 2024-04-15 ENCOUNTER — Ambulatory Visit: Payer: Self-pay | Admitting: Family Medicine

## 2024-04-15 LAB — LP+NON-HDL CHOLESTEROL
Cholesterol, Total: 151 mg/dL (ref 100–199)
HDL: 68 mg/dL (ref >39)
LDL Chol Calc (NIH): 71 mg/dL (ref 0–99)
Total Non-HDL-Chol (LDL+VLDL): 83 mg/dL (ref 0–129)
Triglycerides: 58 mg/dL (ref 0–149)
VLDL Cholesterol Cal: 12 mg/dL (ref 5–40)

## 2024-04-15 LAB — CMP14+EGFR
ALT: 20 IU/L (ref 0–32)
AST: 23 IU/L (ref 0–40)
Albumin: 4.4 g/dL (ref 3.9–4.9)
Alkaline Phosphatase: 89 IU/L (ref 44–121)
BUN/Creatinine Ratio: 15 (ref 12–28)
BUN: 13 mg/dL (ref 8–27)
Bilirubin Total: 0.3 mg/dL (ref 0.0–1.2)
CO2: 24 mmol/L (ref 20–29)
Calcium: 9.9 mg/dL (ref 8.7–10.3)
Chloride: 107 mmol/L — ABNORMAL HIGH (ref 96–106)
Creatinine, Ser: 0.88 mg/dL (ref 0.57–1.00)
Globulin, Total: 2.7 g/dL (ref 1.5–4.5)
Glucose: 89 mg/dL (ref 70–99)
Potassium: 4.5 mmol/L (ref 3.5–5.2)
Sodium: 144 mmol/L (ref 134–144)
Total Protein: 7.1 g/dL (ref 6.0–8.5)
eGFR: 75 mL/min/1.73 (ref >59)

## 2024-05-06 DIAGNOSIS — G4733 Obstructive sleep apnea (adult) (pediatric): Secondary | ICD-10-CM | POA: Diagnosis not present

## 2024-07-13 ENCOUNTER — Ambulatory Visit: Admitting: Family Medicine

## 2024-07-13 ENCOUNTER — Telehealth: Payer: Self-pay

## 2024-07-13 ENCOUNTER — Ambulatory Visit: Attending: Family Medicine | Admitting: Family Medicine

## 2024-07-13 ENCOUNTER — Encounter: Payer: Self-pay | Admitting: Family Medicine

## 2024-07-13 DIAGNOSIS — M62838 Other muscle spasm: Secondary | ICD-10-CM | POA: Diagnosis not present

## 2024-07-13 DIAGNOSIS — I10 Essential (primary) hypertension: Secondary | ICD-10-CM | POA: Diagnosis not present

## 2024-07-13 DIAGNOSIS — M461 Sacroiliitis, not elsewhere classified: Secondary | ICD-10-CM | POA: Diagnosis not present

## 2024-07-13 DIAGNOSIS — E785 Hyperlipidemia, unspecified: Secondary | ICD-10-CM

## 2024-07-13 MED ORDER — TIZANIDINE HCL 4 MG PO TABS: ORAL_TABLET | ORAL | 3 refills | Status: AC

## 2024-07-13 MED ORDER — ATORVASTATIN CALCIUM 20 MG PO TABS: 20.0000 mg | ORAL_TABLET | Freq: Every day | ORAL | 1 refills | Status: DC

## 2024-07-13 MED ORDER — DICLOFENAC SODIUM 1 % EX GEL: 4.0000 g | Freq: Four times a day (QID) | CUTANEOUS | 1 refills | Status: AC

## 2024-07-13 MED ORDER — AMLODIPINE BESYLATE 10 MG PO TABS: 10.0000 mg | ORAL_TABLET | Freq: Every day | ORAL | 1 refills | Status: DC

## 2024-07-13 MED ORDER — CHLORTHALIDONE 25 MG PO TABS: 25.0000 mg | ORAL_TABLET | Freq: Every day | ORAL | 1 refills | Status: AC

## 2024-07-13 MED ORDER — DULOXETINE HCL 60 MG PO CPEP: 60.0000 mg | ORAL_CAPSULE | Freq: Every day | ORAL | 1 refills | Status: DC

## 2024-07-13 MED ORDER — LIDOCAINE 5 % EX PTCH: 1.0000 | MEDICATED_PATCH | CUTANEOUS | 1 refills | Status: AC

## 2024-07-13 NOTE — Patient Instructions (Signed)
 VISIT SUMMARY:  You came in today because of acute back pain that started after you twisted while moving a flower pot. The pain is quite severe and has been affecting your daily activities. We also reviewed your hypertension management.  YOUR PLAN:  -ACUTE LOW BACK PAIN WITH MUSCLE SPASM: You have acute low back pain with muscle spasms, which means you have sudden pain in your lower back along with muscle tightness. We have prescribed Lidoderm  patches for pain relief and recommended using a hot towel or compress to help relax your muscles. You should also apply Voltaren  gel to the painful area and continue taking tizanidine  as needed. Gentle stretching exercises are encouraged to help with recovery.  -ESSENTIAL HYPERTENSION: Essential hypertension means you have high blood pressure. Your blood pressure was elevated today likely due to the pain, but it normalized on repeat measurement. Continue taking your current medications, chlorthalidone  and amlodipine , and follow a low sodium diet. We will see you again in six months to monitor your blood pressure.  INSTRUCTIONS:  Please follow up in six months for your hypertension management. If your back pain does not improve or worsens, contact our office for further evaluation.
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
## 2024-07-13 NOTE — Telephone Encounter (Signed)
 Pharmacy Patient Advocate Encounter  Received notification from [employer] that Prior Authorization for LIDOCAINE  5% PATCH has been APPROVED from 07/13/2024 to 08/05/2025   PA #/Case ID/Reference #: [id number]

## 2024-08-04 ENCOUNTER — Other Ambulatory Visit: Payer: Self-pay

## 2024-08-04 ENCOUNTER — Encounter (HOSPITAL_COMMUNITY): Payer: Self-pay

## 2024-08-04 ENCOUNTER — Emergency Department (HOSPITAL_COMMUNITY)

## 2024-08-04 ENCOUNTER — Inpatient Hospital Stay (HOSPITAL_COMMUNITY)
Admission: EM | Admit: 2024-08-04 | Discharge: 2024-08-07 | DRG: 065 | Disposition: A | Discharge status: Home Health | Attending: Infectious Diseases | Admitting: Infectious Diseases

## 2024-08-04 DIAGNOSIS — I2510 Atherosclerotic heart disease of native coronary artery without angina pectoris: Secondary | ICD-10-CM | POA: Diagnosis present

## 2024-08-04 DIAGNOSIS — G43109 Migraine with aura, not intractable, without status migrainosus: Secondary | ICD-10-CM | POA: Diagnosis not present

## 2024-08-04 DIAGNOSIS — Z955 Presence of coronary angioplasty implant and graft: Secondary | ICD-10-CM

## 2024-08-04 DIAGNOSIS — I10 Essential (primary) hypertension: Secondary | ICD-10-CM | POA: Diagnosis present

## 2024-08-04 DIAGNOSIS — G8191 Hemiplegia, unspecified affecting right dominant side: Secondary | ICD-10-CM | POA: Diagnosis present

## 2024-08-04 DIAGNOSIS — E785 Hyperlipidemia, unspecified: Secondary | ICD-10-CM | POA: Diagnosis present

## 2024-08-04 DIAGNOSIS — Z91041 Radiographic dye allergy status: Secondary | ICD-10-CM

## 2024-08-04 DIAGNOSIS — I6350 Cerebral infarction due to unspecified occlusion or stenosis of unspecified cerebral artery: Secondary | ICD-10-CM

## 2024-08-04 DIAGNOSIS — R29710 NIHSS score 10: Secondary | ICD-10-CM | POA: Diagnosis present

## 2024-08-04 DIAGNOSIS — Z800 Family history of malignant neoplasm of digestive organs: Secondary | ICD-10-CM

## 2024-08-04 DIAGNOSIS — Z87891 Personal history of nicotine dependence: Secondary | ICD-10-CM

## 2024-08-04 DIAGNOSIS — F32A Depression, unspecified: Secondary | ICD-10-CM | POA: Diagnosis present

## 2024-08-04 DIAGNOSIS — Z6841 Body Mass Index (BMI) 40.0 and over, adult: Secondary | ICD-10-CM

## 2024-08-04 DIAGNOSIS — Z91148 Patient's other noncompliance with medication regimen for other reason: Secondary | ICD-10-CM

## 2024-08-04 DIAGNOSIS — Z823 Family history of stroke: Secondary | ICD-10-CM

## 2024-08-04 DIAGNOSIS — K219 Gastro-esophageal reflux disease without esophagitis: Secondary | ICD-10-CM | POA: Diagnosis present

## 2024-08-04 DIAGNOSIS — Z79899 Other long term (current) drug therapy: Secondary | ICD-10-CM | POA: Diagnosis not present

## 2024-08-04 DIAGNOSIS — Z7982 Long term (current) use of aspirin: Secondary | ICD-10-CM

## 2024-08-04 DIAGNOSIS — I639 Cerebral infarction, unspecified: Principal | ICD-10-CM | POA: Diagnosis present

## 2024-08-04 DIAGNOSIS — R7303 Prediabetes: Secondary | ICD-10-CM | POA: Diagnosis present

## 2024-08-04 DIAGNOSIS — G4733 Obstructive sleep apnea (adult) (pediatric): Secondary | ICD-10-CM | POA: Diagnosis present

## 2024-08-04 DIAGNOSIS — D649 Anemia, unspecified: Secondary | ICD-10-CM | POA: Diagnosis present

## 2024-08-04 DIAGNOSIS — Z8249 Family history of ischemic heart disease and other diseases of the circulatory system: Secondary | ICD-10-CM

## 2024-08-04 DIAGNOSIS — Z7902 Long term (current) use of antithrombotics/antiplatelets: Secondary | ICD-10-CM

## 2024-08-04 DIAGNOSIS — I6389 Other cerebral infarction: Principal | ICD-10-CM | POA: Diagnosis present

## 2024-08-04 DIAGNOSIS — R29810 Facial weakness: Secondary | ICD-10-CM | POA: Diagnosis present

## 2024-08-04 DIAGNOSIS — F419 Anxiety disorder, unspecified: Secondary | ICD-10-CM | POA: Diagnosis present

## 2024-08-04 LAB — LIPID PANEL
Cholesterol: 149 mg/dL (ref 0–200)
HDL: 63 mg/dL
LDL Cholesterol: 73 mg/dL (ref 0–99)
Total CHOL/HDL Ratio: 2.4 ratio
Triglycerides: 62 mg/dL
VLDL: 12 mg/dL (ref 0–40)

## 2024-08-04 LAB — DIFFERENTIAL
Abs Immature Granulocytes: 0.01 K/uL (ref 0.00–0.07)
Basophils Absolute: 0 K/uL (ref 0.0–0.1)
Basophils Relative: 0 %
Eosinophils Absolute: 0 K/uL (ref 0.0–0.5)
Eosinophils Relative: 1 %
Immature Granulocytes: 0 %
Lymphocytes Relative: 44 %
Lymphs Abs: 2.6 K/uL (ref 0.7–4.0)
Monocytes Absolute: 0.5 K/uL (ref 0.1–1.0)
Monocytes Relative: 8 %
Neutro Abs: 2.8 K/uL (ref 1.7–7.7)
Neutrophils Relative %: 47 %

## 2024-08-04 LAB — CBG MONITORING, ED: Glucose-Capillary: 91 mg/dL (ref 70–99)

## 2024-08-04 LAB — I-STAT CHEM 8, ED
BUN: 12 mg/dL (ref 8–23)
Calcium, Ion: 1.11 mmol/L — ABNORMAL LOW (ref 1.15–1.40)
Chloride: 104 mmol/L (ref 98–111)
Creatinine, Ser: 1 mg/dL (ref 0.44–1.00)
Glucose, Bld: 85 mg/dL (ref 70–99)
HCT: 33 % — ABNORMAL LOW (ref 36.0–46.0)
Hemoglobin: 11.2 g/dL — ABNORMAL LOW (ref 12.0–15.0)
Potassium: 3.8 mmol/L (ref 3.5–5.1)
Sodium: 140 mmol/L (ref 135–145)
TCO2: 25 mmol/L (ref 22–32)

## 2024-08-04 LAB — COMPREHENSIVE METABOLIC PANEL WITH GFR
ALT: 20 U/L (ref 0–44)
AST: 23 U/L (ref 15–41)
Albumin: 3.8 g/dL (ref 3.5–5.0)
Alkaline Phosphatase: 86 U/L (ref 38–126)
Anion gap: 7 (ref 5–15)
BUN: 13 mg/dL (ref 8–23)
CO2: 28 mmol/L (ref 22–32)
Calcium: 9.3 mg/dL (ref 8.9–10.3)
Chloride: 105 mmol/L (ref 98–111)
Creatinine, Ser: 0.91 mg/dL (ref 0.44–1.00)
GFR, Estimated: >60 mL/min
Glucose, Bld: 89 mg/dL (ref 70–99)
Potassium: 4 mmol/L (ref 3.5–5.1)
Sodium: 140 mmol/L (ref 135–145)
Total Bilirubin: 0.4 mg/dL (ref 0.0–1.2)
Total Protein: 6.8 g/dL (ref 6.5–8.1)

## 2024-08-04 LAB — ETHANOL: Alcohol, Ethyl (B): <15 mg/dL

## 2024-08-04 LAB — URINE DRUG SCREEN
Amphetamines: NEGATIVE
Barbiturates: NEGATIVE
Benzodiazepines: NEGATIVE
Cocaine: NEGATIVE
Fentanyl: NEGATIVE
Methadone Scn, Ur: NEGATIVE
Opiates: NEGATIVE
Tetrahydrocannabinol: NEGATIVE

## 2024-08-04 LAB — CBC
HCT: 35.6 % — ABNORMAL LOW (ref 36.0–46.0)
Hemoglobin: 11.2 g/dL — ABNORMAL LOW (ref 12.0–15.0)
MCH: 29 pg (ref 26.0–34.0)
MCHC: 31.5 g/dL (ref 30.0–36.0)
MCV: 92.2 fL (ref 80.0–100.0)
Platelets: 203 K/uL (ref 150–400)
RBC: 3.86 MIL/uL — ABNORMAL LOW (ref 3.87–5.11)
RDW: 15.2 % (ref 11.5–15.5)
WBC: 6 K/uL (ref 4.0–10.5)
nRBC: 0 % (ref 0.0–0.2)

## 2024-08-04 LAB — PROTIME-INR
INR: 1.1 (ref 0.8–1.2)
Prothrombin Time: 14.8 s (ref 11.4–15.2)

## 2024-08-04 LAB — HIV ANTIBODY (ROUTINE TESTING W REFLEX): HIV Screen 4th Generation wRfx: NONREACTIVE

## 2024-08-04 LAB — APTT: aPTT: 26 s (ref 24–36)

## 2024-08-04 MED ORDER — IOHEXOL 350 MG/ML SOLN
100.0000 mL | Freq: Once | INTRAVENOUS | Status: AC | PRN
  Administered 2024-08-04: 100 mL via INTRAVENOUS

## 2024-08-04 MED ORDER — CLOPIDOGREL BISULFATE 300 MG PO TABS
300.0000 mg | ORAL_TABLET | Freq: Once | ORAL | Status: AC
  Administered 2024-08-04: 300 mg via ORAL
  Filled 2024-08-04: qty 1

## 2024-08-04 MED ORDER — ASPIRIN 325 MG PO TBEC
325.0000 mg | DELAYED_RELEASE_TABLET | Freq: Once | ORAL | Status: AC
  Administered 2024-08-04: 325 mg via ORAL
  Filled 2024-08-04: qty 1

## 2024-08-04 MED ORDER — LIDOCAINE 5 % EX PTCH
1.0000 | MEDICATED_PATCH | CUTANEOUS | Status: DC
  Administered 2024-08-04: 1 via TRANSDERMAL
  Filled 2024-08-04: qty 1

## 2024-08-04 MED ORDER — ACETAMINOPHEN 500 MG PO TABS: 1000.0000 mg | ORAL_TABLET | Freq: Four times a day (QID) | ORAL | Status: DC | PRN

## 2024-08-04 MED ORDER — CLOPIDOGREL BISULFATE 75 MG PO TABS
75.0000 mg | ORAL_TABLET | Freq: Every day | ORAL | Status: DC
  Administered 2024-08-05 – 2024-08-07 (×3): 75 mg via ORAL
  Filled 2024-08-04 (×3): qty 1

## 2024-08-04 MED ORDER — ATORVASTATIN CALCIUM 40 MG PO TABS
40.0000 mg | ORAL_TABLET | Freq: Every day | ORAL | Status: DC
  Administered 2024-08-04 – 2024-08-07 (×4): 40 mg via ORAL
  Filled 2024-08-04 (×4): qty 1

## 2024-08-04 MED ORDER — ENOXAPARIN SODIUM 40 MG/0.4ML IJ SOSY
40.0000 mg | PREFILLED_SYRINGE | INTRAMUSCULAR | Status: DC
  Administered 2024-08-04: 40 mg via SUBCUTANEOUS
  Filled 2024-08-04: qty 0.4

## 2024-08-04 MED ORDER — ASPIRIN 81 MG PO TBEC
81.0000 mg | DELAYED_RELEASE_TABLET | Freq: Every day | ORAL | Status: DC
  Administered 2024-08-05 – 2024-08-07 (×3): 81 mg via ORAL
  Filled 2024-08-04 (×3): qty 1

## 2024-08-04 MED ORDER — ENOXAPARIN SODIUM 40 MG/0.4ML IJ SOSY
40.0000 mg | PREFILLED_SYRINGE | INTRAMUSCULAR | Status: DC
  Administered 2024-08-04 – 2024-08-06 (×3): 40 mg via SUBCUTANEOUS
  Filled 2024-08-04 (×3): qty 0.4

## 2024-08-04 MED ORDER — LORAZEPAM 2 MG/ML IJ SOLN
1.0000 mg | Freq: Once | INTRAMUSCULAR | Status: AC
  Administered 2024-08-04: 1 mg via INTRAVENOUS
  Filled 2024-08-04: qty 1

## 2024-08-04 MED ORDER — ALBUTEROL SULFATE (2.5 MG/3ML) 0.083% IN NEBU: 2.5000 mg | INHALATION_SOLUTION | Freq: Four times a day (QID) | RESPIRATORY_TRACT | Status: DC | PRN
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
## 2024-08-04 NOTE — Hospital Course (Addendum)
 Stroke vs migraine -> stroke per MRI [facility] seeing Stroke workup R side weakness, vision changes, slurred speech Last known well 2100 on 12/29    This started this morning when she noticed world slurring and speech difficulties after she awoke. She has R sided weakness as well and had difficulty walking this morning. She also could not see out of her L eye this morning, at this point she has blurriness.  She reported some chest pain as well that is similar to prior episodes in her past.  She does not have swelling normally.  She normally sleeps on 2-3 pillows.    6 months only taking: Atorvastatin  20 Chlorthalidone  25    She feels better this morning - attributed to improves R side strength, less dysarthria. She walks with assistance (vs independent with cane at baseline). She denies other acute concerns.

## 2024-08-04 NOTE — ED Notes (Signed)
 RN Call CCMD from the bedside at [phone] to place patient on the telemetry.

## 2024-08-04 NOTE — ED Notes (Signed)
 Patient transported to MRI

## 2024-08-04 NOTE — ED Provider Notes (Signed)
 Internal medicine teaching service is aware of case and will evaluate for admission.   Laurice Maude BROCKS, MD 08/04/24 [phone]

## 2024-08-04 NOTE — ED Notes (Signed)
 Patient ambulated to restroom with fairly steady gait. Favoring right leg while walking.

## 2024-08-04 NOTE — Code Documentation (Signed)
 Stroke Response Nurse Documentation Code Documentation  Cathy [name] is a 63 y.o. female arriving to [facility]  via Private Vehicle on 12/30 with past medical hx of HTN, OSA, anxiety, GERD. On No antithrombotic. Code stroke was activated by ED.   Patient from home where she was LKW at 2100 on 12/29 and now complaining of R sided weakness and slurred speech, which she states she awoke with this morning.   Stroke team at the bedside on patient arrival. Labs drawn and patient cleared for CT by Dr. Ruthe. Patient to CT with team. NIHSS 10, see documentation for details and code stroke times. Patient with right arm weakness, bilateral leg weakness, right decreased sensation, and dysarthria  on exam. The following imaging was completed:  CT Head, CTA, and CTP. Patient is not a candidate for IV Thrombolytic due to LKW 2100 yesterday. Patient is not a candidate for IR due to no LVO on CTA.   Care Plan: q2 NIHSS and vitals, NPO until passes swallow screen, MRI.   Bedside handoff with ED RN Scarlett.    [name]  Stroke Response RN

## 2024-08-04 NOTE — ED Triage Notes (Signed)
 Pt arrives via POV. Pt reports right sided weakness in arm and leg, vision changes, and slurred speech since this morning. LKW is 2100 last night.
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
## 2024-08-04 NOTE — ED Provider Notes (Signed)
 " [place] EMERGENCY DEPARTMENT AT [facility] Provider Note   CSN: [id number] Arrival date & time: 08/04/24  1023  An emergency department physician performed an initial assessment on this suspected stroke patient at 1034.  Patient presents with: Code Stroke   Cathy [name] is a 63 y.o. female.   Pt is a 63y/o female with hx of hypertension, hyperlipidemia, migraine HA who is presenting today as a code stroke.  Patient reports that she went to bed last night feeling normal at around 9 PM but when she woke up this morning she was having weakness in her right arm and leg with some blurry vision in her eye and some slurred speech this morning.  Reports she had a similar event last week but not as severe.  At this time she does not have a headache.  She has not had any nausea, vomiting, cough or fever.  She denies any chest pain or shortness of breath.  EMS reported elevated blood pressure but normal blood sugar.  The history is provided by the patient, medical records and the EMS personnel.       Prior to Admission medications  Medication Sig Start Date End Date Taking? Authorizing Provider  acetaminophen  (TYLENOL ) 500 MG tablet Take 2 tablets (1,000 mg total) by mouth every 6 (six) hours as needed. 04/17/22  Yes Enedelia Dorna HERO, FNP  albuterol  (VENTOLIN  HFA) 108 (90 Base) MCG/ACT inhaler Inhale 1-2 puffs into the lungs every 6 (six) hours as needed for wheezing or shortness of breath. 10/09/23  Yes Newlin, Enobong, MD  amLODipine  (NORVASC ) 10 MG tablet Take 1 tablet (10 mg total) by mouth daily. 07/13/24  Yes Newlin, Enobong, MD  atorvastatin  (LIPITOR) 20 MG tablet Take 1 tablet (20 mg total) by mouth daily. 07/13/24  Yes Newlin, Enobong, MD  cetirizine  (ZYRTEC ) 10 MG tablet Take 1 tablet (10 mg total) by mouth daily. Patient taking differently: Take 10 mg by mouth daily as needed for allergies. 11/06/22  Yes Newlin, Enobong, MD  chlorthalidone  (HYGROTON ) 25 MG tablet Take 1  tablet (25 mg total) by mouth daily. 07/13/24  Yes Newlin, Enobong, MD  diclofenac  Sodium (VOLTAREN ) 1 % GEL Apply 4 g topically 4 (four) times daily. Patient taking differently: Apply 4 g topically every 6 (six) hours as needed. 07/13/24  Yes Newlin, Enobong, MD  fluticasone  (FLONASE ) 50 MCG/ACT nasal spray Place 2 sprays into both nostrils daily. 05/22/21  Yes Brien Belvie BRAVO, MD  lidocaine  (LIDODERM ) 5 % Place 1 patch onto the skin daily. Remove & Discard patch within 12 hours or as directed by MD 07/13/24  Yes Delbert Clam, MD  nitroGLYCERIN  (NITROSTAT ) 0.4 MG SL tablet Place 1 tablet (0.4 mg total) under the tongue every 5 (five) minutes as needed for chest pain. 10/09/23 08/04/24 Yes Newlin, Enobong, MD  tiZANidine  (ZANAFLEX ) 4 MG tablet Take 1 tablet (4 mg total) by mouth 3 (three) times daily as needed for muscle spasms. 07/13/24  Yes Newlin, Enobong, MD  aspirin  EC 81 MG tablet Take 1 tablet (81 mg total) by mouth 2 (two) times daily. Patient not taking: Reported on 08/04/2024 01/06/20   McClung, Angela M, PA-C  DULoxetine  (CYMBALTA ) 60 MG capsule Take 1 capsule (60 mg total) by mouth daily. For back pain Patient not taking: Reported on 08/04/2024 07/13/24   Newlin, Enobong, MD  fluticasone  (FLOVENT  HFA) 44 MCG/ACT inhaler Inhale 2 puffs into the lungs 2 (two) times daily. Patient not taking: Reported on 08/04/2024 11/06/22  Newlin, Enobong, MD  HYDROcodone -acetaminophen  (NORCO/VICODIN) 5-325 MG tablet Take 1 tablet by mouth every 6 (six) hours as needed. Patient not taking: Reported on 07/13/2024 02/12/23   Laurice Maude BROCKS, MD  ibuprofen  (ADVIL ) 600 MG tablet Take 1 tablet (600 mg total) by mouth every 6 (six) hours as needed. Patient not taking: Reported on 08/04/2024 04/17/22   Enedelia Dorna HERO, FNP  Misc. Devices MISC CPAP, AutoPap 5 - 20 cm water.  Diagnosis-obstructive sleep apnea 10/06/21   Newlin, Enobong, MD  SUMAtriptan  (IMITREX ) 50 MG tablet Take 1 tablet (50 mg total) by mouth every  2 (two) hours as needed for migraine. May repeat in 2 hours if headache persists or recurs. Patient not taking: Reported on 08/04/2024 05/17/22   Rush Nest, MD  topiramate  (TOPAMAX ) 100 MG tablet Take 1 tablet (100 mg total) by mouth 2 (two) times daily. Patient not taking: Reported on 08/04/2024 06/13/22   Newlin, Enobong, MD    Allergies: Isovue  [iopamidol ]    Review of Systems  Updated Vital Signs BP (!) 146/86   Pulse (!) 59   Temp (!) 97.5 F (36.4 C) (Oral)   Resp 14   Wt 122.2 kg   SpO2 99%   BMI 46.24 kg/m   Physical Exam Vitals and nursing note reviewed.  Constitutional:      General: She is not in acute distress.    Appearance: She is well-developed.  HENT:     Head: Normocephalic and atraumatic.  Eyes:     Pupils: Pupils are equal, round, and reactive to light.  Cardiovascular:     Rate and Rhythm: Normal rate and regular rhythm.     Heart sounds: Normal heart sounds. No murmur heard.    No friction rub.  Pulmonary:     Effort: Pulmonary effort is normal.     Breath sounds: Normal breath sounds. No wheezing or rales.  Abdominal:     General: Bowel sounds are normal. There is no distension.     Palpations: Abdomen is soft.     Tenderness: There is no abdominal tenderness. There is no guarding or rebound.  Musculoskeletal:        General: No tenderness. Normal range of motion.     Comments: No edema  Skin:    General: Skin is warm and dry.     Findings: No rash.  Neurological:     Mental Status: She is alert and oriented to person, place, and time.     Comments: No notable facial droop noted.  Speech is clear without aphasia.  Patient on exam reports she is not able to move her foot and ankle but is able to ambulate and foot is noted to be moving at that time.  No significant pronator drift in the upper extremities.  Psychiatric:        Behavior: Behavior normal.     (all labs ordered are listed, but only abnormal results are displayed) Labs  Reviewed  CBC - Abnormal; Notable for the following components:      Result Value   RBC 3.86 (*)    Hemoglobin 11.2 (*)    HCT 35.6 (*)    All other components within normal limits  I-STAT CHEM 8, ED - Abnormal; Notable for the following components:   Calcium , Ion 1.11 (*)    Hemoglobin 11.2 (*)    HCT 33.0 (*)    All other components within normal limits  PROTIME-INR  APTT  DIFFERENTIAL  COMPREHENSIVE METABOLIC PANEL WITH GFR  ETHANOL  URINE DRUG SCREEN  CBG MONITORING, ED    EKG: EKG Interpretation Date/Time:  Tuesday August 04 2024 11:39:35 EST Ventricular Rate:  54 PR Interval:  170 QRS Duration:  99 QT Interval:  448 QTC Calculation: 425 R Axis:   0  Text Interpretation: Sinus rhythm Abnormal R-wave progression, early transition Inferior infarct, old No significant change since last tracing Confirmed by Doretha Folks ([id number]) on 08/04/2024 2:29:59 PM  Radiology: MR BRAIN WO CONTRAST Result Date: 08/04/2024 EXAM: MRI BRAIN WITHOUT CONTRAST 08/04/2024 01:26:20 PM TECHNIQUE: Multiplanar multisequence MRI of the head/brain was performed without the administration of intravenous contrast. COMPARISON: Same day CT head and CTA head and neck. CLINICAL HISTORY: Neuro deficit, acute, stroke suspected. FINDINGS: BRAIN AND VENTRICLES: 8 mm focus of acute infarct within the right centrum semiovale. There are additional scattered areas of acute infarct involving the cortex and subcortical white matter within the posterior right frontal lobe including portions of the lateral right precentral gyrus. T2 and FLAIR hyperintensity in the periventricular and subcortical white matter compatible with mild chronic microvascular ischemic changes. Multiple small remote infarcts in the bilateral cerebellum. Small remote infarcts in the bilateral centrum semiovale. There is asymmetric prominence of the left Meckel's cave with a 5 mm rounded focus of soft tissue within which could reflect a  schwannoma or meningioma versus other lesions. Recommend nonemergent correlation with MRI of the skull base with and without contrast. No intracranial hemorrhage. No midline shift. No hydrocephalus. The sella is unremarkable. Normal flow voids. ORBITS: No acute abnormality. SINUSES AND MASTOIDS: Retention cyst in the left maxillary sinus. BONES AND SOFT TISSUES: Normal marrow signal. No acute soft tissue abnormality. IMPRESSION: 1. Acute infarcts in the right centrum semiovale and posterior right frontal lobe, including the lateral right precentral gyrus. 2. Multiple small remote infarcts in the bilateral cerebellum and bilateral centrum semiovale. 3. Asymmetric prominence of the left Meckel cave with a 5 mm rounded focus of soft tissue, which may represent a schwannoma or meningioma versus other lesion; recommend nonemergent MRI of the skull base with and without contrast. Electronically signed by: Donnice Mania MD 08/04/2024 03:07 PM EST RP Workstation: [id number]   CT ANGIO HEAD NECK W WO CM W PERF (CODE STROKE) Result Date: 08/04/2024 EXAM: CTA Head and Neck with Perfusion 08/04/2024 10:56:20 AM TECHNIQUE: CTA of the head and neck was performed with and without the administration of 100 mL of iohexol  (OMNIPAQUE ) 350 MG/ML injection. 3D postprocessing with multiplanar reconstructions and MIPs was performed to evaluate the vascular anatomy. Cerebral perfusion analysis using computed tomography with contrast administration, including post-processing of parametric maps with determination of cerebral blood flow, cerebral blood volume, mean transit time and time-to-maximum. Automated exposure control, iterative reconstruction, and/or weight based adjustment of the mA/kV was utilized to reduce the radiation dose to as low as reasonably achievable. COMPARISON: CT of the head dated 08/04/2024. CLINICAL HISTORY: Neuro deficit, acute, stroke suspected. FINDINGS: CTA NECK: AORTIC ARCH AND ARCH VESSELS: No dissection or  arterial injury. No significant stenosis of the brachiocephalic or subclavian arteries. CERVICAL CAROTID ARTERIES: No dissection, arterial injury, or hemodynamically significant stenosis by NASCET criteria. CERVICAL VERTEBRAL ARTERIES: No dissection, arterial injury, or significant stenosis. LUNGS AND MEDIASTINUM: Unremarkable. SOFT TISSUES: No acute abnormality. BONES: No acute abnormality. CTA HEAD: ANTERIOR CIRCULATION: No significant stenosis of the internal carotid arteries. No significant stenosis of the anterior cerebral arteries. No significant stenosis of the middle cerebral arteries. No aneurysm. POSTERIOR CIRCULATION: No significant stenosis of the posterior cerebral arteries.  No significant stenosis of the basilar artery. No significant stenosis of the vertebral arteries. No aneurysm. OTHER: No dural venous sinus thrombosis on this non-dedicated study. The above findings were communicated to Doctor Michaela at 11:11 AM on 08/04/2024. CT PERFUSION: EXAM QUALITY: Exam quality is adequate with diagnostic perfusion maps. No significant motion artifact. Appropriate arterial inflow and venous outflow curves. CORE INFARCT (CBF<30% volume): 0 mL TOTAL HYPOPERFUSION (Tmax>6s volume): 0 mL PENUMBRA: Mismatch volume: 0 mL Mismatch ratio: not applicable Location: not applicable IMPRESSION: 1. No acute large vessel occlusion. Comparison is CT head dated 08/04/24. 2. No hemodynamically significant stenosis or aneurysm in the head or neck vessels. 3. No evidence of ischemia by CT brain perfusion. 4. The above findings were communicated to Dr. Michaela at 11:11 AM on 08/04/24. Electronically signed by: Evalene Coho MD 08/04/2024 11:13 AM EST RP Workstation: [id number]   CT HEAD CODE STROKE WO CONTRAST (LKW 0-4.5h, LVO 0-24h) Result Date: 08/04/2024 EXAM: CT HEAD WITHOUT CONTRAST 08/04/2024 10:41:00 AM TECHNIQUE: CT of the head was performed without the administration of intravenous contrast. Automated  exposure control, iterative reconstruction, and/or weight based adjustment of the mA/kV was utilized to reduce the radiation dose to as low as reasonably achievable. COMPARISON: CT of the head dated 09/26/2023. CLINICAL HISTORY: Neuro deficit, acute, stroke suspected. FINDINGS: BRAIN AND VENTRICLES: No acute hemorrhage. No evidence of acute infarct. No hydrocephalus. No extra-axial collection. No mass effect or midline shift. [place] Stroke Program Early CT Score (ASPECTS): Ganglionic (caudate, IC, lentiform nucleus, insula, M1-M3): 7. Supraganglionic (M4-M6): 3. Total: 10. ORBITS: No acute abnormality. SINUSES: Mild polypoid mucosal disease within the maxillary sinuses bilaterally. SOFT TISSUES AND SKULL: No acute soft tissue abnormality. No skull fracture. IMPRESSION: 1. No acute intracranial abnormality in the setting of suspected acute stroke. 2. Mild polypoid mucosal disease in the maxillary sinuses bilaterally. 3. Findings cummunicated to Dr. Michaela at 10:48 AM on 08/04/24. Aspects: 10 Electronically signed by: Evalene Coho MD 08/04/2024 10:50 AM EST RP Workstation: [id number]     Procedures   Medications Ordered in the ED  iohexol  (OMNIPAQUE ) 350 MG/ML injection 100 mL (100 mLs Intravenous Contrast Given 08/04/24 1056)  LORazepam  (ATIVAN ) injection 1 mg (1 mg Intravenous Given 08/04/24 1244)                                    Medical Decision Making Amount and/or Complexity of Data Reviewed Independent Historian: EMS External Data Reviewed: notes. Labs: ordered. Decision-making details documented in ED Course. Radiology: ordered and independent interpretation performed. Decision-making details documented in ED Course. ECG/medicine tests: ordered and independent interpretation performed. Decision-making details documented in ED Course.  Risk Prescription drug management. Decision regarding hospitalization.   Pt with multiple medical problems and comorbidities and presenting  today with a complaint that caries a high risk for morbidity and mortality.  Here today with the above symptoms.  Concern for stroke versus complicated migraine.  Lower suspicion for infectious etiology, intracranial hemorrhage.  Will also rule out electrolyte abnormalities.  Patient's blood sugar is normal.  Neurology was present upon patient's arrival and patient went straight to CT scan. I have independently visualized and interpreted pt's images today.  No evidence of LVO on CT.  Radiology reports no acute large vessel occlusion or hemodynamically significant stenosis or aneurysm and no evidence of ischemia on brain perfusion scan.  After speaking with neurology they recommended an MRI and if that is normal  try migraine treatments. I independently interpreted patient's labs and EKG.  EKG without acute findings, CBC, CMP, UDS, EtOH, Chem-8 and PT all within normal limits. MRI came back with positive infarcts that are acute.  Patient will require admission for stroke workup.  Discussed this with the patient and will admit to the hospitalist.     Final diagnoses:  Acute ischemic stroke [facility])    ED Discharge Orders     None          Doretha Folks, MD 08/04/24 1532  "

## 2024-08-04 NOTE — ED Notes (Signed)
 91 BS not crossing over

## 2024-08-05 ENCOUNTER — Observation Stay (HOSPITAL_COMMUNITY)

## 2024-08-05 DIAGNOSIS — Z8249 Family history of ischemic heart disease and other diseases of the circulatory system: Secondary | ICD-10-CM | POA: Diagnosis not present

## 2024-08-05 DIAGNOSIS — I63411 Cerebral infarction due to embolism of right middle cerebral artery: Secondary | ICD-10-CM

## 2024-08-05 DIAGNOSIS — Z7982 Long term (current) use of aspirin: Secondary | ICD-10-CM | POA: Diagnosis not present

## 2024-08-05 DIAGNOSIS — F418 Other specified anxiety disorders: Secondary | ICD-10-CM | POA: Diagnosis not present

## 2024-08-05 DIAGNOSIS — I2510 Atherosclerotic heart disease of native coronary artery without angina pectoris: Secondary | ICD-10-CM | POA: Diagnosis present

## 2024-08-05 DIAGNOSIS — Z6841 Body Mass Index (BMI) 40.0 and over, adult: Secondary | ICD-10-CM | POA: Diagnosis not present

## 2024-08-05 DIAGNOSIS — D649 Anemia, unspecified: Secondary | ICD-10-CM | POA: Diagnosis present

## 2024-08-05 DIAGNOSIS — Z87891 Personal history of nicotine dependence: Secondary | ICD-10-CM | POA: Diagnosis not present

## 2024-08-05 DIAGNOSIS — R29706 NIHSS score 6: Secondary | ICD-10-CM | POA: Diagnosis not present

## 2024-08-05 DIAGNOSIS — R29810 Facial weakness: Secondary | ICD-10-CM | POA: Diagnosis present

## 2024-08-05 DIAGNOSIS — K219 Gastro-esophageal reflux disease without esophagitis: Secondary | ICD-10-CM | POA: Diagnosis present

## 2024-08-05 DIAGNOSIS — R7303 Prediabetes: Secondary | ICD-10-CM

## 2024-08-05 DIAGNOSIS — I639 Cerebral infarction, unspecified: Secondary | ICD-10-CM | POA: Diagnosis present

## 2024-08-05 DIAGNOSIS — Z800 Family history of malignant neoplasm of digestive organs: Secondary | ICD-10-CM | POA: Diagnosis not present

## 2024-08-05 DIAGNOSIS — I6389 Other cerebral infarction: Secondary | ICD-10-CM

## 2024-08-05 DIAGNOSIS — Z79899 Other long term (current) drug therapy: Secondary | ICD-10-CM | POA: Diagnosis not present

## 2024-08-05 DIAGNOSIS — F32A Depression, unspecified: Secondary | ICD-10-CM | POA: Diagnosis present

## 2024-08-05 DIAGNOSIS — Z91148 Patient's other noncompliance with medication regimen for other reason: Secondary | ICD-10-CM | POA: Diagnosis not present

## 2024-08-05 DIAGNOSIS — G8191 Hemiplegia, unspecified affecting right dominant side: Secondary | ICD-10-CM | POA: Diagnosis present

## 2024-08-05 DIAGNOSIS — G4733 Obstructive sleep apnea (adult) (pediatric): Secondary | ICD-10-CM | POA: Diagnosis present

## 2024-08-05 DIAGNOSIS — R29710 NIHSS score 10: Secondary | ICD-10-CM | POA: Diagnosis present

## 2024-08-05 DIAGNOSIS — Z955 Presence of coronary angioplasty implant and graft: Secondary | ICD-10-CM | POA: Diagnosis not present

## 2024-08-05 DIAGNOSIS — I10 Essential (primary) hypertension: Secondary | ICD-10-CM | POA: Diagnosis present

## 2024-08-05 DIAGNOSIS — Z7902 Long term (current) use of antithrombotics/antiplatelets: Secondary | ICD-10-CM | POA: Diagnosis not present

## 2024-08-05 DIAGNOSIS — E785 Hyperlipidemia, unspecified: Secondary | ICD-10-CM | POA: Diagnosis present

## 2024-08-05 DIAGNOSIS — Z823 Family history of stroke: Secondary | ICD-10-CM | POA: Diagnosis not present

## 2024-08-05 DIAGNOSIS — F419 Anxiety disorder, unspecified: Secondary | ICD-10-CM | POA: Diagnosis present

## 2024-08-05 LAB — CBC
HCT: 36.1 % (ref 36.0–46.0)
Hemoglobin: 11.3 g/dL — ABNORMAL LOW (ref 12.0–15.0)
MCH: 29 pg (ref 26.0–34.0)
MCHC: 31.3 g/dL (ref 30.0–36.0)
MCV: 92.8 fL (ref 80.0–100.0)
Platelets: 197 K/uL (ref 150–400)
RBC: 3.89 MIL/uL (ref 3.87–5.11)
RDW: 15.5 % (ref 11.5–15.5)
WBC: 6 K/uL (ref 4.0–10.5)
nRBC: 0 % (ref 0.0–0.2)

## 2024-08-05 LAB — ECHOCARDIOGRAM COMPLETE
AR max vel: 2.42 cm2
AV Area VTI: 2.88 cm2
AV Area mean vel: 2.52 cm2
AV Mean grad: 4 mmHg
AV Peak grad: 6.2 mmHg
Ao pk vel: 1.24 m/s
Area-P 1/2: 2.31 cm2
S' Lateral: 2.9 cm
Weight: 4310.43 [oz_av]

## 2024-08-05 LAB — BASIC METABOLIC PANEL WITH GFR
Anion gap: 9 (ref 5–15)
BUN: 16 mg/dL (ref 8–23)
CO2: 25 mmol/L (ref 22–32)
Calcium: 9.5 mg/dL (ref 8.9–10.3)
Chloride: 107 mmol/L (ref 98–111)
Creatinine, Ser: 1.07 mg/dL — ABNORMAL HIGH (ref 0.44–1.00)
GFR, Estimated: 59 mL/min — ABNORMAL LOW
Glucose, Bld: 106 mg/dL — ABNORMAL HIGH (ref 70–99)
Potassium: 3.8 mmol/L (ref 3.5–5.1)
Sodium: 141 mmol/L (ref 135–145)

## 2024-08-05 LAB — HEMOGLOBIN A1C
Hgb A1c MFr Bld: 5.9 % — ABNORMAL HIGH (ref 4.8–5.6)
Mean Plasma Glucose: 122.63 mg/dL

## 2024-08-05 MED ORDER — STROKE: EARLY STAGES OF RECOVERY BOOK
Freq: Once | Status: AC
  Filled 2024-08-05: qty 1

## 2024-08-05 NOTE — Evaluation (Signed)
 Physical Therapy Evaluation Patient Details Name: Cathy [name] MRN: [id number] DOB: 1961-06-29 Today's Date: 08/05/2024  History of Present Illness  Pt is a 63 year old woman who presented on R side weakness, dysarthria and L eye blurriness. 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  Clinical Impression  Pt presents to PT with generalized weakness and no functional focal weakness. At baseline pt is very sedentary. Likely isn't too far from baseline. Pt felt more stable using rollator  for amb than cane and has access to rollator at home. Recommend OPPT at DC.         If plan is discharge home, recommend the following: Help with stairs or ramp for entrance;Assist for transportation   Can travel by private vehicle        Equipment Recommendations None recommended by PT  Recommendations for Other Services       Functional Status Assessment Patient has had a recent decline in their functional status and demonstrates the ability to make significant improvements in function in a reasonable and predictable amount of time.     Precautions / Restrictions Precautions Precautions: Fall Restrictions Weight Bearing Restrictions Per Provider Order: No      Mobility  Bed Mobility Overal bed mobility: Modified Independent             General bed mobility comments: HOB up, increased effort    Transfers Overall transfer level: Needs assistance Equipment used: Rollator (4 wheels) Transfers: Sit to/from Stand Sit to Stand: Contact guard assist           General transfer comment: for safety    Ambulation/Gait Ambulation/Gait assistance: Supervision Gait Distance (Feet): 125 Feet Assistive device: Rollator (4 wheels) Gait Pattern/deviations: Step-through pattern, Decreased stride length Gait velocity: decr Gait velocity interpretation: 1.31 - 2.62 ft/sec, indicative of limited community  ambulator   General Gait Details: slow, steady gait with rollator  Stairs            Wheelchair Mobility     Tilt Bed    Modified Rankin (Stroke Patients Only) Modified Rankin (Stroke Patients Only) Pre-Morbid Rankin Score: Slight disability Modified Rankin: Slight disability     Balance Overall balance assessment: Needs assistance Sitting-balance support: No upper extremity supported Sitting balance-Leahy Scale: Good     Standing balance support: No upper extremity supported Standing balance-Leahy Scale: Fair                               Pertinent Vitals/Pain Pain Assessment Pain Assessment: 0-10 Faces Pain Scale: Hurts little more Pain Location: back Pain Descriptors / Indicators: Aching Pain Intervention(s): Monitored during session    Home Living Family/patient expects to be discharged to:: Private residence Living Arrangements: Spouse/significant other Available Help at Discharge: Family;Available PRN/intermittently (husband works long hours) Type of Home: Apartment Home Access: Level entry       Home Layout: One level Home Equipment: Agricultural Consultant (2 wheels);Cane - single point;Rollator (4 wheels)      Prior Function Prior Level of Function : Needs assist             Mobility Comments: walks with cane in the house, rarely leaves the house ADLs Comments: sponge bathes except when husband is there to help on weekends, family brings in meals and groceries and does cleaning, struggles with LB dressing, mostly wears slip on shoes  Extremity/Trunk Assessment   Upper Extremity Assessment Upper Extremity Assessment: Defer to OT evaluation    Lower Extremity Assessment Lower Extremity Assessment: Generalized weakness (No focal weakness)    Cervical / Trunk Assessment Cervical / Trunk Assessment: Other exceptions (obesity, chronic back pain)  Communication   Communication Communication: No apparent difficulties     Cognition Arousal: Alert Behavior During Therapy: WFL for tasks assessed/performed   PT - Cognitive impairments: Memory                       PT - Cognition Comments: Pt reports baseline decr memory Following commands: Intact       Cueing Cueing Techniques: Verbal cues     General Comments      Exercises     Assessment/Plan    PT Assessment Patient needs continued PT services  PT Problem List Decreased strength;Decreased activity tolerance;Decreased balance;Decreased mobility;Obesity       PT Treatment Interventions DME instruction;Gait training;Functional mobility training    PT Goals (Current goals can be found in the Care Plan section)  Acute Rehab PT Goals Patient Stated Goal: return home PT Goal Formulation: With patient Time For Goal Achievement: 08/19/24 Potential to Achieve Goals: Good    Frequency Min 2X/week     Co-evaluation               AM-PAC PT 6 Clicks Mobility  Outcome Measure Help needed turning from your back to your side while in a flat bed without using bedrails?: None Help needed moving from lying on your back to sitting on the side of a flat bed without using bedrails?: None Help needed moving to and from a bed to a chair (including a wheelchair)?: A Little Help needed standing up from a chair using your arms (e.g., wheelchair or bedside chair)?: A Little Help needed to walk in hospital room?: A Little Help needed climbing 3-5 steps with a railing? : A Little 6 Click Score: 20    End of Session Equipment Utilized During Treatment: Gait belt Activity Tolerance: Patient tolerated treatment well Patient left: in chair;with call bell/phone within reach   PT Visit Diagnosis: Other abnormalities of gait and mobility (R26.89);Muscle weakness (generalized) (M62.81)    Time: 8759-8743 PT Time Calculation (min) (ACUTE ONLY): 16 min   Charges:   PT Evaluation $PT Eval Moderate Complexity: 1 Mod   PT General Charges $$  ACUTE PT VISIT: 1 Visit         [facility] PT Acute Rehabilitation Services Office [phone]   [place] ORN [facility] 08/05/2024, 3:10 PM

## 2024-08-05 NOTE — Progress Notes (Signed)
" °   08/05/24 1056  TOC ED Mini Assessment  TOC Time spent with patient (minutes): 20  Admission or Readmission Diverted Yes  Means of departure Car  CMS Medicare.gov Compare Post Acute Care list provided to: Patient  Choice offered to / list presented to  NA   MOON letter signed and given.   Transition of Care [facility]) - Emergency Department Mini Assessment   Patient Details  Name: Cathy [name] MRN: [id number] Date of Birth: Oct 06, 1960  Transition of Care [facility]) CM/SW Contact:    Camelia JONETTA Cary, RN Phone Number: 08/05/2024, 10:58 AM   Clinical Narrative:  RNCM spoke to patient regarding d/c plans. Pt. Stated that she will return home and spouse/family will transport. Pt. Has a walker and cane at home. No needs noted at this time. Will continue to follow.    ED Mini Assessment:          Means of departure: (P) Car           ,           CMS Medicare.gov Compare Post Acute Care list provided to:: (P) Patient Choice offered to / list presented to : (P) NA  Admission diagnosis:  Acute ischemic stroke [facility]) [I63.9] Patient Active Problem List   Diagnosis Date Noted   Acute ischemic stroke (HCC) 08/04/2024   Gastroesophageal reflux 01/19/2021   Asthma, moderate persistent 05/07/2018   Obesity, Class III, BMI 40-49.9 (morbid obesity) (HCC) 05/07/2018   Anxiety and depression 10/09/2017   Abnormal stress test, catheterization done after that showed normal coronaries 05/17/2017   Obstructive sleep apnea 04/18/2017   Hypertension 04/08/2015   Right shoulder pain 04/08/2015   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

## 2024-08-05 NOTE — ED Notes (Signed)
 Contacted floor to let them know pt is on the way up!

## 2024-08-05 NOTE — Evaluation (Signed)
 Occupational Therapy Evaluation Patient Details Name: Cathy [name] MRN: [id number] DOB: Dec 19, 1960 Today's Date: 08/05/2024   History of Present Illness   Pt is a 63 year old woman who presented on R side weakness, dysarthria and L eye blurriness. 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     Clinical Impressions Pt typically walks with a cane, she rarely leaves her home. Pt sponge bathes unless her husband is at home to help her in and out of the tub. She reports difficulty with LB ADLs and uses slip on shoes. Pt is dependent in meal prep and housekeeping. Pt endorses short term memory deficits recently and getting lost when she drives. She also reports new blurriness in her L eye, no difficulty seeing her cell phone or navigating in the hall. Pt presents with R side weakness which varies with functional use vs MMT. She ambulated with hand held assist to bathroom in absence of her cane and managed pericare and hand washing without physical assist. Will follow acutely. Recommend OPOT.     If plan is discharge home, recommend the following:   A little help with walking and/or transfers;A lot of help with bathing/dressing/bathroom;Assistance with cooking/housework     Functional Status Assessment   Patient has had a recent decline in their functional status and demonstrates the ability to make significant improvements in function in a reasonable and predictable amount of time.     Equipment Recommendations   Tub/shower bench     Recommendations for Other Services         Precautions/Restrictions   Precautions Precautions: Fall     Mobility Bed Mobility Overal bed mobility: Modified Independent             General bed mobility comments: HOB up, increased effort    Transfers Overall transfer level: Needs assistance Equipment used: 1 person hand held assist Transfers: Sit  to/from Stand Sit to Stand: Contact guard assist                  Balance Overall balance assessment: Needs assistance   Sitting balance-Leahy Scale: Good       Standing balance-Leahy Scale: Fair                             ADL either performed or assessed with clinical judgement   ADL Overall ADL's : Needs assistance/impaired Eating/Feeding: Sitting;Set up;Independent   Grooming: Wash/dry hands;Supervision/safety;Standing   Upper Body Bathing: Set up;Sitting   Lower Body Bathing: Moderate assistance;Sit to/from stand   Upper Body Dressing : Set up;Sitting   Lower Body Dressing: Sitting/lateral leans;Total assistance Lower Body Dressing Details (indicate cue type and reason): assisted for socks Toilet Transfer: Minimal assistance;Ambulation Toilet Transfer Details (indicate cue type and reason): hand held assist in absence of her cane Toileting- Clothing Manipulation and Hygiene: Modified independent;Sitting/lateral lean Toileting - Clothing Manipulation Details (indicate cue type and reason): used R hand to perform pericare     Functional mobility during ADLs: Minimal assistance       Vision Baseline Vision/History: 1 Wears glasses Ability to See in Adequate Light: 1 Impaired Patient Visual Report: Blurring of vision;Other (comment) (blurring in L eye) Additional Comments: mostly wears glasses for reading     Perception Perception: Within Functional Limits       Praxis Praxis: [facility]       Pertinent Vitals/Pain Pain  Assessment Pain Assessment: Faces Faces Pain Scale: Hurts little more Pain Location: back Pain Descriptors / Indicators: Aching Pain Intervention(s): Monitored during session, Repositioned     Extremity/Trunk Assessment Upper Extremity Assessment Upper Extremity Assessment: Right hand dominant;RUE deficits/detail RUE Deficits / Details: using R UE functionally to hold phone, drink from cup, eat and wipe after toileting, MMT  3+/5   Lower Extremity Assessment Lower Extremity Assessment: Defer to PT evaluation   Cervical / Trunk Assessment Cervical / Trunk Assessment: Other exceptions (obesity, chronic back pain)   Communication Communication Communication: No apparent difficulties   Cognition Arousal: Alert Behavior During Therapy: WFL for tasks assessed/performed               OT - Cognition Comments: pt reports short term memory loss and getting lost when driving in the past                 Following commands: Intact       Cueing  General Comments   Cueing Techniques: Verbal cues      Exercises     Shoulder Instructions      Home Living Family/patient expects to be discharged to:: Private residence Living Arrangements: Spouse/significant other Available Help at Discharge: Family;Available PRN/intermittently (husband works long hours) Type of Home: Apartment Home Access: Level entry     Home Layout: One level     Bathroom Shower/Tub: Chief Strategy Officer: Handicapped height     Home Equipment: Agricultural Consultant (2 wheels);Cane - single point          Prior Functioning/Environment Prior Level of Function : Needs assist             Mobility Comments: walks with cane in the house, rarely leaves the house ADLs Comments: sponge bathes except when husband is there to help on weekends, family brings in meals and groceries and does cleaning, struggles with LB dressing, mostly wears slip on shoes    OT Problem List: Decreased strength;Impaired balance (sitting and/or standing);Decreased coordination;Decreased cognition;Pain;Obesity   OT Treatment/Interventions: Therapeutic activities;Cognitive remediation/compensation;Balance training;Patient/family education;Self-care/ADL training;Therapeutic exercise;DME and/or AE instruction      OT Goals(Current goals can be found in the care plan section)   Acute Rehab OT Goals OT Goal Formulation: With  patient Time For Goal Achievement: 08/19/24 Potential to Achieve Goals: Good ADL Goals Pt Will Perform Grooming: with modified independence;standing Pt Will Perform Lower Body Bathing: with modified independence;sit to/from stand;with adaptive equipment Pt Will Perform Lower Body Dressing: with modified independence;sit to/from stand;with adaptive equipment Pt Will Transfer to Toilet: with modified independence;ambulating;regular height toilet Pt Will Perform Toileting - Clothing Manipulation and hygiene: sit to/from stand;Independently Pt/caregiver will Perform Home Exercise Program: Increased strength;Right Upper extremity;With written HEP provided;Independently Additional ADL Goal #1: Pt will be aware of compensatory strategies for STM loss. Additional ADL Goal #2: Pt will gather items necessary for ADLs around her room mod I.   OT Frequency:  Min 2X/week    Co-evaluation              AM-PAC OT 6 Clicks Daily Activity     Outcome Measure Help from another person eating meals?: None Help from another person taking care of personal grooming?: A Little Help from another person toileting, which includes using toliet, bedpan, or urinal?: A Little Help from another person bathing (including washing, rinsing, drying)?: A Lot Help from another person to put on and taking off regular upper body clothing?: A Little Help from another person to  put on and taking off regular lower body clothing?: A Lot 6 Click Score: 17   End of Session Equipment Utilized During Treatment: Gait belt  Activity Tolerance: Patient tolerated treatment well Patient left: in bed;with call bell/phone within reach  OT Visit Diagnosis: Unsteadiness on feet (R26.81);Pain;Hemiplegia and hemiparesis;Muscle weakness (generalized) (M62.81);Other symptoms and signs involving cognitive function                Time: 8941-8862 OT Time Calculation (min): 39 min Charges:  OT General Charges $OT Visit: 1 Visit OT  Evaluation $OT Eval Moderate Complexity: 1 Mod OT Treatments $Self Care/Home Management : 23-37 mins  Kennth Mliss Helling 08/05/2024, 11:50 AM Mliss HERO, OTR/L Acute Rehabilitation Services Office: [phone]

## 2024-08-05 NOTE — ED Notes (Signed)
 Patient ambulated to restroom with assistance.

## 2024-08-05 NOTE — Care Management Obs Status (Signed)
 MEDICARE OBSERVATION STATUS NOTIFICATION   Patient Details  Name: Cathy [name] MRN: [id number] Date of Birth: 28-Oct-1960   Medicare Observation Status Notification Given:  Yes    Camelia JONETTA Cary, RN 08/05/2024, 10:55 AM
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
## 2024-08-05 NOTE — Progress Notes (Signed)
 "  HD#0 SUBJECTIVE:  Patient Summary: Cathy [name] is a 63 y.o. with a pertinent PMH of HTN, GERD, anxiety and depression, OSA, migraines with aura who presented with right sided arm and leg weakness and dysarthria and admitted for multiple acute infarcts in the right hemisphere.   Overnight Events: None  Interim History: She reports that she has improved strength of her right upper and lower extremities.  She also says that her voice has gotten better but is not back at the baseline.  She denies chest pain, shortness of breath, new weakness or sensation changes.    OBJECTIVE:  Vital Signs: Vitals:   08/04/24 2001 08/04/24 2303 08/05/24 0220 08/05/24 0400  BP: 127/78 134/64 120/75 123/86  Pulse: 74 65 60 64  Resp: 18 18 18 18   Temp: 97.9 F (36.6 C) 98.4 F (36.9 C) 98.7 F (37.1 C) 98.8 F (37.1 C)  TempSrc: Oral Oral Oral Oral  SpO2: 97% 97% 97% 100%  Weight:       Supplemental O2: Room Air SpO2: 100 %  Filed Weights   08/04/24 1049  Weight: 122.2 kg    No intake or output data in the 24 hours ending 08/05/24 0604 Net IO Since Admission: No IO data has been entered for this period [08/05/24 0604]  Physical Exam: Physical Exam Constitutional:      General: She is not in acute distress.    Appearance: She is not ill-appearing.  Eyes:     Extraocular Movements: Extraocular movements intact.  Cardiovascular:     Rate and Rhythm: Normal rate and regular rhythm.  Pulmonary:     Effort: Pulmonary effort is normal. No respiratory distress.  Musculoskeletal:     Right lower leg: No edema.     Left lower leg: No edema.  Neurological:     Mental Status: She is alert.     Comments: 4+/5 strength right lower extremity 4+/5 strength right upper extremity  Improved dysarthria      Patient Lines/Drains/Airways Status     Active Line/Drains/Airways     Name Placement date Placement time Site Days   Peripheral IV 08/04/24 20 G Anterior;Right;Upper Arm 08/04/24   1057  Arm  1            Pertinent labs and imaging:      Latest Ref Rng & Units 08/05/2024    1:40 AM 08/04/2024   11:06 AM 08/04/2024   11:00 AM  CBC  WBC 4.0 - 10.5 K/uL 6.0   6.0   Hemoglobin 12.0 - 15.0 g/dL 88.6  88.7  88.7   Hematocrit 36.0 - 46.0 % 36.1  33.0  35.6   Platelets 150 - 400 K/uL 197   203        Latest Ref Rng & Units 08/05/2024    1:40 AM 08/04/2024   11:06 AM 08/04/2024   11:00 AM  CMP  Glucose 70 - 99 mg/dL 893  85  89   BUN 8 - 23 mg/dL 16  12  13    Creatinine 0.44 - 1.00 mg/dL 8.92  8.99  9.08   Sodium 135 - 145 mmol/L 141  140  140   Potassium 3.5 - 5.1 mmol/L 3.8  3.8  4.0   Chloride 98 - 111 mmol/L 107  104  105   CO2 22 - 32 mmol/L 25   28   Calcium  8.9 - 10.3 mg/dL 9.5   9.3   Total Protein 6.5 - 8.1 g/dL  6.8   Total Bilirubin 0.0 - 1.2 mg/dL   0.4   Alkaline Phos 38 - 126 U/L   86   AST 15 - 41 U/L   23   ALT 0 - 44 U/L   20    A1C 5.9  Lipid Panel     Component Value Date/Time   CHOL 149 08/04/2024 2045   CHOL 151 04/14/2024 0844   TRIG 62 08/04/2024 2045   HDL 63 08/04/2024 2045   HDL 68 04/14/2024 0844   CHOLHDL 2.4 08/04/2024 2045   VLDL 12 08/04/2024 2045   LDLCALC 73 08/04/2024 2045   LDLCALC 71 04/14/2024 0844   LABVLDL 12 04/14/2024 0844   Blood cultures drawn and pending MR BRAIN WO CONTRAST Result Date: 08/04/2024 EXAM: MRI BRAIN WITHOUT CONTRAST 08/04/2024 01:26:20 PM TECHNIQUE: Multiplanar multisequence MRI of the head/brain was performed without the administration of intravenous contrast. COMPARISON: Same day CT head and CTA head and neck. CLINICAL HISTORY: Neuro deficit, acute, stroke suspected. FINDINGS: BRAIN AND VENTRICLES: 8 mm focus of acute infarct within the right centrum semiovale. There are additional scattered areas of acute infarct involving the cortex and subcortical white matter within the posterior right frontal lobe including portions of the lateral right precentral gyrus. T2 and FLAIR  hyperintensity in the periventricular and subcortical white matter compatible with mild chronic microvascular ischemic changes. Multiple small remote infarcts in the bilateral cerebellum. Small remote infarcts in the bilateral centrum semiovale. There is asymmetric prominence of the left Meckel's cave with a 5 mm rounded focus of soft tissue within which could reflect a schwannoma or meningioma versus other lesions. Recommend nonemergent correlation with MRI of the skull base with and without contrast. No intracranial hemorrhage. No midline shift. No hydrocephalus. The sella is unremarkable. Normal flow voids. ORBITS: No acute abnormality. SINUSES AND MASTOIDS: Retention cyst in the left maxillary sinus. BONES AND SOFT TISSUES: Normal marrow signal. No acute soft tissue abnormality. IMPRESSION: 1. Acute infarcts in the right centrum semiovale and posterior right frontal lobe, including the lateral right precentral gyrus. 2. Multiple small remote infarcts in the bilateral cerebellum and bilateral centrum semiovale. 3. Asymmetric prominence of the left Meckel cave with a 5 mm rounded focus of soft tissue, which may represent a schwannoma or meningioma versus other lesion; recommend nonemergent MRI of the skull base with and without contrast. Electronically signed by: Donnice Mania MD 08/04/2024 03:07 PM EST RP Workstation: [id number]   CT ANGIO HEAD NECK W WO CM W PERF (CODE STROKE) Result Date: 08/04/2024 EXAM: CTA Head and Neck with Perfusion 08/04/2024 10:56:20 AM TECHNIQUE: CTA of the head and neck was performed with and without the administration of 100 mL of iohexol  (OMNIPAQUE ) 350 MG/ML injection. 3D postprocessing with multiplanar reconstructions and MIPs was performed to evaluate the vascular anatomy. Cerebral perfusion analysis using computed tomography with contrast administration, including post-processing of parametric maps with determination of cerebral blood flow, cerebral blood volume, mean transit  time and time-to-maximum. Automated exposure control, iterative reconstruction, and/or weight based adjustment of the mA/kV was utilized to reduce the radiation dose to as low as reasonably achievable. COMPARISON: CT of the head dated 08/04/2024. CLINICAL HISTORY: Neuro deficit, acute, stroke suspected. FINDINGS: CTA NECK: AORTIC ARCH AND ARCH VESSELS: No dissection or arterial injury. No significant stenosis of the brachiocephalic or subclavian arteries. CERVICAL CAROTID ARTERIES: No dissection, arterial injury, or hemodynamically significant stenosis by NASCET criteria. CERVICAL VERTEBRAL ARTERIES: No dissection, arterial injury, or significant stenosis. LUNGS AND MEDIASTINUM: Unremarkable. SOFT  TISSUES: No acute abnormality. BONES: No acute abnormality. CTA HEAD: ANTERIOR CIRCULATION: No significant stenosis of the internal carotid arteries. No significant stenosis of the anterior cerebral arteries. No significant stenosis of the middle cerebral arteries. No aneurysm. POSTERIOR CIRCULATION: No significant stenosis of the posterior cerebral arteries. No significant stenosis of the basilar artery. No significant stenosis of the vertebral arteries. No aneurysm. OTHER: No dural venous sinus thrombosis on this non-dedicated study. The above findings were communicated to Doctor Michaela at 11:11 AM on 08/04/2024. CT PERFUSION: EXAM QUALITY: Exam quality is adequate with diagnostic perfusion maps. No significant motion artifact. Appropriate arterial inflow and venous outflow curves. CORE INFARCT (CBF<30% volume): 0 mL TOTAL HYPOPERFUSION (Tmax>6s volume): 0 mL PENUMBRA: Mismatch volume: 0 mL Mismatch ratio: not applicable Location: not applicable IMPRESSION: 1. No acute large vessel occlusion. Comparison is CT head dated 08/04/24. 2. No hemodynamically significant stenosis or aneurysm in the head or neck vessels. 3. No evidence of ischemia by CT brain perfusion. 4. The above findings were communicated to Dr.  Michaela at 11:11 AM on 08/04/24. Electronically signed by: Evalene Coho MD 08/04/2024 11:13 AM EST RP Workstation: [id number]   CT HEAD CODE STROKE WO CONTRAST (LKW 0-4.5h, LVO 0-24h) Result Date: 08/04/2024 EXAM: CT HEAD WITHOUT CONTRAST 08/04/2024 10:41:00 AM TECHNIQUE: CT of the head was performed without the administration of intravenous contrast. Automated exposure control, iterative reconstruction, and/or weight based adjustment of the mA/kV was utilized to reduce the radiation dose to as low as reasonably achievable. COMPARISON: CT of the head dated 09/26/2023. CLINICAL HISTORY: Neuro deficit, acute, stroke suspected. FINDINGS: BRAIN AND VENTRICLES: No acute hemorrhage. No evidence of acute infarct. No hydrocephalus. No extra-axial collection. No mass effect or midline shift. [place] Stroke Program Early CT Score (ASPECTS): Ganglionic (caudate, IC, lentiform nucleus, insula, M1-M3): 7. Supraganglionic (M4-M6): 3. Total: 10. ORBITS: No acute abnormality. SINUSES: Mild polypoid mucosal disease within the maxillary sinuses bilaterally. SOFT TISSUES AND SKULL: No acute soft tissue abnormality. No skull fracture. IMPRESSION: 1. No acute intracranial abnormality in the setting of suspected acute stroke. 2. Mild polypoid mucosal disease in the maxillary sinuses bilaterally. 3. Findings cummunicated to Dr. Michaela at 10:48 AM on 08/04/24. Aspects: 10 Electronically signed by: Evalene Coho MD 08/04/2024 10:50 AM EST RP Workstation: [id number]    ASSESSMENT/PLAN:  Assessment: Principal Problem:   Acute ischemic stroke (HCC)  Cathy [name] is a 63 y.o. person living with a history of HTN, GERD, anxiety and depression, OSA, migraines with aura who presented with right sided arm and leg weakness and dysarthria and admitted for multiple acute infarcts in the right hemisphere.   Plan: #Acute ischemic stroke  Presented with dysarthria and right-sided weakness and sensory loss along with  transient left-sided blurry vision.  Found to have multiple strokes on MRI in the right centrum semiovale and posterior right frontal lobe.  There were also small remote infarcts in the bilateral cerebellum and centrum semiovale. CT angiogram showed no large vessel occlusion.  She has had symptoms in the past that her may be concerning for strokes but have resolved on their own and could potentially be attributed to migraines with aura given their description. Her weakness has improved and she has been able to ambulate to the restroom with assistance Her deficits are not well explained by her MRI, as they are ipsilateral. A1c 5.9 lipid panel with LDL of 73. BC negative at >12 hours.   -Neurology consulted appreciate recommendations - Aspirin  81mg  daily - Plavix  75mg  daily -  Lovenox  for DVT prophylaxis - Atorvastatin  to 40 mg daily - Echocardiogram with bubble study - PT/OT/SLP consult - Check CBC tomorrow    #HTN  Home regimen chlorthalidone  25 mg daily and amlodipine  10 mg daily, she has only been taking the chlorthalidone . BP elevated at 157/123 on admission. BP 145/96 this morning, but also low at 96/40, so we will continue to hold antihypertensives. OK to restart later in the day if persistently elevated.    - Hold antihypertensives, may restart later today if persistently elevated   #HLD  LDL this admission 73. Home medication atorvastatin  20 mg.  Will increase this to high intensity dosage of 40 mg given her stroke.   -Atorvastatin  40 mg daily   #Hx of migraine with aura  The migraines that she has put her at increased risk of stroke.  She is prescribed sumatriptan  but has not taken this or hardly any other medications due to concerns that this is making her hair fall out.  She was reassured that sumatriptan  would not make her hair fall out.  Is okay for her to restart this at discharge.   #Normocytic anemia  Hgb 11.2 on admission. MCV 92.2. No significant bleeding. Stable at 11.3  this morning.   -CBC tomorrow given DAPT initiation  Best Practice: Diet: Heart healthy VTE: enoxaparin  (LOVENOX ) injection 40 mg Start: 08/04/24 2000 Code: Full  Disposition planning: Therapy Recs: Pending, Family Contact: Husband, at bedside. DISPO: Anticipated discharge today or tomorrow to Home pending PT/OT recommendations.  Signature:  Melvenia [facility]  6:04 AM, 08/05/2024  On Call pager [phone]   "
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
## 2024-08-05 NOTE — Progress Notes (Addendum)
 STROKE TEAM PROGRESS NOTE    63 y.o. 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  Site of infarct does not correspond with presented symptoms.  However, Cathy [name] [name] has multiple uncontrolled stroke risk factors and was admitted for further workup.  INTERIM HISTORY/SUBJECTIVE  On exam today, she continues to have mild right arm weakness, bilateral leg weakness which she states is chronic due to previous back surgery and lower back pain.  She has no aphasia or dysarthria, no sensation deficits. Left eye blurry vision stated.    MRI scan of Cathy [name] brain shows right frontal MCA branch infarct.  CT angiogram shows no large vessel stenosis or occlusion. Cathy [name] [name] noncompliance with her blood pressure medicine as well as her CPAP machine at home.  We discussed this extensively, providing compliance education thoroughly.  Recommend aspirin  and Plavix  for 3 weeks, then aspirin  as Cathy [name] [name] was not taking aspirin  at home prior.  Echo is pending.   OBJECTIVE  CBC    Component Value Date/Time   WBC 6.0 08/05/2024 0140   RBC 3.89 08/05/2024 0140   HGB 11.3 (L) 08/05/2024 0140   HGB 12.9 11/07/2022 1225   HCT 36.1 08/05/2024 0140   HCT 40.2 11/07/2022 1225   PLT 197 08/05/2024 0140   PLT 312 11/07/2022 1225   MCV 92.8 08/05/2024 0140   MCV 87 11/07/2022 1225   MCH 29.0 08/05/2024 0140   MCHC 31.3 08/05/2024 0140   RDW 15.5 08/05/2024 0140   RDW 14.2 11/07/2022 1225   LYMPHSABS 2.6 08/04/2024 1100   LYMPHSABS 2.9 11/07/2022 1225   MONOABS 0.5 08/04/2024 1100   EOSABS 0.0 08/04/2024 1100   EOSABS 0.0 11/07/2022 1225   BASOSABS 0.0 08/04/2024 1100   BASOSABS 0.0 11/07/2022 1225    BMET    Component Value Date/Time   NA 141 08/05/2024 0140   NA 144 04/14/2024 0844   K 3.8 08/05/2024 0140   CL 107 08/05/2024 0140   CO2 25 08/05/2024 0140   GLUCOSE  106 (H) 08/05/2024 0140   BUN 16 08/05/2024 0140   BUN 13 04/14/2024 0844   CREATININE 1.07 (H) 08/05/2024 0140   CALCIUM  9.5 08/05/2024 0140   EGFR 75 04/14/2024 0844   GFRNONAA 59 (L) 08/05/2024 0140    IMAGING past 24 hours MR BRAIN WO CONTRAST Result Date: 08/04/2024 EXAM: MRI BRAIN WITHOUT CONTRAST 08/04/2024 01:26:20 PM TECHNIQUE: Multiplanar multisequence MRI of Cathy [name] head/brain was performed without Cathy [name] administration of intravenous contrast. COMPARISON: Same day CT head and CTA head and neck. CLINICAL HISTORY: Neuro deficit, acute, stroke suspected. FINDINGS: BRAIN AND VENTRICLES: 8 mm focus of acute infarct within Cathy [name] right centrum semiovale. There are additional scattered areas of acute infarct involving Cathy [name] cortex and subcortical white matter within Cathy [name] posterior right frontal lobe including portions of Cathy [name] lateral right precentral gyrus. T2 and FLAIR hyperintensity [name] Cathy [name] periventricular and subcortical white matter compatible with mild chronic microvascular ischemic changes. Multiple small remote infarcts [name] Cathy [name] bilateral cerebellum. Small remote infarcts [name] Cathy [name] bilateral centrum semiovale. There is asymmetric prominence of Cathy [name] left Meckel's cave with a 5 mm rounded focus of soft tissue within which could reflect a schwannoma or meningioma versus other lesions. Recommend nonemergent correlation with MRI of Cathy [name] skull base with and without contrast. No intracranial hemorrhage. No midline shift. No hydrocephalus. Cathy [name] sella is unremarkable. Normal flow voids. ORBITS: No acute abnormality. SINUSES AND  MASTOIDS: Retention cyst [name] Cathy [name] left maxillary sinus. BONES AND SOFT TISSUES: Normal marrow signal. No acute soft tissue abnormality. IMPRESSION: 1. Acute infarcts [name] Cathy [name] right centrum semiovale and posterior right frontal lobe, including Cathy [name] lateral right precentral gyrus. 2. Multiple small remote infarcts [name] Cathy [name] bilateral cerebellum and bilateral centrum semiovale. 3. Asymmetric prominence of Cathy [name]  left Meckel cave with a 5 mm rounded focus of soft tissue, which may represent a schwannoma or meningioma versus other lesion; recommend nonemergent MRI of Cathy [name] skull base with and without contrast. Electronically signed by: Cathy [name] Mania MD 08/04/2024 03:07 PM EST RP Workstation: [id number]    Vitals:   08/05/24 0400 08/05/24 0832 08/05/24 0945 08/05/24 0954  BP: 123/86 (!) 96/40 (!) 145/96   Pulse: 64 85 77 70  Resp: 18 16  19   Temp: 98.8 F (37.1 C)   98.1 F (36.7 C)  TempSrc: Oral   Oral  SpO2: 100% 99% 100% 100%  Weight:         PHYSICAL EXAM General:  Alert, well-nourished, well-developed Cathy [name] Cathy [name] [name] no acute distress CV: Regular rate and rhythm on monitor Respiratory:  Regular, unlabored respirations on room air  NEURO:  Mental Status: AA&Ox3, Cathy [name] [name] to give clear and coherent history Speech/Language: No  aphasia.  Naming, repetition, fluency, and comprehension intact.  Cranial Nerves:  II: PERRL. L eye blurry vision without resulting deficit.  III, IV, VI: EOMI. Eyelids elevate symmetrically.  V: Sensation is intact to light touch and symmetrical to face.  VII: Slight right facial droop VIII: hearing intact to voice. IX, X: Palate elevates symmetrically. No  dysarthria.  KP:Dynloizm shrug 5/5. XII: tongue is midline without fasciculations. Motor:  RUE: slight drift, 4-/5 grip LUE: no drift BLE: 4-/5 with drift, chronic per Cathy [name] [name] due to prior back surgery and lower back pain Tone: is normal and bulk is normal Sensation- Intact to light touch bilaterally.  Coordination: FTN intact bilaterally.  Decreased fine motor movements right hand, ataxia to right hand.  Gait- deferred  Most Recent NIH 6.   ASSESSMENT/PLAN  Cathy [name] Cathy [name] [name] is a 63 y.o. female with history of HTN, HLD, OSA who presented 12/30 with inconsistent right sided weakness, found to have right sided infarct.  Symptoms do not coincide with infarct found on imaging.  Cathy [name] [name]  several uncontrolled stroke risk factors and she was admitted for further workup.  NIH on Admission 10  Acute Ischemic Infarct:  right hemisphere ?Acute Left hemispheric infarct not seen on imaging Etiology:  Embolic [name] Cathy [name] [name] with multiple uncontrolled stroke risk factors.   Code Stroke CT head   No acute intracranial abnormality [name] Cathy [name] setting of suspected acute stroke. Mild polypoid mucosal disease [name] Cathy [name] maxillary sinuses bilaterally. CTA head & neck with perfusion No acute large vessel occlusion. Comparison is CT head dated 08/04/24. No hemodynamically significant stenosis or aneurysm [name] Cathy [name] head or neck vessels. No evidence of ischemia by CT brain perfusion MRI    Acute infarcts [name] Cathy [name] right centrum semiovale and posterior right frontal lobe, including Cathy [name] lateral right precentral gyrus. Multiple small remote infarcts [name] Cathy [name] bilateral cerebellum and bilateral centrum semiovale. Asymmetric prominence of Cathy [name] left Meckel cave with a 5 mm rounded focus of soft tissue, which may represent a schwannoma or meningioma versus other lesion; recommend nonemergent MRI of Cathy [name] skull base with and without contrast. 2D Echo PENDING Recommend TEE with CAD history and evidence of stroke.  Recommend 30day heart monitor on discharge LDL 73 HgbA1c  5.9 VTE prophylaxis - lovenox  No antithrombotic prior to admission, now on aspirin  81 mg daily and clopidogrel  75 mg daily for 3 weeks and then Aspirin  alone. Aspirin  is listed on Cathy [name] [name]'s home medications. However, she states she was not taking it and hadn't been for a while. She was never prescribed it, just started taking it a while ago and then stopped.  Therapy recommendations:  Pending Disposition:  pending  Hypertension Home meds:  amlodipine  10mg  daily Cathy [name] [name] endorses non-compliance, medication compliance education given Stable Blood Pressure Goal: BP less than 220/110 permissive hypertension for first 24 hours after symptom onset, then gradually  normalize  Hyperlipidemia Home meds:  Lipitor 20mg  LDL 73, goal < 70 Increase to 40mg  Continue statin at discharge  Pre-Diabetes Home meds:  none HgbA1c 5.9, goal < 7.0 Recommend close follow-up with PCP   Tobacco Abuse Former Cigarette Smoker  Other Stroke Risk Factors Obesity, Body mass index is 46.24 kg/m., BMI >/= 30 associated with increased stroke risk, recommend weight loss, diet and exercise as appropriate. Family hx stroke (mother) Coronary artery disease L heart cath 2018 Obstructive sleep apnea, on CPAP at home Endorses non-compliance with mask, compliance education given Migraines Home meds: Imitrex  and Topamax   Hospital day # 0   Pt seen by Neuro NP/APP with MD. Note/plan to be edited by MD as needed.    Rocky JAYSON Likes, DNP [facility] Please use AMION for contact information & EPIC for messaging.  I have personally obtained history,examined this Cathy [name] [name], reviewed notes, independently viewed imaging studies, participated [name] medical decision making and plan of care.ROS completed by me personally and pertinent positives fully documented  I have made any additions or clarifications directly to Cathy [name] above note. Agree with note above.  Cathy [name] [name] presented with right frontal embolic MCA branch infarct with left sided blurred vision and right sided weakness but MRI does not show definite left brain stroke.  Etiology likely embolic of cryptogenic source.  Recommend continue ongoing workup for checking telemetry monitoring, TEE and prolonged cardiac monitoring at discharge for paroxysmal A-fib.  Recommend aspirin  and Plavix  for 3 weeks followed by aspirin  alone and aggressive risk factor modification.  Cathy [name] [name] counseled to be compliant with using her CPAP every night for sleep apnea   I personally spent a total of 50 minutes [name] Cathy [name] care of Cathy [name] Cathy [name] [name] today including getting/reviewing separately obtained history, performing a medically appropriate exam/evaluation,  counseling and educating, placing orders, referring and communicating with other health care professionals, documenting clinical information [name] Cathy [name] EHR, independently interpreting results, and coordinating care.        Eather Popp, MD Medical Director [facility] Pager: [phone] 08/05/2024 3:15 PM  To contact Stroke Continuity provider, please refer to [email or web address]. After hours, contact General Neurology
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
## 2024-08-06 ENCOUNTER — Other Ambulatory Visit: Payer: Self-pay | Admitting: Cardiology

## 2024-08-06 DIAGNOSIS — I639 Cerebral infarction, unspecified: Secondary | ICD-10-CM

## 2024-08-06 DIAGNOSIS — I10 Essential (primary) hypertension: Secondary | ICD-10-CM | POA: Diagnosis not present

## 2024-08-06 DIAGNOSIS — E785 Hyperlipidemia, unspecified: Secondary | ICD-10-CM | POA: Diagnosis not present

## 2024-08-06 DIAGNOSIS — I63411 Cerebral infarction due to embolism of right middle cerebral artery: Secondary | ICD-10-CM | POA: Diagnosis not present

## 2024-08-06 DIAGNOSIS — Z91148 Patient's other noncompliance with medication regimen for other reason: Secondary | ICD-10-CM

## 2024-08-06 DIAGNOSIS — D649 Anemia, unspecified: Secondary | ICD-10-CM

## 2024-08-06 DIAGNOSIS — R7303 Prediabetes: Secondary | ICD-10-CM | POA: Diagnosis not present

## 2024-08-06 LAB — BASIC METABOLIC PANEL WITH GFR
Anion gap: 9 (ref 5–15)
BUN: 18 mg/dL (ref 8–23)
CO2: 27 mmol/L (ref 22–32)
Calcium: 9.3 mg/dL (ref 8.9–10.3)
Chloride: 105 mmol/L (ref 98–111)
Creatinine, Ser: 1.02 mg/dL — ABNORMAL HIGH (ref 0.44–1.00)
GFR, Estimated: >60 mL/min
Glucose, Bld: 107 mg/dL — ABNORMAL HIGH (ref 70–99)
Potassium: 3.6 mmol/L (ref 3.5–5.1)
Sodium: 140 mmol/L (ref 135–145)

## 2024-08-06 LAB — CBC
HCT: 34.1 % — ABNORMAL LOW (ref 36.0–46.0)
Hemoglobin: 10.9 g/dL — ABNORMAL LOW (ref 12.0–15.0)
MCH: 28.5 pg (ref 26.0–34.0)
MCHC: 32 g/dL (ref 30.0–36.0)
MCV: 89.3 fL (ref 80.0–100.0)
Platelets: 214 K/uL (ref 150–400)
RBC: 3.82 MIL/uL — ABNORMAL LOW (ref 3.87–5.11)
RDW: 15.3 % (ref 11.5–15.5)
WBC: 6 K/uL (ref 4.0–10.5)
nRBC: 0 % (ref 0.0–0.2)

## 2024-08-06 MED ORDER — POLYETHYLENE GLYCOL 3350 17 G PO PACK
17.0000 g | PACK | Freq: Every day | ORAL | Status: DC
  Administered 2024-08-06: 17 g via ORAL
  Filled 2024-08-06: qty 1

## 2024-08-06 MED ORDER — SODIUM CHLORIDE 0.9 % IV SOLN: INTRAVENOUS | Status: DC

## 2024-08-06 NOTE — Progress Notes (Signed)
" ° °  [facility] has been requested to perform a transesophageal echocardiogram on Cathy [name] for stroke.     The patient does NOT have any absolute or relative contraindications to a Transesophageal Echocardiogram (TEE).  The patient has: No other conditions that may impact this procedure.    After careful review of history and examination, the risks and benefits of transesophageal echocardiogram have been explained including risks of esophageal damage, perforation (1:10,000 risk), bleeding, pharyngeal hematoma as well as other potential complications associated with conscious sedation including aspiration, arrhythmia, respiratory failure and death. Alternatives to treatment were discussed, questions were answered. Patient is willing to proceed.   Have sent a message to hopefully get her on the schedule for 1/2  Signed, Manuelita Rummer, NP  08/06/2024 8:08 AM   "

## 2024-08-06 NOTE — Progress Notes (Signed)
 "  HD#1 SUBJECTIVE:  Patient Summary: Cathy [name] is a 64 y.o. with a pertinent PMH of HTN, GERD, anxiety and depression, OSA, migraines with aura who presented with right sided arm and leg weakness and dysarthria and admitted for multiple acute infarcts in the right hemisphere.   Overnight Events: None  Interim History: Continues to report improvement in the strength of her right upper and lower extremities. Denies new concerns.   OBJECTIVE:  Vital Signs: Vitals:   08/05/24 1600 08/05/24 1649 08/05/24 2000 08/06/24 0400  BP: (!) 159/105 (!) 158/91 138/75 135/74  Pulse: 83 73 63   Resp: 16 19    Temp:  98.3 F (36.8 C) 98.1 F (36.7 C) 98.2 F (36.8 C)  TempSrc:  Oral Oral Oral  SpO2: 100% 98% 98% 97%  Weight:       Supplemental O2: Room Air SpO2: 97 %  Filed Weights   08/04/24 1049  Weight: 122.2 kg    No intake or output data in the 24 hours ending 08/06/24 9278 Net IO Since Admission: No IO data has been entered for this period [08/06/24 0721]  Physical Exam: Physical Exam Constitutional:      General: She is not in acute distress.    Appearance: She is not ill-appearing.  Eyes:     Extraocular Movements: Extraocular movements intact.  Cardiovascular:     Rate and Rhythm: Normal rate and regular rhythm.  Pulmonary:     Effort: Pulmonary effort is normal. No respiratory distress.  Musculoskeletal:     Right lower leg: No edema.     Left lower leg: No edema.  Neurological:     Mental Status: She is alert.     Comments: 4+/5 strength right lower extremity 4+/5 strength right upper extremity  Improved dysarthria      Patient Lines/Drains/Airways Status     Active Line/Drains/Airways     Name Placement date Placement time Site Days   Peripheral IV 08/04/24 20 G Anterior;Right;Upper Arm 08/04/24  1057  Arm  2            Pertinent labs and imaging:      Latest Ref Rng & Units 08/06/2024    1:41 AM 08/05/2024    1:40 AM 08/04/2024   11:06 AM   CBC  WBC 4.0 - 10.5 K/uL 6.0  6.0    Hemoglobin 12.0 - 15.0 g/dL 89.0  88.6  88.7   Hematocrit 36.0 - 46.0 % 34.1  36.1  33.0   Platelets 150 - 400 K/uL 214  197         Latest Ref Rng & Units 08/06/2024    1:41 AM 08/05/2024    1:40 AM 08/04/2024   11:06 AM  CMP  Glucose 70 - 99 mg/dL 892  893  85   BUN 8 - 23 mg/dL 18  16  12    Creatinine 0.44 - 1.00 mg/dL 8.97  8.92  8.99   Sodium 135 - 145 mmol/L 140  141  140   Potassium 3.5 - 5.1 mmol/L 3.6  3.8  3.8   Chloride 98 - 111 mmol/L 105  107  104   CO2 22 - 32 mmol/L 27  25    Calcium  8.9 - 10.3 mg/dL 9.3  9.5     J8R 5.9  Lipid Panel     Component Value Date/Time   CHOL 149 08/04/2024 2045   CHOL 151 04/14/2024 0844   TRIG 62 08/04/2024 2045   HDL 63  08/04/2024 2045   HDL 68 04/14/2024 0844   CHOLHDL 2.4 08/04/2024 2045   VLDL 12 08/04/2024 2045   LDLCALC 73 08/04/2024 2045   LDLCALC 71 04/14/2024 0844   LABVLDL 12 04/14/2024 0844   Blood cultures negative to date ECHOCARDIOGRAM COMPLETE Result Date: 08/05/2024    ECHOCARDIOGRAM REPORT   Patient Name:   Cathy [name] Date of Exam: 08/05/2024 Medical Rec #:  [id number]      Height:       64.0 in Accession #:    [id number]     Weight:       269.4 lb Date of Birth:  23-Dec-1960       BSA:          2.220 m Patient Age:    63 years       BP:           145/96 mmHg Patient Gender: F              HR:           64 bpm. Exam Location:  Inpatient Procedure: 2D Echo, Cardiac Doppler and Color Doppler (Both Spectral and Color            Flow Doppler were utilized during procedure). Indications:    Stroke I63.9  History:        Patient has prior history of Echocardiogram examinations, most                 recent 05/06/2017. CAD; Risk Factors:Hypertension and Sleep                 Apnea.  Sonographer:    Jayson Gaskins Referring Phys: [id number] JEFFREY C HATCHER IMPRESSIONS  1. Left ventricular ejection fraction, by estimation, is 55 to 60%. The left ventricle has normal function. The left ventricle  has no regional wall motion abnormalities. There is mild left ventricular hypertrophy of the basal-septal segment. Left ventricular diastolic parameters were normal.  2. Right ventricular systolic function is normal. The right ventricular size is normal.  3. The mitral valve is normal in structure. Trivial mitral valve regurgitation. No evidence of mitral stenosis.  4. The aortic valve is tricuspid. Aortic valve regurgitation is not visualized. No aortic stenosis is present. FINDINGS  Left Ventricle: Left ventricular ejection fraction, by estimation, is 55 to 60%. The left ventricle has normal function. The left ventricle has no regional wall motion abnormalities. The left ventricular internal cavity size was normal in size. There is  mild left ventricular hypertrophy of the basal-septal segment. Left ventricular diastolic parameters were normal. Right Ventricle: The right ventricular size is normal. Right ventricular systolic function is normal. Left Atrium: Left atrial size was normal in size. Right Atrium: Right atrial size was normal in size. Pericardium: There is no evidence of pericardial effusion. Mitral Valve: The mitral valve is normal in structure. Trivial mitral valve regurgitation. No evidence of mitral valve stenosis. Tricuspid Valve: The tricuspid valve is normal in structure. Tricuspid valve regurgitation is not demonstrated. No evidence of tricuspid stenosis. Aortic Valve: The aortic valve is tricuspid. Aortic valve regurgitation is not visualized. No aortic stenosis is present. Aortic valve mean gradient measures 4.0 mmHg. Aortic valve peak gradient measures 6.2 mmHg. Aortic valve area, by VTI measures 2.88 cm. Pulmonic Valve: The pulmonic valve was not well visualized. Pulmonic valve regurgitation is not visualized. No evidence of pulmonic stenosis. Aorta: The aortic root is normal in size and structure. Venous: The inferior vena cava was not well  visualized. IAS/Shunts: The interatrial septum was  not well visualized.  LEFT VENTRICLE PLAX 2D LVIDd:         4.00 cm   Diastology LVIDs:         2.90 cm   LV e' medial:    9.14 cm/s LV PW:         1.00 cm   LV E/e' medial:  9.2 LV IVS:        1.40 cm   LV e' lateral:   12.10 cm/s LVOT diam:     1.89 cm   LV E/e' lateral: 6.9 LV SV:         65 LV SV Index:   29 LVOT Area:     2.81 cm  RIGHT VENTRICLE RV S prime:     17.00 cm/s TAPSE (M-mode): 2.1 cm LEFT ATRIUM             Index LA Vol (A2C):   50.0 ml 22.52 ml/m LA Vol (A4C):   49.5 ml 22.29 ml/m LA Biplane Vol: 53.4 ml 24.05 ml/m  AORTIC VALVE AV Area (Vmax):    2.42 cm AV Area (Vmean):   2.52 cm AV Area (VTI):     2.88 cm AV Vmax:           124.00 cm/s AV Vmean:          92.200 cm/s AV VTI:            0.226 m AV Peak Grad:      6.2 mmHg AV Mean Grad:      4.0 mmHg LVOT Vmax:         107.00 cm/s LVOT Vmean:        82.900 cm/s LVOT VTI:          0.232 m LVOT/AV VTI ratio: 1.03  AORTA Ao Root diam: 3.33 cm MITRAL VALVE MV Area (PHT): 2.31 cm     SHUNTS MV Decel Time: 328 msec     Systemic VTI:  0.23 m MV E velocity: 83.80 cm/s   Systemic Diam: 1.89 cm MV A velocity: 108.00 cm/s MV E/A ratio:  0.78 Redell Shallow MD Electronically signed by Redell Shallow MD Signature Date/Time: 08/05/2024/12:23:37 PM    Final     ASSESSMENT/PLAN:  Assessment: Principal Problem:   Acute ischemic stroke (HCC)  Cathy [name] is a 63 y.o. person living with a history of HTN, GERD, anxiety and depression, OSA, migraines with aura who presented with right sided arm and leg weakness and dysarthria and admitted for multiple acute infarcts in the right hemisphere.   Plan: #Acute ischemic stroke  Presented with dysarthria and right-sided weakness and sensory loss along with transient left-sided blurry vision.  Found to have multiple strokes on MRI in the right centrum semiovale and posterior right frontal lobe.  There were also small remote infarcts in the bilateral cerebellum and centrum semiovale. CT angiogram showed  no large vessel occlusion.  She has had symptoms in the past that her may be concerning for strokes but have resolved on their own and could potentially be attributed to migraines with aura given their description. Her weakness has improved and she has been able to ambulate to the restroom with assistance. Her deficits are not well explained by her MRI, as they are ipsilateral. A1c 5.9 lipid panel with LDL of 73. BC negative at 2 days. PT/OT recommending HHPT and HHOT   -Neurology consulted appreciate recommendations -TEE, cardiology consulted and will try to schedule tomorrow  on 1/2 -Holter monitor on discharge - Aspirin  81mg  daily indefinitely - Plavix  75mg  daily for 3 weeks - Lovenox  for DVT prophylaxis - Atorvastatin  to 40 mg daily - Echocardiogram with bubble study    #HTN  Home regimen chlorthalidone  25 mg daily and amlodipine  10 mg daily, she has only been taking the chlorthalidone . BP elevated at 157/123 on admission. BP 131/77 this morning, so we will continue to hold antihypertensives. OK to restart later in the day if persistently elevated.    - Hold antihypertensives, may restart later today if persistently elevated   #HLD  LDL this admission 73. Home medication atorvastatin  20 mg.  Will increase this to high intensity dosage of 40 mg given her stroke.   -Atorvastatin  40 mg daily   #Hx of migraine with aura  The migraines that she has put her at increased risk of stroke.  She is prescribed sumatriptan  but has not taken this or hardly any other medications due to concerns that this is making her hair fall out.  She was reassured that sumatriptan  would not make her hair fall out.  Is okay for her to restart this at discharge.   #Normocytic anemia  Hgb 11.2 on admission. MCV 92.2. No significant bleeding. Stable at 10.9 this morning.   -CBC tomorrow given DAPT initiation  Best Practice: Diet: Heart healthy VTE: enoxaparin  (LOVENOX ) injection 40 mg Start: 08/04/24 2000 Code:  Full  Disposition planning: Therapy Recs: Pending, Family Contact: Husband, at bedside. DISPO: Anticipated discharge tomorrow after TEE  Signature:  [name]  7:21 AM, 08/06/2024  On Call pager [phone]   "

## 2024-08-06 NOTE — Progress Notes (Signed)
 STROKE TEAM PROGRESS NOTE    64 y.o. 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  Site of infarct does not correspond with presented symptoms.  However, patient has multiple uncontrolled stroke risk factors and was admitted for further workup.  INTERIM HISTORY/SUBJECTIVE Patient is sitting up in bed.  She states sleeping better.  Left-sided vision and right-sided strength appear to be improving today. On exam today, she continues to have mild right arm weakness, bilateral leg weakness which she states is chronic due to previous back surgery and lower back pain.  She has no aphasia or dysarthria, no sensation deficits. Left eye blurry vision is improving       Echo shows EF of 55 to 60% left size is normal.  OBJECTIVE  CBC    Component Value Date/Time   WBC 6.0 08/06/2024 0141   RBC 3.82 (L) 08/06/2024 0141   HGB 10.9 (L) 08/06/2024 0141   HGB 12.9 11/07/2022 1225   HCT 34.1 (L) 08/06/2024 0141   HCT 40.2 11/07/2022 1225   PLT 214 08/06/2024 0141   PLT 312 11/07/2022 1225   MCV 89.3 08/06/2024 0141   MCV 87 11/07/2022 1225   MCH 28.5 08/06/2024 0141   MCHC 32.0 08/06/2024 0141   RDW 15.3 08/06/2024 0141   RDW 14.2 11/07/2022 1225   LYMPHSABS 2.6 08/04/2024 1100   LYMPHSABS 2.9 11/07/2022 1225   MONOABS 0.5 08/04/2024 1100   EOSABS 0.0 08/04/2024 1100   EOSABS 0.0 11/07/2022 1225   BASOSABS 0.0 08/04/2024 1100   BASOSABS 0.0 11/07/2022 1225    BMET    Component Value Date/Time   NA 140 08/06/2024 0141   NA 144 04/14/2024 0844   K 3.6 08/06/2024 0141   CL 105 08/06/2024 0141   CO2 27 08/06/2024 0141   GLUCOSE 107 (H) 08/06/2024 0141   BUN 18 08/06/2024 0141   BUN 13 04/14/2024 0844   CREATININE 1.02 (H) 08/06/2024 0141   CALCIUM  9.3 08/06/2024 0141   EGFR 75 04/14/2024 0844   GFRNONAA >60 08/06/2024 0141    IMAGING past 24  hours ECHOCARDIOGRAM COMPLETE Result Date: 08/05/2024    ECHOCARDIOGRAM REPORT   Patient Name:   TENNA LACKO Date of Exam: 08/05/2024 Medical Rec #:  [id number]      Height:       64.0 in Accession #:    [id number]     Weight:       269.4 lb Date of Birth:  1961-06-28       BSA:          2.220 m Patient Age:    63 years       BP:           145/96 mmHg Patient Gender: F              HR:           64 bpm. Exam Location:  Inpatient Procedure: 2D Echo, Cardiac Doppler and Color Doppler (Both Spectral and Color            Flow Doppler were utilized during procedure). Indications:    Stroke I63.9  History:        Patient has prior history of Echocardiogram examinations, most                 recent 05/06/2017. CAD; Risk Factors:Hypertension and Sleep  Apnea.  Sonographer:    Jayson Gaskins Referring Phys: [id number] JEFFREY C HATCHER IMPRESSIONS  1. Left ventricular ejection fraction, by estimation, is 55 to 60%. The left ventricle has normal function. The left ventricle has no regional wall motion abnormalities. There is mild left ventricular hypertrophy of the basal-septal segment. Left ventricular diastolic parameters were normal.  2. Right ventricular systolic function is normal. The right ventricular size is normal.  3. The mitral valve is normal in structure. Trivial mitral valve regurgitation. No evidence of mitral stenosis.  4. The aortic valve is tricuspid. Aortic valve regurgitation is not visualized. No aortic stenosis is present. FINDINGS  Left Ventricle: Left ventricular ejection fraction, by estimation, is 55 to 60%. The left ventricle has normal function. The left ventricle has no regional wall motion abnormalities. The left ventricular internal cavity size was normal in size. There is  mild left ventricular hypertrophy of the basal-septal segment. Left ventricular diastolic parameters were normal. Right Ventricle: The right ventricular size is normal. Right ventricular systolic function is normal.  Left Atrium: Left atrial size was normal in size. Right Atrium: Right atrial size was normal in size. Pericardium: There is no evidence of pericardial effusion. Mitral Valve: The mitral valve is normal in structure. Trivial mitral valve regurgitation. No evidence of mitral valve stenosis. Tricuspid Valve: The tricuspid valve is normal in structure. Tricuspid valve regurgitation is not demonstrated. No evidence of tricuspid stenosis. Aortic Valve: The aortic valve is tricuspid. Aortic valve regurgitation is not visualized. No aortic stenosis is present. Aortic valve mean gradient measures 4.0 mmHg. Aortic valve peak gradient measures 6.2 mmHg. Aortic valve area, by VTI measures 2.88 cm. Pulmonic Valve: The pulmonic valve was not well visualized. Pulmonic valve regurgitation is not visualized. No evidence of pulmonic stenosis. Aorta: The aortic root is normal in size and structure. Venous: The inferior vena cava was not well visualized. IAS/Shunts: The interatrial septum was not well visualized.  LEFT VENTRICLE PLAX 2D LVIDd:         4.00 cm   Diastology LVIDs:         2.90 cm   LV e' medial:    9.14 cm/s LV PW:         1.00 cm   LV E/e' medial:  9.2 LV IVS:        1.40 cm   LV e' lateral:   12.10 cm/s LVOT diam:     1.89 cm   LV E/e' lateral: 6.9 LV SV:         65 LV SV Index:   29 LVOT Area:     2.81 cm  RIGHT VENTRICLE RV S prime:     17.00 cm/s TAPSE (M-mode): 2.1 cm LEFT ATRIUM             Index LA Vol (A2C):   50.0 ml 22.52 ml/m LA Vol (A4C):   49.5 ml 22.29 ml/m LA Biplane Vol: 53.4 ml 24.05 ml/m  AORTIC VALVE AV Area (Vmax):    2.42 cm AV Area (Vmean):   2.52 cm AV Area (VTI):     2.88 cm AV Vmax:           124.00 cm/s AV Vmean:          92.200 cm/s AV VTI:            0.226 m AV Peak Grad:      6.2 mmHg AV Mean Grad:      4.0 mmHg LVOT Vmax:  107.00 cm/s LVOT Vmean:        82.900 cm/s LVOT VTI:          0.232 m LVOT/AV VTI ratio: 1.03  AORTA Ao Root diam: 3.33 cm MITRAL VALVE MV Area (PHT): 2.31  cm     SHUNTS MV Decel Time: 328 msec     Systemic VTI:  0.23 m MV E velocity: 83.80 cm/s   Systemic Diam: 1.89 cm MV A velocity: 108.00 cm/s MV E/A ratio:  0.78 Redell Shallow MD Electronically signed by Redell Shallow MD Signature Date/Time: 08/05/2024/12:23:37 PM    Final     Vitals:   08/05/24 1649 08/05/24 2000 08/06/24 0400 08/06/24 0817  BP: (!) 158/91 138/75 135/74 131/77  Pulse: 73 63  70  Resp: 19   18  Temp: 98.3 F (36.8 C) 98.1 F (36.7 C) 98.2 F (36.8 C) 98 F (36.7 C)  TempSrc: Oral Oral Oral Oral  SpO2: 98% 98% 97% 97%  Weight:         PHYSICAL EXAM General:  Alert, well-nourished, well-developed patient in no acute distress CV: Regular rate and rhythm on monitor Respiratory:  Regular, unlabored respirations on room air  NEURO:  Mental Status: AA&Ox3, patient is able to give clear and coherent history Speech/Language: No  aphasia.  Naming, repetition, fluency, and comprehension intact.  Cranial Nerves:  II: PERRL. L eye blurry vision without resulting deficit.  III, IV, VI: EOMI. Eyelids elevate symmetrically.  V: Sensation is intact to light touch and symmetrical to face.  VII: Slight right facial droop VIII: hearing intact to voice. IX, X: Palate elevates symmetrically. No  dysarthria.  KP:Dynloizm shrug 5/5. XII: tongue is midline without fasciculations. Motor:  RUE: slight drift, 4-/5 grip LUE: no drift BLE: 4-/5 with drift, chronic per patient due to prior back surgery and lower back pain Tone: is normal and bulk is normal Sensation- Intact to light touch bilaterally.  Coordination: FTN intact bilaterally.  Decreased fine motor movements right hand, ataxia to right hand.  Gait- deferred      ASSESSMENT/PLAN  Ms. SHATYRA BECKA is a 64 y.o. female with history of HTN, HLD, OSA who presented 12/30 with inconsistent right sided weakness, found to have right sided infarct.  Symptoms do not coincide with infarct found on imaging.  Patient does  have several uncontrolled stroke risk factors and she was admitted for further workup.  NIH on Admission 10  Acute Ischemic Infarct:  right hemisphere ?Acute Left hemispheric infarct not seen on imaging Etiology:  Embolic in patient with multiple uncontrolled stroke risk factors.   Code Stroke CT head   No acute intracranial abnormality in the setting of suspected acute stroke. Mild polypoid mucosal disease in the maxillary sinuses bilaterally. CTA head & neck with perfusion No acute large vessel occlusion. Comparison is CT head dated 08/04/24. No hemodynamically significant stenosis or aneurysm in the head or neck vessels. No evidence of ischemia by CT brain perfusion MRI    Acute infarcts in the right centrum semiovale and posterior right frontal lobe, including the lateral right precentral gyrus. Multiple small remote infarcts in the bilateral cerebellum and bilateral centrum semiovale. Asymmetric prominence of the left Meckel cave with a 5 mm rounded focus of soft tissue, which may represent a schwannoma or meningioma versus other lesion; recommend nonemergent MRI of the skull base with and without contrast. 2D Echo EF 55 to 60%.  Left atrial size is normal. Recommend 30day heart monitor on discharge LDL 73 HgbA1c  5.9 VTE prophylaxis - lovenox  No antithrombotic prior to admission, now on aspirin  81 mg daily and clopidogrel  75 mg daily for 3 weeks and then Aspirin  alone. Aspirin  is listed on patient's home medications. However, she states she was not taking it and hadn't been for a while. She was never prescribed it, just started taking it a while ago and then stopped.  Therapy recommendations:  Pending Disposition:  pending  Hypertension Home meds:  amlodipine  10mg  daily Patient endorses non-compliance, medication compliance education given Stable Blood Pressure Goal: BP less than 220/110 permissive hypertension for first 24 hours after symptom onset, then gradually  normalize  Hyperlipidemia Home meds:  Lipitor 20mg  LDL 73, goal < 70 Increase to 40mg  Continue statin at discharge  Pre-Diabetes Home meds:  none HgbA1c 5.9, goal < 7.0 Recommend close follow-up with PCP   Tobacco Abuse Former Cigarette Smoker  Other Stroke Risk Factors Obesity, Body mass index is 46.24 kg/m., BMI >/= 30 associated with increased stroke risk, recommend weight loss, diet and exercise as appropriate. Family hx stroke (mother) Coronary artery disease L heart cath 2018 Obstructive sleep apnea, on CPAP at home Endorses non-compliance with mask, compliance education given Migraines Home meds: Imitrex  and Topamax     Patient presented with right frontal embolic MCA branch infarct with left sided blurred vision and right sided weakness but MRI does not show definite left brain stroke.  Etiology likely embolic of cryptogenic source.  Recommend continue ongoing workup for checking telemetry monitoring,  and prolonged cardiac monitoring at discharge for paroxysmal A-fib.  Recommend aspirin  and Plavix  for 3 weeks followed by aspirin  alone and aggressive risk factor modification.  Patient counseled to be compliant with using her CPAP every night for sleep apnea. Stroke team will sign off.  Kindly call for questions      Eather Popp, MD Medical Director [facility] Pager: [phone] 08/06/2024 10:08 AM  To contact Stroke Continuity provider, please refer to [email or web address]. After hours, contact General Neurology

## 2024-08-06 NOTE — Progress Notes (Signed)
 30d monitor ordered for Stroke. DOD Dr. Kriste to read. Follow up arranged in the office.

## 2024-08-06 NOTE — TOC Initial Note (Signed)
 Transition of Care [facility]) - Initial/Assessment Note    Patient Details  Name: Cathy [name] MRN: [id number] Date of Birth: 01-13-1961  Transition of Care [facility]) CM/SW Contact:    Roxie KANDICE Stain, RN Phone Number: 08/06/2024, 3:01 PM  Clinical Narrative:                 Spoke to patient regarding transition needs.  Patient lives with spouse who works until 66. Patient has family support.  Patient has walker, cane, BSC.  Patient requesting outpatient rehab [address]. Referral sent. Order for tub bench referral sent to Jermaine with [employer]. Patient's granddaughter can provide transportation. PCP confirmed.  Expected Discharge Plan: OP Rehab Barriers to Discharge: Continued Medical Work up   Patient Goals and CMS Choice Patient states their goals for this hospitalization and ongoing recovery are:: return home CMS Medicare.gov Compare Post Acute Care list provided to:: Patient Choice offered to / list presented to : Patient      Expected Discharge Plan and Services   Discharge Planning Services: CM Consult Post Acute Care Choice: Durable Medical Equipment Living arrangements for the past 2 months: Apartment                 DME Arranged: Tub bench DME Agency: [employer] Date DME Agency Contacted: 08/06/24 Time DME Agency Contacted: 1501 Representative spoke with at DME Agency: [place]            Prior Living Arrangements/Services Living arrangements for the past 2 months: Apartment Lives with:: Spouse Patient language and need for interpreter reviewed:: Yes Do you feel safe going back to the place where you live?: Yes      Need for Family Participation in Patient Care: Yes (Comment) Care giver support system in place?: Yes (comment) Current home services: DME (walker, cane, BSC) Criminal Activity/Legal Involvement Pertinent to Current Situation/Hospitalization: No - Comment as needed  Activities of Daily Living   ADL Screening (condition at  time of admission) Independently performs ADLs?: Yes (appropriate for developmental age) Is the patient deaf or have difficulty hearing?: No Does the patient have difficulty seeing, even when wearing glasses/contacts?: No Does the patient have difficulty concentrating, remembering, or making decisions?: No  Permission Sought/Granted         Permission granted to share info w AGENCY: OP rehab        Emotional Assessment Appearance:: Appears stated age Attitude/Demeanor/Rapport: Gracious, Engaged Affect (typically observed): Accepting Orientation: : Oriented to Self, Oriented to Place, Oriented to  Time, Oriented to Situation Alcohol / Substance Use: Not Applicable Psych Involvement: No (comment)  Admission diagnosis:  Acute ischemic stroke [facility]) [I63.9] Patient Active Problem List   Diagnosis Date Noted   Acute ischemic stroke (HCC) 08/04/2024   Gastroesophageal reflux 01/19/2021   Asthma, moderate persistent 05/07/2018   Obesity, Class III, BMI 40-49.9 (morbid obesity) (HCC) 05/07/2018   Anxiety and depression 10/09/2017   Abnormal stress test, catheterization done after that showed normal coronaries 05/17/2017   Obstructive sleep apnea 04/18/2017   Hypertension 04/08/2015   Right shoulder pain 04/08/2015   Disorder of kidney and ureter 03/14/2009   PCP:  Delbert Clam, MD Pharmacy:   [facility] [address]), [place] - [address] [place] ([place]) [place] [address] Phone: [phone] Fax: [phone]     Social Drivers of Health (SDOH) Social History: SDOH Screenings   Food Insecurity: No Food Insecurity (08/05/2024)  Housing: Low Risk (08/05/2024)  Transportation Needs: No Transportation  Needs (08/05/2024)  Utilities: Not At Risk (08/05/2024)  Alcohol Screen: Low Risk (02/18/2024)  Depression (PHQ2-9): High Risk (04/13/2024)  Financial Resource Strain: Low Risk (02/18/2024)  Physical Activity: Insufficiently Active  (02/18/2024)  Social Connections: Unknown (08/05/2024)  Stress: Stress Concern Present (02/18/2024)  Tobacco Use: Medium Risk (08/04/2024)  Health Literacy: Adequate Health Literacy (09/03/2023)   SDOH Interventions:     Readmission Risk Interventions     No data to display

## 2024-08-06 NOTE — H&P (View-Only) (Signed)
 STROKE TEAM PROGRESS NOTE    64 y.o. 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  Site of infarct does not correspond with presented symptoms.  However, patient has multiple uncontrolled stroke risk factors and was admitted for further workup.  INTERIM HISTORY/SUBJECTIVE Patient is sitting up in bed.  She states sleeping better.  Left-sided vision and right-sided strength appear to be improving today. On exam today, she continues to have mild right arm weakness, bilateral leg weakness which she states is chronic due to previous back surgery and lower back pain.  She has no aphasia or dysarthria, no sensation deficits. Left eye blurry vision is improving       Echo shows EF of 55 to 60% left size is normal.  OBJECTIVE  CBC    Component Value Date/Time   WBC 6.0 08/06/2024 0141   RBC 3.82 (L) 08/06/2024 0141   HGB 10.9 (L) 08/06/2024 0141   HGB 12.9 11/07/2022 1225   HCT 34.1 (L) 08/06/2024 0141   HCT 40.2 11/07/2022 1225   PLT 214 08/06/2024 0141   PLT 312 11/07/2022 1225   MCV 89.3 08/06/2024 0141   MCV 87 11/07/2022 1225   MCH 28.5 08/06/2024 0141   MCHC 32.0 08/06/2024 0141   RDW 15.3 08/06/2024 0141   RDW 14.2 11/07/2022 1225   LYMPHSABS 2.6 08/04/2024 1100   LYMPHSABS 2.9 11/07/2022 1225   MONOABS 0.5 08/04/2024 1100   EOSABS 0.0 08/04/2024 1100   EOSABS 0.0 11/07/2022 1225   BASOSABS 0.0 08/04/2024 1100   BASOSABS 0.0 11/07/2022 1225    BMET    Component Value Date/Time   NA 140 08/06/2024 0141   NA 144 04/14/2024 0844   K 3.6 08/06/2024 0141   CL 105 08/06/2024 0141   CO2 27 08/06/2024 0141   GLUCOSE 107 (H) 08/06/2024 0141   BUN 18 08/06/2024 0141   BUN 13 04/14/2024 0844   CREATININE 1.02 (H) 08/06/2024 0141   CALCIUM  9.3 08/06/2024 0141   EGFR 75 04/14/2024 0844   GFRNONAA >60 08/06/2024 0141    IMAGING past 24  hours ECHOCARDIOGRAM COMPLETE Result Date: 08/05/2024    ECHOCARDIOGRAM REPORT   Patient Name:   TENNA LACKO Date of Exam: 08/05/2024 Medical Rec #:  [id number]      Height:       64.0 in Accession #:    [id number]     Weight:       269.4 lb Date of Birth:  1961-06-28       BSA:          2.220 m Patient Age:    63 years       BP:           145/96 mmHg Patient Gender: F              HR:           64 bpm. Exam Location:  Inpatient Procedure: 2D Echo, Cardiac Doppler and Color Doppler (Both Spectral and Color            Flow Doppler were utilized during procedure). Indications:    Stroke I63.9  History:        Patient has prior history of Echocardiogram examinations, most                 recent 05/06/2017. CAD; Risk Factors:Hypertension and Sleep  Apnea.  Sonographer:    Jayson Gaskins Referring Phys: [id number] JEFFREY C HATCHER IMPRESSIONS  1. Left ventricular ejection fraction, by estimation, is 55 to 60%. The left ventricle has normal function. The left ventricle has no regional wall motion abnormalities. There is mild left ventricular hypertrophy of the basal-septal segment. Left ventricular diastolic parameters were normal.  2. Right ventricular systolic function is normal. The right ventricular size is normal.  3. The mitral valve is normal in structure. Trivial mitral valve regurgitation. No evidence of mitral stenosis.  4. The aortic valve is tricuspid. Aortic valve regurgitation is not visualized. No aortic stenosis is present. FINDINGS  Left Ventricle: Left ventricular ejection fraction, by estimation, is 55 to 60%. The left ventricle has normal function. The left ventricle has no regional wall motion abnormalities. The left ventricular internal cavity size was normal in size. There is  mild left ventricular hypertrophy of the basal-septal segment. Left ventricular diastolic parameters were normal. Right Ventricle: The right ventricular size is normal. Right ventricular systolic function is normal.  Left Atrium: Left atrial size was normal in size. Right Atrium: Right atrial size was normal in size. Pericardium: There is no evidence of pericardial effusion. Mitral Valve: The mitral valve is normal in structure. Trivial mitral valve regurgitation. No evidence of mitral valve stenosis. Tricuspid Valve: The tricuspid valve is normal in structure. Tricuspid valve regurgitation is not demonstrated. No evidence of tricuspid stenosis. Aortic Valve: The aortic valve is tricuspid. Aortic valve regurgitation is not visualized. No aortic stenosis is present. Aortic valve mean gradient measures 4.0 mmHg. Aortic valve peak gradient measures 6.2 mmHg. Aortic valve area, by VTI measures 2.88 cm. Pulmonic Valve: The pulmonic valve was not well visualized. Pulmonic valve regurgitation is not visualized. No evidence of pulmonic stenosis. Aorta: The aortic root is normal in size and structure. Venous: The inferior vena cava was not well visualized. IAS/Shunts: The interatrial septum was not well visualized.  LEFT VENTRICLE PLAX 2D LVIDd:         4.00 cm   Diastology LVIDs:         2.90 cm   LV e' medial:    9.14 cm/s LV PW:         1.00 cm   LV E/e' medial:  9.2 LV IVS:        1.40 cm   LV e' lateral:   12.10 cm/s LVOT diam:     1.89 cm   LV E/e' lateral: 6.9 LV SV:         65 LV SV Index:   29 LVOT Area:     2.81 cm  RIGHT VENTRICLE RV S prime:     17.00 cm/s TAPSE (M-mode): 2.1 cm LEFT ATRIUM             Index LA Vol (A2C):   50.0 ml 22.52 ml/m LA Vol (A4C):   49.5 ml 22.29 ml/m LA Biplane Vol: 53.4 ml 24.05 ml/m  AORTIC VALVE AV Area (Vmax):    2.42 cm AV Area (Vmean):   2.52 cm AV Area (VTI):     2.88 cm AV Vmax:           124.00 cm/s AV Vmean:          92.200 cm/s AV VTI:            0.226 m AV Peak Grad:      6.2 mmHg AV Mean Grad:      4.0 mmHg LVOT Vmax:  107.00 cm/s LVOT Vmean:        82.900 cm/s LVOT VTI:          0.232 m LVOT/AV VTI ratio: 1.03  AORTA Ao Root diam: 3.33 cm MITRAL VALVE MV Area (PHT): 2.31  cm     SHUNTS MV Decel Time: 328 msec     Systemic VTI:  0.23 m MV E velocity: 83.80 cm/s   Systemic Diam: 1.89 cm MV A velocity: 108.00 cm/s MV E/A ratio:  0.78 Redell Shallow MD Electronically signed by Redell Shallow MD Signature Date/Time: 08/05/2024/12:23:37 PM    Final     Vitals:   08/05/24 1649 08/05/24 2000 08/06/24 0400 08/06/24 0817  BP: (!) 158/91 138/75 135/74 131/77  Pulse: 73 63  70  Resp: 19   18  Temp: 98.3 F (36.8 C) 98.1 F (36.7 C) 98.2 F (36.8 C) 98 F (36.7 C)  TempSrc: Oral Oral Oral Oral  SpO2: 98% 98% 97% 97%  Weight:         PHYSICAL EXAM General:  Alert, well-nourished, well-developed patient in no acute distress CV: Regular rate and rhythm on monitor Respiratory:  Regular, unlabored respirations on room air  NEURO:  Mental Status: AA&Ox3, patient is able to give clear and coherent history Speech/Language: No  aphasia.  Naming, repetition, fluency, and comprehension intact.  Cranial Nerves:  II: PERRL. L eye blurry vision without resulting deficit.  III, IV, VI: EOMI. Eyelids elevate symmetrically.  V: Sensation is intact to light touch and symmetrical to face.  VII: Slight right facial droop VIII: hearing intact to voice. IX, X: Palate elevates symmetrically. No  dysarthria.  KP:Dynloizm shrug 5/5. XII: tongue is midline without fasciculations. Motor:  RUE: slight drift, 4-/5 grip LUE: no drift BLE: 4-/5 with drift, chronic per patient due to prior back surgery and lower back pain Tone: is normal and bulk is normal Sensation- Intact to light touch bilaterally.  Coordination: FTN intact bilaterally.  Decreased fine motor movements right hand, ataxia to right hand.  Gait- deferred      ASSESSMENT/PLAN  Ms. SHATYRA BECKA is a 64 y.o. female with history of HTN, HLD, OSA who presented 12/30 with inconsistent right sided weakness, found to have right sided infarct.  Symptoms do not coincide with infarct found on imaging.  Patient does  have several uncontrolled stroke risk factors and she was admitted for further workup.  NIH on Admission 10  Acute Ischemic Infarct:  right hemisphere ?Acute Left hemispheric infarct not seen on imaging Etiology:  Embolic in patient with multiple uncontrolled stroke risk factors.   Code Stroke CT head   No acute intracranial abnormality in the setting of suspected acute stroke. Mild polypoid mucosal disease in the maxillary sinuses bilaterally. CTA head & neck with perfusion No acute large vessel occlusion. Comparison is CT head dated 08/04/24. No hemodynamically significant stenosis or aneurysm in the head or neck vessels. No evidence of ischemia by CT brain perfusion MRI    Acute infarcts in the right centrum semiovale and posterior right frontal lobe, including the lateral right precentral gyrus. Multiple small remote infarcts in the bilateral cerebellum and bilateral centrum semiovale. Asymmetric prominence of the left Meckel cave with a 5 mm rounded focus of soft tissue, which may represent a schwannoma or meningioma versus other lesion; recommend nonemergent MRI of the skull base with and without contrast. 2D Echo EF 55 to 60%.  Left atrial size is normal. Recommend 30day heart monitor on discharge LDL 73 HgbA1c  5.9 VTE prophylaxis - lovenox  No antithrombotic prior to admission, now on aspirin  81 mg daily and clopidogrel  75 mg daily for 3 weeks and then Aspirin  alone. Aspirin  is listed on patient's home medications. However, she states she was not taking it and hadn't been for a while. She was never prescribed it, just started taking it a while ago and then stopped.  Therapy recommendations:  Pending Disposition:  pending  Hypertension Home meds:  amlodipine  10mg  daily Patient endorses non-compliance, medication compliance education given Stable Blood Pressure Goal: BP less than 220/110 permissive hypertension for first 24 hours after symptom onset, then gradually  normalize  Hyperlipidemia Home meds:  Lipitor 20mg  LDL 73, goal < 70 Increase to 40mg  Continue statin at discharge  Pre-Diabetes Home meds:  none HgbA1c 5.9, goal < 7.0 Recommend close follow-up with PCP   Tobacco Abuse Former Cigarette Smoker  Other Stroke Risk Factors Obesity, Body mass index is 46.24 kg/m., BMI >/= 30 associated with increased stroke risk, recommend weight loss, diet and exercise as appropriate. Family hx stroke (mother) Coronary artery disease L heart cath 2018 Obstructive sleep apnea, on CPAP at home Endorses non-compliance with mask, compliance education given Migraines Home meds: Imitrex  and Topamax     Patient presented with right frontal embolic MCA branch infarct with left sided blurred vision and right sided weakness but MRI does not show definite left brain stroke.  Etiology likely embolic of cryptogenic source.  Recommend continue ongoing workup for checking telemetry monitoring,  and prolonged cardiac monitoring at discharge for paroxysmal A-fib.  Recommend aspirin  and Plavix  for 3 weeks followed by aspirin  alone and aggressive risk factor modification.  Patient counseled to be compliant with using her CPAP every night for sleep apnea. Stroke team will sign off.  Kindly call for questions      Eather Popp, MD Medical Director [facility] Pager: [phone] 08/06/2024 10:08 AM  To contact Stroke Continuity provider, please refer to [email or web address]. After hours, contact General Neurology

## 2024-08-07 ENCOUNTER — Other Ambulatory Visit (HOSPITAL_COMMUNITY): Payer: Self-pay

## 2024-08-07 ENCOUNTER — Encounter (HOSPITAL_COMMUNITY)
Admission: EM | Disposition: A | Discharge status: Home Health | Payer: Self-pay | Source: Home / Self Care | Attending: Infectious Diseases

## 2024-08-07 ENCOUNTER — Inpatient Hospital Stay (HOSPITAL_COMMUNITY): Admitting: Anesthesiology

## 2024-08-07 ENCOUNTER — Inpatient Hospital Stay (HOSPITAL_COMMUNITY)

## 2024-08-07 DIAGNOSIS — I10 Essential (primary) hypertension: Secondary | ICD-10-CM

## 2024-08-07 DIAGNOSIS — I639 Cerebral infarction, unspecified: Secondary | ICD-10-CM

## 2024-08-07 DIAGNOSIS — F418 Other specified anxiety disorders: Secondary | ICD-10-CM

## 2024-08-07 DIAGNOSIS — Z87891 Personal history of nicotine dependence: Secondary | ICD-10-CM | POA: Diagnosis not present

## 2024-08-07 HISTORY — PX: TRANSESOPHAGEAL ECHOCARDIOGRAM (CATH LAB): EP1270

## 2024-08-07 LAB — CBC
HCT: 35.1 % — ABNORMAL LOW (ref 36.0–46.0)
Hemoglobin: 11.2 g/dL — ABNORMAL LOW (ref 12.0–15.0)
MCH: 28.7 pg (ref 26.0–34.0)
MCHC: 31.9 g/dL (ref 30.0–36.0)
MCV: 90 fL (ref 80.0–100.0)
Platelets: 209 K/uL (ref 150–400)
RBC: 3.9 MIL/uL (ref 3.87–5.11)
RDW: 15.1 % (ref 11.5–15.5)
WBC: 5.7 K/uL (ref 4.0–10.5)
nRBC: 0 % (ref 0.0–0.2)

## 2024-08-07 LAB — BASIC METABOLIC PANEL WITH GFR
Anion gap: 9 (ref 5–15)
BUN: 18 mg/dL (ref 8–23)
CO2: 27 mmol/L (ref 22–32)
Calcium: 9.1 mg/dL (ref 8.9–10.3)
Chloride: 105 mmol/L (ref 98–111)
Creatinine, Ser: 1.04 mg/dL — ABNORMAL HIGH (ref 0.44–1.00)
GFR, Estimated: >60 mL/min
Glucose, Bld: 106 mg/dL — ABNORMAL HIGH (ref 70–99)
Potassium: 3.6 mmol/L (ref 3.5–5.1)
Sodium: 141 mmol/L (ref 135–145)

## 2024-08-07 LAB — ECHO TEE

## 2024-08-07 SURGERY — TRANSESOPHAGEAL ECHOCARDIOGRAM (TEE) (CATHLAB)
Anesthesia: Monitor Anesthesia Care

## 2024-08-07 MED ORDER — ATORVASTATIN CALCIUM 40 MG PO TABS
40.0000 mg | ORAL_TABLET | Freq: Every day | ORAL | 0 refills | Status: DC
  Filled 2024-08-07: qty 30, 30d supply, fill #0

## 2024-08-07 MED ORDER — PROPOFOL 10 MG/ML IV BOLUS
INTRAVENOUS | Status: DC | PRN
  Administered 2024-08-07: 50 mg via INTRAVENOUS

## 2024-08-07 MED ORDER — PROPOFOL 500 MG/50ML IV EMUL
INTRAVENOUS | Status: DC | PRN
  Administered 2024-08-07: 150 ug/kg/min via INTRAVENOUS

## 2024-08-07 MED ORDER — LIDOCAINE 2% (20 MG/ML) 5 ML SYRINGE
INTRAMUSCULAR | Status: DC | PRN
  Administered 2024-08-07: 60 mg via INTRAVENOUS

## 2024-08-07 MED ORDER — ASPIRIN 81 MG PO TBEC
81.0000 mg | DELAYED_RELEASE_TABLET | Freq: Every day | ORAL | 12 refills | Status: AC
  Filled 2024-08-07: qty 30, 30d supply, fill #0

## 2024-08-07 MED ORDER — CLOPIDOGREL BISULFATE 75 MG PO TABS
75.0000 mg | ORAL_TABLET | Freq: Every day | ORAL | 0 refills | Status: AC
  Filled 2024-08-07: qty 21, 21d supply, fill #0

## 2024-08-07 NOTE — TOC Transition Note (Signed)
 Transition of Care [facility]) - Discharge Note   Patient Details  Name: Cathy [name] MRN: [id number] Date of Birth: 01/31/1961  Transition of Care [facility]) CM/SW Contact:  Andrez JULIANNA George, RN Phone Number: 08/07/2024, 1:40 PM   Clinical Narrative:     Pt is discharging home with outpatient therapy through [facility]. Information on the AVS. Pt will call to schedule the first appointment. Pt has transportation home.  Final next level of care: OP Rehab Barriers to Discharge: No Barriers Identified   Patient Goals and CMS Choice Patient states their goals for this hospitalization and ongoing recovery are:: return home CMS Medicare.gov Compare Post Acute Care list provided to:: Patient Choice offered to / list presented to : Patient      Discharge Placement                       Discharge Plan and Services Additional resources added to the After Visit Summary for     Discharge Planning Services: CM Consult Post Acute Care Choice: Durable Medical Equipment          DME Arranged: Tub bench DME Agency: [employer] Date DME Agency Contacted: 08/06/24 Time DME Agency Contacted: 1501 Representative spoke with at DME Agency: [place]            Social Drivers of Health (SDOH) Interventions SDOH Screenings   Food Insecurity: No Food Insecurity (08/05/2024)  Housing: Low Risk (08/05/2024)  Transportation Needs: No Transportation Needs (08/05/2024)  Utilities: Not At Risk (08/05/2024)  Alcohol Screen: Low Risk (02/18/2024)  Depression (PHQ2-9): High Risk (04/13/2024)  Financial Resource Strain: Low Risk (02/18/2024)  Physical Activity: Insufficiently Active (02/18/2024)  Social Connections: Unknown (08/05/2024)  Stress: Stress Concern Present (02/18/2024)  Tobacco Use: Medium Risk (08/04/2024)  Health Literacy: Adequate Health Literacy (09/03/2023)     Readmission Risk Interventions     No data to display

## 2024-08-07 NOTE — Discharge Summary (Signed)
 "  Name: Cathy [name] MRN: [id number] DOB: [birth year] 64 y.o. PCP: Delbert Clam, MD  Date of Admission: 08/04/2024 10:59 AM Date of Discharge: 08/07/24 Attending Physician: Dr. Reyes Fenton  Discharge Diagnosis: 1. Principal Problem:   Acute ischemic stroke [facility])  Discharge Medications: Allergies as of 08/07/2024       Reactions   Isovue  [iopamidol ] Tinitus   Sneezing and congestion post contrast administration, pt will require premeds for future contrasted exams        Medication List     STOP taking these medications    acetaminophen  500 MG tablet Commonly known as: TYLENOL    amLODipine  10 MG tablet Commonly known as: NORVASC    DULoxetine  60 MG capsule Commonly known as: Cymbalta    HYDROcodone -acetaminophen  5-325 MG tablet Commonly known as: NORCO/VICODIN   ibuprofen  600 MG tablet Commonly known as: ADVIL    SUMAtriptan  50 MG tablet Commonly known as: Imitrex    topiramate  100 MG tablet Commonly known as: Topamax        TAKE these medications    albuterol  108 (90 Base) MCG/ACT inhaler Commonly known as: VENTOLIN  HFA Inhale 1-2 puffs into the lungs every 6 (six) hours as needed for wheezing or shortness of breath.   aspirin  EC 81 MG tablet Take 1 tablet (81 mg total) by mouth daily. Swallow whole. Start taking on: August 08, 2024 What changed:  when to take this additional instructions   atorvastatin  40 MG tablet Commonly known as: LIPITOR Take 1 tablet (40 mg total) by mouth daily. Start taking on: August 08, 2024 What changed:  medication strength how much to take   cetirizine  10 MG tablet Commonly known as: ZYRTEC  Take 1 tablet (10 mg total) by mouth daily. What changed:  when to take this reasons to take this   chlorthalidone  25 MG tablet Commonly known as: HYGROTON  Take 1 tablet (25 mg total) by mouth daily.   clopidogrel  75 MG tablet Commonly known as: PLAVIX  Take 1 tablet (75 mg total) by mouth daily. Start taking on:  August 08, 2024   diclofenac  Sodium 1 % Gel Commonly known as: Voltaren  Apply 4 g topically 4 (four) times daily. What changed:  when to take this reasons to take this   fluticasone  44 MCG/ACT inhaler Commonly known as: Flovent  HFA Inhale 2 puffs into the lungs 2 (two) times daily.   fluticasone  50 MCG/ACT nasal spray Commonly known as: FLONASE  Place 2 sprays into both nostrils daily.   lidocaine  5 % Commonly known as: Lidoderm  Place 1 patch onto the skin daily. Remove & Discard patch within 12 hours or as directed by MD   Misc. Devices Misc CPAP, AutoPap 5 - 20 cm water.  Diagnosis-obstructive sleep apnea   nitroGLYCERIN  0.4 MG SL tablet Commonly known as: NITROSTAT  Place 1 tablet (0.4 mg total) under the tongue every 5 (five) minutes as needed for chest pain.   tiZANidine  4 MG tablet Commonly known as: Zanaflex  Take 1 tablet (4 mg total) by mouth 3 (three) times daily as needed for muscle spasms.               Durable Medical Equipment  (From admission, onward)           Start     Ordered   08/06/24 1456  For home use only DME Other see comment  Once       Comments: Tub bench  Question:  Length of Need  Answer:  Lifetime   08/06/24 1457  Disposition and follow-up:   [name] was discharged from [facility] in Stable condition.  At the hospital follow up visit please address:  1.  Please visit Dr. Corrina Sabin (PCP ) in 7-14 days  2. Follow-up with cardiology on 09/24/2024 with Emeline Calender, DO  3. You will be contacted to arrange physical therapy and Occupational Therapy at discharge  4.  Labs / imaging needed at time of follow-up: nonemergent MRI of the skull base with and without contrast, concern for schwannoma or meningioma  5.  Pending labs/ test needing follow-up: n/a  Follow-up Appointments:  Follow-up Information     [facility] Follow up.   Specialty:  Rehabilitation Why: Call to schedule apt for PT, OT, Speech Contact information: [address] [place]  [address] [phone]        Sabin Corrina, MD. Schedule an appointment as soon as possible for a visit in 1 week(s).   Specialty: Family Medicine Why: Please make an appointment with your PCP for hospital follow up Contact information: [address] [place] [address] [place] [address] [phone]                Hospital Course by problem list: Cathy [name] is a 64 y.o. person living with a history of HTN, GERD, anxiety and depression, OSA, migraines with aura who presented with right sided arm and leg weakness and dysarthria and admitted for multiple acute infarcts in the right hemisphere, now being discharged on hospital day 2 with the following pertinent hospital course:  #Acute ischemic stroke  Presented with dysarthria and right-sided weakness and sensory loss along with transient left-sided blurry vision.  Found to have multiple strokes on MRI in the right centrum semiovale and posterior right frontal lobe.  There were also small remote infarcts in the bilateral cerebellum and centrum semiovale. CT angiogram showed no large vessel occlusion.  She has had symptoms in the past that her may be concerning for strokes but have resolved on their own and could potentially be attributed to migraines with aura given their description. Her deficits were not well explained by her MRI, as they were ipsilateral. A1c 5.9. lipid panel with LDL of 73. BC negative. Strength and dysarthria improved throughout hospitalization. Started on DAPT. She can stop Plavix  3 weeks after initiation. Echo without any cause of ischemic stroke identified. TEE showed no cardioembolic source of stroke. Atorvastatin  increased to 40mg  daily. PT/OT recommended HHPT and HHOT. She was discharged with a Holter monitor    #HTN  Home regimen chlorthalidone  25 mg daily and amlodipine  10  mg daily, she has only been taking the chlorthalidone . BP elevated at 157/123 on admission. BP remained low without antihypertensives and they were held throughout admission. She was discharged on a regimen of chlorthalidone  25 mg daily .    #HLD  LDL this admission 73. Home medication atorvastatin  20 mg.  Atorvastatin  increased to high intensity dosage of 40 mg given her stroke.   #Hx of migraine with aura  The migraines that she has put her at increased risk of stroke.  She is prescribed sumatriptan  but has not taken this or hardly any other medications due to concerns that this is making her hair fall out.  She was reassured that sumatriptan  would not make her hair fall out and that she could restart on discharge.   #Normocytic anemia  Hgb 11.2 on admission. MCV 92.2. No significant bleeding. Stable throughout admission.  Subjective Continues to report improvement in the strength of her right upper and lower extremities, continues to endorse sensation deficit in R side. Denies new concerns or questions, excited to get TEE done today.   Discharge Exam:   BP (!) 144/89   Pulse 68   Temp (!) 97.4 F (36.3 C) (Tympanic)   Resp 20   Ht 5' 4 (1.626 m)   Wt 122.2 kg   SpO2 97%   BMI 46.24 kg/m  Discharge exam:  Constitutional:      General: She is not in acute distress.    Appearance: She is not ill-appearing.  Eyes:     Extraocular Movements: Extraocular movements intact.  Cardiovascular:     Rate and Rhythm: Normal rate and regular rhythm.  Pulmonary:     Effort: Pulmonary effort is normal. No respiratory distress.  Neurological:     Mental Status: She is alert.     Sensory: Sensory deficit present.     Motor: Weakness present.     Comments: 4+/5 strength right lower extremity 4+/5 strength right upper extremity  Improved dysarthria  Pertinent Labs, Studies, and Procedures:     Latest Ref Rng & Units 08/07/2024    2:40 AM 08/06/2024    1:41 AM 08/05/2024    1:40 AM  CBC   WBC 4.0 - 10.5 K/uL 5.7  6.0  6.0   Hemoglobin 12.0 - 15.0 g/dL 88.7  89.0  88.6   Hematocrit 36.0 - 46.0 % 35.1  34.1  36.1   Platelets 150 - 400 K/uL 209  214  197        Latest Ref Rng & Units 08/07/2024    2:40 AM 08/06/2024    1:41 AM 08/05/2024    1:40 AM  CMP  Glucose 70 - 99 mg/dL 893  892  893   BUN 8 - 23 mg/dL 18  18  16    Creatinine 0.44 - 1.00 mg/dL 8.95  8.97  8.92   Sodium 135 - 145 mmol/L 141  140  141   Potassium 3.5 - 5.1 mmol/L 3.6  3.6  3.8   Chloride 98 - 111 mmol/L 105  105  107   CO2 22 - 32 mmol/L 27  27  25    Calcium  8.9 - 10.3 mg/dL 9.1  9.3  9.5     ECHOCARDIOGRAM COMPLETE Result Date: 08/05/2024    ECHOCARDIOGRAM REPORT   Patient Name:   Cathy [name] Date of Exam: 08/05/2024 Medical Rec #:  [id number]      Height:       64.0 in Accession #:    [id number]     Weight:       269.4 lb Date of Birth:  May 10, 1961       BSA:          2.220 m Patient Age:    63 years       BP:           145/96 mmHg Patient Gender: F              HR:           64 bpm. Exam Location:  Inpatient Procedure: 2D Echo, Cardiac Doppler and Color Doppler (Both Spectral and Color            Flow Doppler were utilized during procedure). Indications:    Stroke I63.9  History:        Patient has prior history of Echocardiogram examinations, most  recent 05/06/2017. CAD; Risk Factors:Hypertension and Sleep                 Apnea.  Sonographer:    Jayson Gaskins Referring Phys: [id number] JEFFREY C HATCHER IMPRESSIONS  1. Left ventricular ejection fraction, by estimation, is 55 to 60%. The left ventricle has normal function. The left ventricle has no regional wall motion abnormalities. There is mild left ventricular hypertrophy of the basal-septal segment. Left ventricular diastolic parameters were normal.  2. Right ventricular systolic function is normal. The right ventricular size is normal.  3. The mitral valve is normal in structure. Trivial mitral valve regurgitation. No evidence of mitral  stenosis.  4. The aortic valve is tricuspid. Aortic valve regurgitation is not visualized. No aortic stenosis is present. FINDINGS  Left Ventricle: Left ventricular ejection fraction, by estimation, is 55 to 60%. The left ventricle has normal function. The left ventricle has no regional wall motion abnormalities. The left ventricular internal cavity size was normal in size. There is  mild left ventricular hypertrophy of the basal-septal segment. Left ventricular diastolic parameters were normal. Right Ventricle: The right ventricular size is normal. Right ventricular systolic function is normal. Left Atrium: Left atrial size was normal in size. Right Atrium: Right atrial size was normal in size. Pericardium: There is no evidence of pericardial effusion. Mitral Valve: The mitral valve is normal in structure. Trivial mitral valve regurgitation. No evidence of mitral valve stenosis. Tricuspid Valve: The tricuspid valve is normal in structure. Tricuspid valve regurgitation is not demonstrated. No evidence of tricuspid stenosis. Aortic Valve: The aortic valve is tricuspid. Aortic valve regurgitation is not visualized. No aortic stenosis is present. Aortic valve mean gradient measures 4.0 mmHg. Aortic valve peak gradient measures 6.2 mmHg. Aortic valve area, by VTI measures 2.88 cm. Pulmonic Valve: The pulmonic valve was not well visualized. Pulmonic valve regurgitation is not visualized. No evidence of pulmonic stenosis. Aorta: The aortic root is normal in size and structure. Venous: The inferior vena cava was not well visualized. IAS/Shunts: The interatrial septum was not well visualized.  LEFT VENTRICLE PLAX 2D LVIDd:         4.00 cm   Diastology LVIDs:         2.90 cm   LV e' medial:    9.14 cm/s LV PW:         1.00 cm   LV E/e' medial:  9.2 LV IVS:        1.40 cm   LV e' lateral:   12.10 cm/s LVOT diam:     1.89 cm   LV E/e' lateral: 6.9 LV SV:         65 LV SV Index:   29 LVOT Area:     2.81 cm  RIGHT VENTRICLE RV  S prime:     17.00 cm/s TAPSE (M-mode): 2.1 cm LEFT ATRIUM             Index LA Vol (A2C):   50.0 ml 22.52 ml/m LA Vol (A4C):   49.5 ml 22.29 ml/m LA Biplane Vol: 53.4 ml 24.05 ml/m  AORTIC VALVE AV Area (Vmax):    2.42 cm AV Area (Vmean):   2.52 cm AV Area (VTI):     2.88 cm AV Vmax:           124.00 cm/s AV Vmean:          92.200 cm/s AV VTI:            0.226 m AV Peak  Grad:      6.2 mmHg AV Mean Grad:      4.0 mmHg LVOT Vmax:         107.00 cm/s LVOT Vmean:        82.900 cm/s LVOT VTI:          0.232 m LVOT/AV VTI ratio: 1.03  AORTA Ao Root diam: 3.33 cm MITRAL VALVE MV Area (PHT): 2.31 cm     SHUNTS MV Decel Time: 328 msec     Systemic VTI:  0.23 m MV E velocity: 83.80 cm/s   Systemic Diam: 1.89 cm MV A velocity: 108.00 cm/s MV E/A ratio:  0.78 Redell Shallow MD Electronically signed by Redell Shallow MD Signature Date/Time: 08/05/2024/12:23:37 PM    Final    MR BRAIN WO CONTRAST Result Date: 08/04/2024 EXAM: MRI BRAIN WITHOUT CONTRAST 08/04/2024 01:26:20 PM TECHNIQUE: Multiplanar multisequence MRI of the head/brain was performed without the administration of intravenous contrast. COMPARISON: Same day CT head and CTA head and neck. CLINICAL HISTORY: Neuro deficit, acute, stroke suspected. FINDINGS: BRAIN AND VENTRICLES: 8 mm focus of acute infarct within the right centrum semiovale. There are additional scattered areas of acute infarct involving the cortex and subcortical white matter within the posterior right frontal lobe including portions of the lateral right precentral gyrus. T2 and FLAIR hyperintensity in the periventricular and subcortical white matter compatible with mild chronic microvascular ischemic changes. Multiple small remote infarcts in the bilateral cerebellum. Small remote infarcts in the bilateral centrum semiovale. There is asymmetric prominence of the left Meckel's cave with a 5 mm rounded focus of soft tissue within which could reflect a schwannoma or meningioma versus other  lesions. Recommend nonemergent correlation with MRI of the skull base with and without contrast. No intracranial hemorrhage. No midline shift. No hydrocephalus. The sella is unremarkable. Normal flow voids. ORBITS: No acute abnormality. SINUSES AND MASTOIDS: Retention cyst in the left maxillary sinus. BONES AND SOFT TISSUES: Normal marrow signal. No acute soft tissue abnormality. IMPRESSION: 1. Acute infarcts in the right centrum semiovale and posterior right frontal lobe, including the lateral right precentral gyrus. 2. Multiple small remote infarcts in the bilateral cerebellum and bilateral centrum semiovale. 3. Asymmetric prominence of the left Meckel cave with a 5 mm rounded focus of soft tissue, which may represent a schwannoma or meningioma versus other lesion; recommend nonemergent MRI of the skull base with and without contrast. Electronically signed by: Donnice Mania MD 08/04/2024 03:07 PM EST RP Workstation: [id number]   CT ANGIO HEAD NECK W WO CM W PERF (CODE STROKE) Result Date: 08/04/2024 EXAM: CTA Head and Neck with Perfusion 08/04/2024 10:56:20 AM TECHNIQUE: CTA of the head and neck was performed with and without the administration of 100 mL of iohexol  (OMNIPAQUE ) 350 MG/ML injection. 3D postprocessing with multiplanar reconstructions and MIPs was performed to evaluate the vascular anatomy. Cerebral perfusion analysis using computed tomography with contrast administration, including post-processing of parametric maps with determination of cerebral blood flow, cerebral blood volume, mean transit time and time-to-maximum. Automated exposure control, iterative reconstruction, and/or weight based adjustment of the mA/kV was utilized to reduce the radiation dose to as low as reasonably achievable. COMPARISON: CT of the head dated 08/04/2024. CLINICAL HISTORY: Neuro deficit, acute, stroke suspected. FINDINGS: CTA NECK: AORTIC ARCH AND ARCH VESSELS: No dissection or arterial injury. No significant  stenosis of the brachiocephalic or subclavian arteries. CERVICAL CAROTID ARTERIES: No dissection, arterial injury, or hemodynamically significant stenosis by NASCET criteria. CERVICAL VERTEBRAL ARTERIES: No dissection, arterial injury, or  significant stenosis. LUNGS AND MEDIASTINUM: Unremarkable. SOFT TISSUES: No acute abnormality. BONES: No acute abnormality. CTA HEAD: ANTERIOR CIRCULATION: No significant stenosis of the internal carotid arteries. No significant stenosis of the anterior cerebral arteries. No significant stenosis of the middle cerebral arteries. No aneurysm. POSTERIOR CIRCULATION: No significant stenosis of the posterior cerebral arteries. No significant stenosis of the basilar artery. No significant stenosis of the vertebral arteries. No aneurysm. OTHER: No dural venous sinus thrombosis on this non-dedicated study. The above findings were communicated to Doctor Michaela at 11:11 AM on 08/04/2024. CT PERFUSION: EXAM QUALITY: Exam quality is adequate with diagnostic perfusion maps. No significant motion artifact. Appropriate arterial inflow and venous outflow curves. CORE INFARCT (CBF<30% volume): 0 mL TOTAL HYPOPERFUSION (Tmax>6s volume): 0 mL PENUMBRA: Mismatch volume: 0 mL Mismatch ratio: not applicable Location: not applicable IMPRESSION: 1. No acute large vessel occlusion. Comparison is CT head dated 08/04/24. 2. No hemodynamically significant stenosis or aneurysm in the head or neck vessels. 3. No evidence of ischemia by CT brain perfusion. 4. The above findings were communicated to Dr. Michaela at 11:11 AM on 08/04/24. Electronically signed by: Evalene Coho MD 08/04/2024 11:13 AM EST RP Workstation: [id number]   CT HEAD CODE STROKE WO CONTRAST (LKW 0-4.5h, LVO 0-24h) Result Date: 08/04/2024 EXAM: CT HEAD WITHOUT CONTRAST 08/04/2024 10:41:00 AM TECHNIQUE: CT of the head was performed without the administration of intravenous contrast. Automated exposure control, iterative  reconstruction, and/or weight based adjustment of the mA/kV was utilized to reduce the radiation dose to as low as reasonably achievable. COMPARISON: CT of the head dated 09/26/2023. CLINICAL HISTORY: Neuro deficit, acute, stroke suspected. FINDINGS: BRAIN AND VENTRICLES: No acute hemorrhage. No evidence of acute infarct. No hydrocephalus. No extra-axial collection. No mass effect or midline shift. [place] Stroke Program Early CT Score (ASPECTS): Ganglionic (caudate, IC, lentiform nucleus, insula, M1-M3): 7. Supraganglionic (M4-M6): 3. Total: 10. ORBITS: No acute abnormality. SINUSES: Mild polypoid mucosal disease within the maxillary sinuses bilaterally. SOFT TISSUES AND SKULL: No acute soft tissue abnormality. No skull fracture. IMPRESSION: 1. No acute intracranial abnormality in the setting of suspected acute stroke. 2. Mild polypoid mucosal disease in the maxillary sinuses bilaterally. 3. Findings cummunicated to Dr. Michaela at 10:48 AM on 08/04/24. Aspects: 10 Electronically signed by: Evalene Coho MD 08/04/2024 10:50 AM EST RP Workstation: [id number]    Discharge Instructions: Discharge Instructions     Ambulatory referral to Occupational Therapy   Complete by: As directed    Ambulatory referral to Physical Therapy   Complete by: As directed    Ambulatory referral to Speech Therapy   Complete by: As directed       Signed: Idelle Nakai, DO 08/07/2024, 1:25 PM   "

## 2024-08-07 NOTE — Progress Notes (Addendum)
 " HD#2 SUBJECTIVE:  Patient Summary: Cathy [name] is a 64 y.o. with a pertinent PMH of HTN, GERD, anxiety and depression, OSA, migraines with aura who presented with right sided arm and leg weakness and dysarthria and admitted for multiple acute infarcts in the right hemisphere.   Overnight Events: None  Interim History: Continues to report improvement in the strength of her right upper and lower extremities, continues to endorse sensation deficit in R side. Denies new concerns or questions, would like to get TEE done.  OBJECTIVE:  Vital Signs: Vitals:   08/06/24 1611 08/06/24 1947 08/06/24 2326 08/07/24 0433  BP: 136/89 126/81 117/79 124/81  Pulse: 74 67 73 67  Resp: 18 18 18 18   Temp: 97.7 F (36.5 C) 97.9 F (36.6 C) 98.7 F (37.1 C) 98 F (36.7 C)  TempSrc: Oral Oral Oral Oral  SpO2: 98% 97% 97% 100%  Weight:       Supplemental O2: Room Air SpO2: 100 %  Filed Weights   08/04/24 1049  Weight: 122.2 kg    No intake or output data in the 24 hours ending 08/07/24 0654 Net IO Since Admission: No IO data has been entered for this period [08/07/24 0654]  Physical Exam: Physical Exam Constitutional:      General: She is not in acute distress.    Appearance: She is not ill-appearing.  Eyes:     Extraocular Movements: Extraocular movements intact.  Cardiovascular:     Rate and Rhythm: Normal rate and regular rhythm.  Pulmonary:     Effort: Pulmonary effort is normal. No respiratory distress.  Neurological:     Mental Status: She is alert.     Sensory: Sensory deficit present.     Motor: Weakness present.     Comments: 4+/5 strength right lower extremity 4+/5 strength right upper extremity  Improved dysarthria    Patient Lines/Drains/Airways Status     Active Line/Drains/Airways     Name Placement date Placement time Site Days   Peripheral IV 08/06/24 20 G 2.5 Anterior;Right Forearm 08/06/24  2151  Forearm  1            Pertinent labs and imaging:       Latest Ref Rng & Units 08/07/2024    2:40 AM 08/06/2024    1:41 AM 08/05/2024    1:40 AM  CBC  WBC 4.0 - 10.5 K/uL 5.7  6.0  6.0   Hemoglobin 12.0 - 15.0 g/dL 88.7  89.0  88.6   Hematocrit 36.0 - 46.0 % 35.1  34.1  36.1   Platelets 150 - 400 K/uL 209  214  197        Latest Ref Rng & Units 08/07/2024    2:40 AM 08/06/2024    1:41 AM 08/05/2024    1:40 AM  CMP  Glucose 70 - 99 mg/dL 893  892  893   BUN 8 - 23 mg/dL 18  18  16    Creatinine 0.44 - 1.00 mg/dL 8.95  8.97  8.92   Sodium 135 - 145 mmol/L 141  140  141   Potassium 3.5 - 5.1 mmol/L 3.6  3.6  3.8   Chloride 98 - 111 mmol/L 105  105  107   CO2 22 - 32 mmol/L 27  27  25    Calcium  8.9 - 10.3 mg/dL 9.1  9.3  9.5    J8R 5.9  Lipid Panel     Component Value Date/Time   CHOL 149 08/04/2024 2045  CHOL 151 04/14/2024 0844   TRIG 62 08/04/2024 2045   HDL 63 08/04/2024 2045   HDL 68 04/14/2024 0844   CHOLHDL 2.4 08/04/2024 2045   VLDL 12 08/04/2024 2045   LDLCALC 73 08/04/2024 2045   LDLCALC 71 04/14/2024 0844   LABVLDL 12 04/14/2024 0844   Blood cultures negative to date No results found.  ASSESSMENT/PLAN:  Assessment: Principal Problem:   Acute ischemic stroke (HCC)  Cathy [name] is a 64 y.o. person living with a history of HTN, GERD, anxiety and depression, OSA, migraines with aura who presented with right sided arm and leg weakness and dysarthria and admitted for multiple acute infarcts in the right hemisphere.   Plan: #Acute ischemic stroke  Presented with dysarthria and right-sided weakness and sensory loss along with transient left-sided blurry vision.  Found to have multiple strokes on MRI in the right centrum semiovale and posterior right frontal lobe.  There were also small remote infarcts in the bilateral cerebellum and centrum semiovale. CT angiogram showed no large vessel occlusion.  She has had symptoms in the past that her may be concerning for strokes but have resolved on their own and could  potentially be attributed to migraines with aura given their description. Her weakness has improved and she has been able to ambulate to the restroom with assistance. Her deficits are not well explained by her MRI, as they are ipsilateral. A1c 5.9 lipid panel with LDL of 73. BC negative at 3 days. PT/OT recommending HHPT and HHOT. -Neurology consulted appreciate recommendations -TEE, cardiology consulted and plan for procedure today -Holter monitor on discharge - Aspirin  81mg  daily indefinitely - Plavix  75mg  daily for 3 weeks - Lovenox  for DVT prophylaxis - Atorvastatin  to 40 mg daily - Echo with EF 55 to 60%  #HTN  Home regimen chlorthalidone  25 mg daily and amlodipine  10 mg daily, she has only been taking the chlorthalidone . BP elevated at 157/123 on admission. BP 129/84 this morning, so we will continue to hold antihypertensives. OK to restart later if persistently elevated.  - Hold antihypertensives, may restart later if persistently elevated -Plan to restart chlorthalidone  25 mg daily upon discharge   #HLD  LDL this admission 73. Home medication atorvastatin  20 mg, increased to high intensity dosage of 40 mg given her stroke. - Continue atorvastatin  40 mg daily   #Hx of migraine with aura  The migraines that she has put her at increased risk of stroke.  She is prescribed sumatriptan  but has not taken this or hardly any other medications due to concerns that this is making her hair fall out.  She was reassured that sumatriptan  would not make her hair fall out. Is okay for her to restart this at discharge.   #Normocytic anemia  Hgb 11.2 on admission. MCV 92.2. No significant bleeding. Stable at 11.2 this morning.  -trend daily CBC   Best Practice: Diet: Heart healthy VTE: enoxaparin  (LOVENOX ) injection 40 mg Start: 08/04/24 2000 Code: Full  Disposition planning: Therapy Recs: Pending, Family Contact: Husband, at bedside. DISPO: Anticipated discharge today after  TEE  Signature: Brigett Estell [facility]  6:54 AM, 08/07/2024  On Call pager [phone] "

## 2024-08-07 NOTE — Anesthesia Preprocedure Evaluation (Signed)
"                                    Anesthesia Evaluation  Patient identified by MRN, date of birth, ID band Patient awake    Reviewed: Allergy & Precautions, NPO status , Patient's Chart, lab work & pertinent test results, reviewed documented beta blocker date and time   History of Anesthesia Complications Negative for: history of anesthetic complications  Airway Mallampati: III  TM Distance: >3 FB     Dental no notable dental hx.    Pulmonary asthma , sleep apnea and Continuous Positive Airway Pressure Ventilation , former smoker   breath sounds clear to auscultation       Cardiovascular hypertension, (-) CAD, (-) Past MI and (-) Cardiac Stents  Rhythm:Regular Rate:Normal     Neuro/Psych  PSYCHIATRIC DISORDERS Anxiety Depression    CVA, Residual Symptoms    GI/Hepatic ,GERD  ,,(+) neg Cirrhosis        Endo/Other    Class 4 obesity  Renal/GU Renal disease     Musculoskeletal  (+) Arthritis , Osteoarthritis,    Abdominal   Peds  Hematology   Anesthesia Other Findings   Reproductive/Obstetrics                              Anesthesia Physical Anesthesia Plan  ASA: 3  Anesthesia Plan: MAC   Post-op Pain Management:    Induction: Intravenous  PONV Risk Score and Plan: 2 and Ondansetron  and Propofol infusion  Airway Management Planned: Natural Airway and Nasal Cannula  Additional Equipment:   Intra-op Plan:   Post-operative Plan:   Informed Consent: I have reviewed the patients History and Physical, chart, labs and discussed the procedure including the risks, benefits and alternatives for the proposed anesthesia with the patient or authorized representative who has indicated his/her understanding and acceptance.     Dental advisory given  Plan Discussed with: CRNA  Anesthesia Plan Comments:         Anesthesia Quick Evaluation  "

## 2024-08-07 NOTE — Progress Notes (Signed)
 Cathy DELENA Oka to be discharged home per MD order. Discussed with the patient and all questions fully answered. TOC meds picked up from pharmacy. Skin clean, dry, and intact without evidence of skin break down. IV catheter discontinued intact. Site without signs and symptoms of complications. Dressing and pressure applied.  An After Visit Summary was printed and given to the patient.  Patient escorted via [facility], and discharged home via private auto.  [name]  08/07/2024 3:36 PM

## 2024-08-07 NOTE — Progress Notes (Signed)
 CSW met with patient per request to discuss note for work for daughter who provided transportation for the patient to the hospital when she arrived. Patient reported the daughter was the one who brought the patient to the ED. CSW compiled letter for patient to give to her daughter.  Almarie Goodie, [place] Clinical Social Worker [phone]

## 2024-08-07 NOTE — Anesthesia Postprocedure Evaluation (Signed)
"   Anesthesia Post Note  Patient: Cathy [name]  Procedure(s) Performed: TRANSESOPHAGEAL ECHOCARDIOGRAM     Patient location during evaluation: PACU Anesthesia Type: MAC Level of consciousness: awake and alert Pain management: pain level controlled Vital Signs Assessment: post-procedure vital signs reviewed and stable Respiratory status: spontaneous breathing, nonlabored ventilation, respiratory function stable and patient connected to nasal cannula oxygen  Cardiovascular status: blood pressure returned to baseline and stable Postop Assessment: no apparent nausea or vomiting Anesthetic complications: no   There were no known notable events for this encounter.  Last Vitals:  Vitals:   08/07/24 1236 08/07/24 1338  BP: (!) 144/89 (!) 154/78  Pulse: 68 70  Resp: 20   Temp:  37.1 C  SpO2: 97% 98%    Last Pain:  Vitals:   08/07/24 1338  TempSrc: Oral  PainSc:                  Lynwood MARLA Cornea      "

## 2024-08-07 NOTE — Discharge Instructions (Addendum)
 TEE  YOU HAD AN CARDIAC PROCEDURE TODAY: Refer to the procedure report and other information in the discharge instructions given to you for any specific questions about what was found during the examination. If this information does not answer your questions, please call [facility] office at [phone] to clarify.   DIET: Your first meal following the procedure should be a light meal and then it is ok to progress to your normal diet. A half-sandwich or bowl of soup is an example of a good first meal. Heavy or fried foods are harder to digest and may make you feel nauseous or bloated. Drink plenty of fluids but you should avoid alcoholic beverages for 24 hours. If you had a esophageal dilation, please see attached instructions for diet.   ACTIVITY: Your care partner should take you home directly after the procedure. You should plan to take it easy, moving slowly for the rest of the day. You can resume normal activity the day after the procedure however YOU SHOULD NOT DRIVE, use power tools, machinery or perform tasks that involve climbing or major physical exertion for 24 hours (because of the sedation medicines used during the test).   SYMPTOMS TO REPORT IMMEDIATELY: A cardiologist can be reached at any hour. Please call [phone] for any of the following symptoms:  Vomiting of blood or coffee ground material  New, significant abdominal pain  New, significant chest pain or pain under the shoulder blades  Painful or persistently difficult swallowing  New shortness of breath  Black, tarry-looking or red, bloody stools  FOLLOW UP:  Please also call with any specific questions about appointments or follow up tests.  Thank you for allowing us  to be part of your care. You were hospitalized for acute stroke. We treated you by ruling out cardioembolic sources of stroke, or other embolic sources of stroke and optimizing medications.  See the changes in your medications and management of your  chronic conditions below:  *For your stroke -We have STARTED you on these following medications:  - Increased atorvastatin  40 mg daily  - Continue aspirin  81 mg daily indefinitely  - Continue Plavix  75 mg daily for 3 weeks  *For your hypertension -We have STARTED you on these following medications:  -chlorthalidone  25 mg daily  -We have STOPPED the following medications:  -amlodipine  10 mg daily   FOLLOW UP APPOINTMENTS: Please visit Dr. Corrina Sabin (PCP ) in 7-14 days You should also be contacted to arrange physical therapy and Occupational Therapy at discharge  Follow-up with cardiology on 09/24/2024 with Emeline Calender, DO  Please call your PCP or our clinic if you have any questions or concerns, we may be able to help and keep you from a long and expensive emergency room wait. Our clinic and after hours phone number is [phone]. The best time to call is Monday through Friday 9 am to 4 pm but there is always someone available 24/7 if you have an emergency. If you need medication refills please notify your pharmacy one week in advance and they will send us  a request.   We are glad you are feeling better,  Alfornia Light, DO Internal Medicine Inpatient Teaching Service at [facility]

## 2024-08-07 NOTE — Progress Notes (Signed)
 Physical Therapy Treatment Patient Details Name: Cathy [name] MRN: [id number] DOB: December 03, 1960 Today's Date: 08/07/2024   History of Present Illness Pt is a 64 y.o. F who presented 08/04/24 with R side weakness, dysarthria and L eye blurriness. 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 PMHx: HTN, GERD, anxiety and depression, OSA, migraines with aura.    PT Comments  Today's session focused on the pt's ability to maintain walking balance while responding to different task demands, through various dynamic conditions. She completed the DGI and scored 13/24, which is below the cut-off for increased fall risk (<19/24). Pt required close supervision for gait and CGA for stairs. She utilized the rollator throughout functional mobility. Pt tended to speak with her hands and would frequently let go of AD. Instructed pt to maintain BUE support in contact with the rollator handles at all times. Pt will continue to benefit from acute skilled PT to increase her independence and safety with mobility to allow discharge home with OPPT.    If plan is discharge home, recommend the following: Help with stairs or ramp for entrance;Assist for transportation   Can travel by private vehicle        Equipment Recommendations  None recommended by PT    Recommendations for Other Services       Precautions / Restrictions Precautions Precautions: Fall Recall of Precautions/Restrictions: Intact Precaution/Restrictions Comments: BP Goal: gradually normalize Restrictions Weight Bearing Restrictions Per Provider Order: No     Mobility  Bed Mobility Overal bed mobility: Modified Independent             General bed mobility comments: HOB flat, no use of bed rails; increased time/effort.    Transfers Overall transfer level: Modified independent Equipment used: Rollator (4 wheels)               General transfer comment:  Pt performed sit<>stand and bed<>toilet transfer without physical assist or cues for sequencing.    Ambulation/Gait Ambulation/Gait assistance: Supervision Gait Distance (Feet): 200 Feet Assistive device: Rollator (4 wheels) Gait Pattern/deviations: Step-through pattern, Decreased stride length Gait velocity: decr Gait velocity interpretation: 1.31 - 2.62 ft/sec, indicative of limited community ambulator   General Gait Details: Pt ambulated with a reciprocal gait pattern, even weight shift, and fair foot clearence. She navigated room/hallway well. No LOB. Pt likes to talk with her hands, so she frequently lets go of rollator handles. Instructed pt to maintain BUE support for improved stability.   Stairs Stairs: Yes Stairs assistance: Contact guard assist Stair Management: Two rails, Forwards, Step to pattern, Sideways Number of Stairs: 5 (x2) General stair comments: Pt completed 2 standard height ~6 steps and 3 shorter ~4 steps. She utilized BUE support on handrails. Pt ascended one step at a time leading with LLE. She descended slightly sideways leading with RLE.   Wheelchair Mobility     Tilt Bed    Modified Rankin (Stroke Patients Only) Modified Rankin (Stroke Patients Only) Pre-Morbid Rankin Score: No symptoms Modified Rankin: Slight disability     Balance                                 Standardized Balance Assessment Standardized Balance Assessment : Dynamic Gait Index   Dynamic Gait Index Level Surface: Mild Impairment Change in Gait Speed: Moderate Impairment Gait with Horizontal Head Turns: Mild Impairment Gait with Vertical Head  Turns: Mild Impairment Gait and Pivot Turn: Mild Impairment Step Over Obstacle: Moderate Impairment Step Around Obstacles: Mild Impairment Steps: Moderate Impairment Total Score: 13      Communication Communication Communication: No apparent difficulties  Cognition Arousal: Alert Behavior During Therapy: WFL  for tasks assessed/performed   PT - Cognitive impairments: No apparent impairments                       PT - Cognition Comments: Pt A,Ox4 Following commands: Intact      Cueing Cueing Techniques: Verbal cues  Exercises      General Comments General comments (skin integrity, edema, etc.): VSS on RA. Partner present and supportive throughout session. Educated pt on signs/symptoms of stroke using BEFAST acrynonym.      Pertinent Vitals/Pain Pain Assessment Pain Assessment: No/denies pain    Home Living                          Prior Function            PT Goals (current goals can now be found in the care plan section) Acute Rehab PT Goals Patient Stated Goal: Return Home PT Goal Formulation: With patient Time For Goal Achievement: 08/19/24 Potential to Achieve Goals: Good Progress towards PT goals: Progressing toward goals    Frequency    Min 2X/week      PT Plan      Co-evaluation              AM-PAC PT 6 Clicks Mobility   Outcome Measure  Help needed turning from your back to your side while in a flat bed without using bedrails?: None Help needed moving from lying on your back to sitting on the side of a flat bed without using bedrails?: None Help needed moving to and from a bed to a chair (including a wheelchair)?: A Little Help needed standing up from a chair using your arms (e.g., wheelchair or bedside chair)?: A Little Help needed to walk in hospital room?: A Little Help needed climbing 3-5 steps with a railing? : A Little 6 Click Score: 20    End of Session Equipment Utilized During Treatment: Gait belt Activity Tolerance: Patient tolerated treatment well Patient left: with call bell/phone within reach;in bed;with family/visitor present Nurse Communication: Mobility status PT Visit Diagnosis: Other abnormalities of gait and mobility (R26.89);Muscle weakness (generalized) (M62.81)     Time: 8574-8554 PT Time  Calculation (min) (ACUTE ONLY): 20 min  Charges:    $Gait Training: 8-22 mins PT General Charges $$ ACUTE PT VISIT: 1 Visit                     Randall SAUNDERS, PT, DPT Acute Rehabilitation Services Office: [phone] Secure Chat Preferred  Cathy [name] 08/07/2024, 3:40 PM

## 2024-08-07 NOTE — Interval H&P Note (Signed)
 History and Physical Interval Note:  08/07/2024 11:37 AM  Cathy [name]  has presented today for surgery, with the diagnosis of CVA.  The various methods of treatment have been discussed with the patient and family. After consideration of risks, benefits and other options for treatment, the patient has consented to  Procedures: TRANSESOPHAGEAL ECHOCARDIOGRAM (N/A) as a surgical intervention.  The patient's history has been reviewed, patient examined, no change in status, stable for surgery.  I have reviewed the patient's chart and labs.  Questions were answered to the patient's satisfaction.     [name]

## 2024-08-07 NOTE — Progress Notes (Signed)
 PT Cancellation Note  Patient Details Name: Cathy [name] MRN: [id number] DOB: March 13, 1961   Cancelled Treatment:    Reason Eval/Treat Not Completed: Patient at procedure or test/unavailable (Pt off floor for TEE. Will follow-up for PT treatment as schedule permits.)  Randall SAUNDERS, PT, DPT Acute Rehabilitation Services Office: [phone] Secure Chat Preferred  [name] 08/07/2024, 11:39 AM

## 2024-08-07 NOTE — Transfer of Care (Signed)
 Immediate Anesthesia Transfer of Care Note  Patient: Cathy [name]  Procedure(s) Performed: TRANSESOPHAGEAL ECHOCARDIOGRAM  Patient Location: PACU  Anesthesia Type:MAC  Level of Consciousness: awake, alert , and oriented  Airway & Oxygen  Therapy: Patient Spontanous Breathing  Post-op Assessment: Report given to RN and Post -op Vital signs reviewed and stable  Post vital signs: Reviewed and stable  Last Vitals:  Vitals Value Taken Time  BP    Temp    Pulse    Resp    SpO2      Last Pain:  Vitals:   08/07/24 1129  TempSrc: Temporal  PainSc:          Complications: There were no known notable events for this encounter.

## 2024-08-07 NOTE — Op Note (Signed)
 INDICATIONS: Cryptogenic stroke  PROCEDURE:   Informed consent was obtained prior to the procedure. The risks, benefits and alternatives for the procedure were discussed and the patient comprehended these risks.  Risks include, but are not limited to, cough, sore throat, vomiting, nausea, somnolence, esophageal and stomach trauma or perforation, bleeding, low blood pressure, aspiration, pneumonia, infection, trauma to the teeth and death.    During this procedure the patient was administered IV propofol by Anesthesiology, Dr. Keneth.  The transesophageal probe was inserted in the esophagus and stomach without difficulty and multiple views were obtained.  The patient was kept under observation until the patient left the procedure room.  The patient left the procedure room in stable condition.   Agitated microbubble saline contrast was not administered.  COMPLICATIONS:    There were no immediate complications.  FINDINGS:  Normal TEE. No significant aortic atherosclerosis.   RECOMMENDATIONS:     No cardioembolic source.  Time Spent Directly with the Patient:  45 minutes   Cathy [name] 08/07/2024, 12:09 PM

## 2024-08-07 NOTE — Progress Notes (Signed)
 OT Cancellation Note  Patient Details Name: Cathy [name] MRN: [id number] DOB: [birth year]   Cancelled Treatment:    Reason Eval/Treat Not Completed: Patient at procedure or test/ unavailable (Patient off unit for TEE, OT to follow up later today as schedule permits)  Jeb LITTIE Laine 08/07/2024, 12:52 PM  Dick Laine, OTA Acute Rehabilitation Services  Office [phone]

## 2024-08-07 NOTE — Care Management Important Message (Signed)
 Important Message  Patient Details  Name: Cathy [name] MRN: [id number] Date of Birth: 10-14-1960   Important Message Given:  Yes - Medicare IM     Claretta Deed 08/07/2024, 3:21 PM

## 2024-08-07 NOTE — Plan of Care (Signed)
" °  Problem: Health Behavior/Discharge Planning: Goal: Ability to manage health-related needs will improve Outcome: Progressing   Problem: Clinical Measurements: Goal: Will remain free from infection Outcome: Progressing   Problem: Clinical Measurements: Goal: Respiratory complications will improve Outcome: Progressing   Problem: Clinical Measurements: Goal: Cardiovascular complication will be avoided Outcome: Progressing   Problem: Nutrition: Goal: Adequate nutrition will be maintained Outcome: Progressing   Problem: Pain Managment: Goal: General experience of comfort will improve and/or be controlled Outcome: Progressing   Problem: Safety: Goal: Ability to remain free from injury will improve Outcome: Progressing   Problem: Ischemic Stroke/TIA Tissue Perfusion: Goal: Complications of ischemic stroke/TIA will be minimized Outcome: Progressing   "

## 2024-08-07 NOTE — Progress Notes (Signed)
" °  Echocardiogram Echocardiogram Transesophageal has been performed.  Vonne Mcdanel 08/07/2024, 1:56 PM "

## 2024-08-08 ENCOUNTER — Encounter (HOSPITAL_COMMUNITY): Payer: Self-pay | Admitting: Cardiovascular Disease

## 2024-08-09 LAB — CULTURE, BLOOD (ROUTINE X 2)
Culture: NO GROWTH
Culture: NO GROWTH

## 2024-08-10 ENCOUNTER — Telehealth: Payer: Self-pay | Admitting: *Deleted

## 2024-08-10 ENCOUNTER — Telehealth: Payer: Self-pay | Admitting: Family Medicine

## 2024-08-10 NOTE — Telephone Encounter (Signed)
 Copied from CRM [id number] [id number]. Topic: Appointments - Scheduling Inquiry for Clinic >> Aug 10, 2024 11:15 AM Tobias CROME wrote:  Reason for CRM: Patient requesting hospital f/u with Dr. Newlin. Advised by hosptial to make appointment in one week, discharged on 08/07/2024. No appointments availble with Dr. Newlin within 14 days.   Requesting to be squeezed in for hospital f/u.   Best callback: [phone]

## 2024-08-10 NOTE — Transitions of Care (Post Inpatient/ED Visit) (Signed)
 "  08/10/2024  Name: Cathy [name] MRN: [id number] DOB: July 21, 1961  Today's TOC FU Call Status: Today's TOC FU Call Status:: Successful TOC FU Call Completed TOC FU Call Complete Date: 08/10/24  Patient's Name and Date of Birth confirmed. Name, DOB  Transition Care Management Follow-up Telephone Call Date of Discharge: 08/07/24 Discharge Facility: [facility]) Type of Discharge: Inpatient Admission Primary Inpatient Discharge Diagnosis:: acute ischemic stroke How have you been since you were released from the hospital?: Better Any questions or concerns?: No  Items Reviewed: Did you receive and understand the discharge instructions provided?: Yes Medications obtained,verified, and reconciled?: Yes (Medications Reviewed) Any new allergies since your discharge?: No Dietary orders reviewed?: Yes Type of Diet Ordered:: heart healthy Do you have support at home?: Yes People in Home [RPT]: spouse Name of Support/Comfort Primary Source: [name]  Medications Reviewed Today: Medications Reviewed Today     Reviewed by Kennieth Cathlean DEL, RN (Case Manager) on 08/10/24 at 1048  Med List Status: <None>   Medication Order Taking? Sig Documenting Provider Last Dose Status Informant  albuterol  (VENTOLIN  HFA) 108 (90 Base) MCG/ACT inhaler [id number] Yes Inhale 1-2 puffs into the lungs every 6 (six) hours as needed for wheezing or shortness of breath. Newlin, Enobong, MD  Active Self, Pharmacy Records  aspirin  EC 81 MG tablet [id number] Yes Take 1 tablet (81 mg total) by mouth daily. Swallow whole. Idelle Nakai, DO  Active   atorvastatin  (LIPITOR) 40 MG tablet [id number] Yes Take 1 tablet (40 mg total) by mouth daily. Idelle Nakai, DO  Active   cetirizine  (ZYRTEC ) 10 MG tablet [id number] Yes Take 1 tablet (10 mg total) by mouth daily. Newlin, Enobong, MD  Active Self, Pharmacy Records  chlorthalidone  (HYGROTON ) 25 MG tablet [id number]  Take 1 tablet (25 mg total) by mouth daily. Newlin, Enobong,  MD  Active Self, Pharmacy Records  clopidogrel  (PLAVIX ) 75 MG tablet [id number] Yes Take 1 tablet (75 mg total) by mouth daily. Idelle Nakai, DO  Active   diclofenac  Sodium (VOLTAREN ) 1 % GEL [id number] Yes Apply 4 g topically 4 (four) times daily. Newlin, Enobong, MD  Active Self, Pharmacy Records           Med Note HAROLDINE, [name]   Tue Aug 04, 2024 12:41 PM) Needs refill  fluticasone  (FLONASE ) 50 MCG/ACT nasal spray [id number] Yes Place 2 sprays into both nostrils daily. Brien Belvie BRAVO, MD  Active Self, Pharmacy Records  fluticasone  (FLOVENT  [facility]) 44 MCG/ACT inhaler [id number]  Inhale 2 puffs into the lungs 2 (two) times daily.  Patient not taking: Reported on 08/10/2024   Newlin, Enobong, MD  Active Self, Pharmacy Records           Med Note Norwood, [name]   Tue Aug 04, 2024 12:49 PM) No record of recent fill. Patient attests to having supply  lidocaine  (LIDODERM ) 5 % [id number] Yes Place 1 patch onto the skin daily. Remove & Discard patch within 12 hours or as directed by MD Delbert Clam, MD  Active Self, Pharmacy Records  Misc. Devices MISC [id number] Yes CPAP, AutoPap 5 - 20 cm water.  Diagnosis-obstructive sleep apnea Delbert Clam, MD  Active Self, Pharmacy Records  nitroGLYCERIN  (NITROSTAT ) 0.4 MG SL tablet [id number] Yes Place 1 tablet (0.4 mg total) under the tongue every 5 (five) minutes as needed for chest pain. Newlin, Enobong, MD  Active Self, Pharmacy Records  tiZANidine  (ZANAFLEX ) 4 MG tablet [id number] Yes Take 1 tablet (4 mg total) by mouth 3 (three) times  daily as needed for muscle spasms. Delbert Clam, MD  Active Self, Pharmacy Records            Home Care and Equipment/Supplies: Were Home Health Services Ordered?: NA Any new equipment or medical supplies ordered?: Yes Name of Medical supply agency?: [employer] Were you able to get the equipment/medical supplies?: Yes Do you have any questions related to the use of the equipment/supplies?: No  Functional  Questionnaire: Do you need assistance with bathing/showering or dressing?: No Do you need assistance with meal preparation?: No Do you need assistance with eating?: No Do you have difficulty maintaining continence: No Do you need assistance with getting out of bed/getting out of a chair/moving?: Yes (uses a walker and cane) Do you have difficulty managing or taking your medications?: No  Follow up appointments reviewed: PCP Follow-up appointment confirmed?: No MD Provider Line [phone] Given: Yes (patient is going to call for appt) Specialist Hospital Follow-up appointment confirmed?: Yes Date of Specialist follow-up appointment?: 09/24/24 Follow-Up Specialty Provider:: Emeline Calender Do you need transportation to your follow-up appointment?: No Do you understand care options if your condition(s) worsen?: Yes-patient verbalized understanding  SDOH Interventions Today    Flowsheet Row Most Recent Value  SDOH Interventions   Food Insecurity Interventions Intervention Not Indicated  Housing Interventions Intervention Not Indicated  Transportation Interventions Intervention Not Indicated, Patient Resources (Friends/Family)  Utilities Interventions Intervention Not Indicated  Depression Interventions/Treatment  Patient refuses Treatment    Goals Addressed             This Visit's Progress    VBCI Transitions of Care (TOC) Care Plan       Problems:  Recent Hospitalization for treatment of Acute ischemic stroke Knowledge Deficit Related to Acute ischemic stroke and Medication management barrier Patient needed medication list reviewed due to left medications off that she was suppose to be taking.   Goal:  Over the next 30 days, the patient will not experience hospital readmission  Interventions:   Stroke: Reviewed Importance of taking all medications as prescribed Reviewed Importance of attending all scheduled provider appointments Advised to report any changes in  symptoms or exercise tolerance Assessed for signs and symptoms of stroke Reviewed referrals to outpatient therapy Reviewed the importance of exercise Assessed for cognitive impairment Assessed for fall status and safety in the home  Patient Self Care Activities:  Attend all scheduled provider appointments Call pharmacy for medication refills 3-7 days in advance of running out of medications Call provider office for new concerns or questions  Notify RN Care Manager of TOC call rescheduling needs Participate in Transition of Care Program/Attend TOC scheduled calls Take medications as prescribed   Monitor Blood pressure daily and document Monitor diet for sodium intake Preventive Fall precautions   Plan:  An initial telephone outreach has been scheduled for: [phone] Next PCP appointment scheduled for: Patient will call for an appointment Telephone follow up appointment with care management team member scheduled for:  Andrea Dimes [phone] 1:00       Discussed and offered 30 day TOC program.  Patient  accepted.  The patient has been provided with contact information for the care management team and has been advised to call with any health -related questions or concerns.  The patient verbalized understanding with current plan of care.  The patient is directed to their insurance card regarding availability of benefits coverage   Patient will follow up on getting a new CPAP. Current one over 52 years old.   Cathlean Ilisha Blust  BSN RN [facility] Health Care Management Coordinator [email or web address] Direct Dial: [phone]  Fax: [phone] Website: [email or web address]  "

## 2024-08-10 NOTE — Transitions of Care (Post Inpatient/ED Visit) (Signed)
" ° °  08/10/2024  Name: ULANI DEGRASSE MRN: [id number] DOB: 03/24/1961  Today's TOC FU Call Status: Today's TOC FU Call Status:: Unsuccessful Call (1st Attempt) Unsuccessful Call (1st Attempt) Date: 08/10/24  Attempted to reach the patient regarding the most recent Inpatient/ED visit.  Follow Up Plan: Additional outreach attempts will be made to reach the patient to complete the Transitions of Care (Post Inpatient/ED visit) call.   Cathlean Headland BSN RN [facility] Health Care Management Coordinator [email or web address] Direct Dial: [phone]  Fax: [phone] Website: [email or web address]  "

## 2024-08-10 NOTE — Telephone Encounter (Signed)
 Contacted patient, Appointment scheduled.

## 2024-08-18 ENCOUNTER — Other Ambulatory Visit: Payer: Self-pay | Admitting: *Deleted

## 2024-08-18 ENCOUNTER — Telehealth: Payer: Self-pay | Admitting: Home Health

## 2024-08-18 NOTE — Patient Instructions (Signed)
 Visit Information  Thank you for taking time to visit with me today. Please don't hesitate to contact me if I can be of assistance to you before our next scheduled telephone appointment.   Following is a copy of your care plan:   Goals Addressed             This Visit's Progress    VBCI Transitions of Care (TOC) Care Plan       Problems:  Recent Hospitalization for treatment of Acute ischemic stroke Knowledge Deficit Related to Acute ischemic stroke and Medication management barrier Patient needed medication list reviewed due to left medications off that she was suppose to be taking.   Goal:  Over the next 30 days, the patient will not experience hospital readmission  Interventions:   Stroke: Reviewed Importance of taking all medications as prescribed Reviewed Importance of attending all scheduled provider appointments Advised to report any changes in symptoms or exercise tolerance Screening for signs and symptoms of depression related to chronic disease state Assessed for signs and symptoms of stroke Reviewed referrals to outpatient therapy Reviewed the importance of exercise Assessed for cognitive impairment Fall precautions reviewed Advised patient to discuss sleep study referral with PCP on 08/31/24 Reviewed BP  Patient Self Care Activities:  Attend all scheduled provider appointments Call pharmacy for medication refills 3-7 days in advance of running out of medications Call provider office for new concerns or questions  Notify RN Care Manager of TOC call rescheduling needs Participate in Transition of Care Program/Attend TOC scheduled calls Take medications as prescribed   Monitor Blood pressure daily and document Monitor diet for sodium intake Preventive Fall precautions   Plan:  Telephone follow up appointment with care management team member scheduled for:  Andrea Dimes 08/25/24 at 1pm        Patient verbalizes understanding of instructions and care plan  provided today and agrees to view in MyChart. Active MyChart status and patient understanding of how to access instructions and care plan via MyChart confirmed with patient.     Telephone follow up appointment with care management team member scheduled for:08/25/24 at 2pm  Please call the care guide team at [phone] if you need to cancel or reschedule your appointment.   Please call 1-800-273-TALK (toll free, 24 hour hotline) call 911 if you are experiencing a Mental Health or Behavioral Health Crisis or need someone to talk to.  Andrea Dimes RN, BSN [place]  [facility] RN Care Manager [phone]
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
## 2024-08-18 NOTE — Telephone Encounter (Signed)
 Called by [place] after hour, reporting that patient reported fainting today at 630pm, no rhythm data recorded. Called the patient, she answered the phone, states she was trying set up her monitor, no fainting occurred.
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
## 2024-08-19 ENCOUNTER — Encounter: Payer: Self-pay | Admitting: Occupational Therapy

## 2024-08-19 ENCOUNTER — Ambulatory Visit: Admitting: Physical Therapy

## 2024-08-19 ENCOUNTER — Ambulatory Visit: Attending: Infectious Diseases | Admitting: Occupational Therapy

## 2024-08-19 ENCOUNTER — Other Ambulatory Visit: Payer: Self-pay

## 2024-08-19 ENCOUNTER — Encounter: Payer: Self-pay | Admitting: Physical Therapy

## 2024-08-19 VITALS — BP 131/68 | HR 68

## 2024-08-19 DIAGNOSIS — I69351 Hemiplegia and hemiparesis following cerebral infarction affecting right dominant side: Secondary | ICD-10-CM | POA: Insufficient documentation

## 2024-08-19 DIAGNOSIS — R29898 Other symptoms and signs involving the musculoskeletal system: Secondary | ICD-10-CM

## 2024-08-19 DIAGNOSIS — R41842 Visuospatial deficit: Secondary | ICD-10-CM | POA: Insufficient documentation

## 2024-08-19 DIAGNOSIS — R278 Other lack of coordination: Secondary | ICD-10-CM

## 2024-08-19 DIAGNOSIS — M6281 Muscle weakness (generalized): Secondary | ICD-10-CM | POA: Insufficient documentation

## 2024-08-19 DIAGNOSIS — R29818 Other symptoms and signs involving the nervous system: Secondary | ICD-10-CM | POA: Insufficient documentation

## 2024-08-19 DIAGNOSIS — I639 Cerebral infarction, unspecified: Secondary | ICD-10-CM | POA: Insufficient documentation

## 2024-08-19 DIAGNOSIS — Z9181 History of falling: Secondary | ICD-10-CM

## 2024-08-19 DIAGNOSIS — R2689 Other abnormalities of gait and mobility: Secondary | ICD-10-CM
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
## 2024-08-24 ENCOUNTER — Telehealth: Payer: Self-pay | Admitting: Family Medicine

## 2024-08-24 NOTE — Telephone Encounter (Signed)
 Plavix  was meant to be taken for only 21 days and after that she should continue on just aspirin  per discharge notes.

## 2024-08-24 NOTE — Telephone Encounter (Signed)
 Copied from CRM [id number]. Topic: Clinical - Medication Refill >> Aug 24, 2024  9:50 AM   Harlene ORN wrote:  Medication: clopidogrel  (PLAVIX ) 75 MG tablet  Has the patient contacted their pharmacy? No (Agent: If no, request that the patient contact the pharmacy for the refill. If patient does not wish to contact the pharmacy document the reason why and proceed with request.) (Agent: If yes, when and what did the pharmacy advise?)  This is the patient's preferred pharmacy:  [id number] - [place] ([place] - [address] [place] ([place]) [place] [address] Phone: [phone] Fax: [phone]  Is this the correct pharmacy for this prescription? Yes If no, delete pharmacy and type the correct one.   Has the prescription been filled recently? Yes  Is the patient out of the medication? No  Has the patient been seen for an appointment in the last year OR does the patient have an upcoming appointment? Yes  Can we respond through MyChart? Yes  Agent: Please be advised that Rx refills may take up to 3 business days. We ask that you follow-up with your pharmacy.

## 2024-08-24 NOTE — Telephone Encounter (Signed)
 Routing to PCP for review.

## 2024-08-24 NOTE — Telephone Encounter (Signed)
 Copied from CRM [id number]. Topic: Clinical - Medical Advice >> Aug 24, 2024  9:54 AM   Harlene ORN wrote:  Reason for CRM: Patient stated she was supposed to be set up for another sleep study, but has not heard anything about it since. Please call  back to discuss with the patient.

## 2024-08-24 NOTE — Telephone Encounter (Signed)
 Patient called and informed that she has a upcoming appointment on 08/31/24 and will discuss concerns for sleep study.

## 2024-08-25 ENCOUNTER — Other Ambulatory Visit: Payer: Self-pay | Admitting: *Deleted
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
## 2024-08-25 NOTE — Patient Instructions (Signed)
 Visit Information  Thank you for taking time to visit with me today. Please don't hesitate to contact me if I can be of assistance to you before our next scheduled telephone appointment.   Following is a copy of your care plan:   Goals Addressed             This Visit's Progress    [facility] Transitions of Care (TOC) Care Plan       Problems:  Recent Hospitalization for treatment of Acute ischemic stroke Knowledge Deficit Related to Acute ischemic stroke and Medication management barrier Patient needed medication list reviewed due to left medications off that she was suppose to be taking.   Goal:  Over the next 30 days, the patient will not experience hospital readmission  Interventions:   Stroke: Reviewed Importance of taking all medications as prescribed Reviewed Importance of attending all scheduled provider appointments Advised to report any changes in symptoms or exercise tolerance Screening for signs and symptoms of depression related to chronic disease state Reviewed the importance of exercise Fall precautions reviewed Advised patient to discuss sleep study referral with PCP on 08/31/24 Reviewed BP Discussed patient is to continue taking Aspirin  daily, Plavix  is only a 21 day prescription  Patient Self Care Activities:  Attend all scheduled provider appointments Call pharmacy for medication refills 3-7 days in advance of running out of medications Call provider office for new concerns or questions  Notify RN Care Manager of TOC call rescheduling needs Participate in Transition of Care Program/Attend TOC scheduled calls Take medications as prescribed   Monitor Blood pressure daily and document Monitor diet for sodium intake Preventive Fall precautions   Plan:  Telephone follow up appointment with care management team member scheduled for:  09/01/24 at 3:15pm        Patient verbalizes understanding of instructions and care plan provided today and agrees to view in  MyChart. Active MyChart status and patient understanding of how to access instructions and care plan via MyChart confirmed with patient.     Telephone follow up appointment with care management team member scheduled for:09/01/24 at 3:15pm  Please call the care guide team at [phone] if you need to cancel or reschedule your appointment.   Please call 1-800-273-TALK (toll free, 24 hour hotline) call 911 if you are experiencing a Mental Health or Behavioral Health Crisis or need someone to talk to.  Andrea Dimes RN, BSN [place]  [facility] RN Care Manager [phone]

## 2024-08-25 NOTE — Telephone Encounter (Signed)
 Patient has been called and informed.

## 2024-08-28 ENCOUNTER — Ambulatory Visit: Admitting: Occupational Therapy

## 2024-08-28 ENCOUNTER — Telehealth: Payer: Self-pay | Admitting: Family Medicine

## 2024-08-28 ENCOUNTER — Other Ambulatory Visit: Payer: Self-pay | Admitting: Family Medicine

## 2024-08-28 ENCOUNTER — Ambulatory Visit: Admitting: Physical Therapy

## 2024-08-28 VITALS — BP 130/69 | HR 63

## 2024-08-28 DIAGNOSIS — R29898 Other symptoms and signs involving the musculoskeletal system: Secondary | ICD-10-CM

## 2024-08-28 DIAGNOSIS — R29818 Other symptoms and signs involving the nervous system: Secondary | ICD-10-CM

## 2024-08-28 DIAGNOSIS — Z9181 History of falling: Secondary | ICD-10-CM

## 2024-08-28 DIAGNOSIS — I69351 Hemiplegia and hemiparesis following cerebral infarction affecting right dominant side: Secondary | ICD-10-CM

## 2024-08-28 DIAGNOSIS — R278 Other lack of coordination: Secondary | ICD-10-CM

## 2024-08-28 DIAGNOSIS — M6281 Muscle weakness (generalized): Secondary | ICD-10-CM

## 2024-08-28 DIAGNOSIS — R2689 Other abnormalities of gait and mobility: Secondary | ICD-10-CM

## 2024-08-28 NOTE — Telephone Encounter (Signed)
 Contacted pt to resch appt due to the weather pt also request refill atorvastatin  and clopidgrel until her appt
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
## 2024-08-28 NOTE — Patient Instructions (Signed)
 Cathy [name]
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
## 2024-08-28 NOTE — Telephone Encounter (Unsigned)
 Copied from CRM [id number]. Topic: Clinical - Medication Refill >> Aug 28, 2024  5:26 PM Santiya F wrote: Medication: atorvastatin  (LIPITOR) 40 MG tablet [[id number]]  Has the patient contacted their pharmacy? Yes  (Agent: If yes, when and what did the pharmacy advise?) contact office   This is the patient's preferred pharmacy:  [id number] - [place] ([place]), [place] - [address] [place] ([place]) [place] [address] Phone: [phone] Fax: [phone]  Is this the correct pharmacy for this prescription? Yes If no, delete pharmacy and type the correct one.   Has the prescription been filled recently? Yes  Is the patient out of the medication? No  Has the patient been seen for an appointment in the last year OR does the patient have an upcoming appointment? Yes  Can we respond through MyChart? Yes  Agent: Please be advised that Rx refills may take up to 3 business days. We ask that you follow-up with your pharmacy.

## 2024-08-28 NOTE — Telephone Encounter (Signed)
 Patient has been called and informed that she is not to take plavix  anymore. I also informed patient that she has refill on her medicationos on file. She then states that she has a little pink pill that needs to be refilled. I asked the name of the medication and she did not know it, she states that she is not at home. I informed her again that she has refills on all her medication. Patient then states that I will just take all my medication until they run out and hung up the phone.

## 2024-08-31 ENCOUNTER — Ambulatory Visit: Payer: Self-pay | Admitting: Family Medicine

## 2024-08-31 MED ORDER — ATORVASTATIN CALCIUM 40 MG PO TABS: 40.0000 mg | ORAL_TABLET | Freq: Every day | ORAL | 1 refills | Status: AC

## 2024-08-31 NOTE — Telephone Encounter (Signed)
 Requested Prescriptions  Pending Prescriptions Disp Refills   atorvastatin  (LIPITOR) 40 MG tablet 30 tablet 0    Sig: Take 1 tablet (40 mg total) by mouth daily.     Cardiovascular:  Antilipid - Statins Failed - 08/31/2024  7:57 AM      Failed - Lipid Panel in normal range within the last 12 months    Cholesterol, Total  Date Value Ref Range Status  04/14/2024 151 100 - 199 mg/dL Final   Cholesterol  Date Value Ref Range Status  08/04/2024 149 0 - 200 mg/dL Final    Comment:           ATP III CLASSIFICATION:  <200     mg/dL   Desirable  799-760  mg/dL   Borderline High  >=759    mg/dL   High           LDL Chol Calc (NIH)  Date Value Ref Range Status  04/14/2024 71 0 - 99 mg/dL Final   LDL Cholesterol  Date Value Ref Range Status  08/04/2024 73 0 - 99 mg/dL Final    Comment:           Total Cholesterol/HDL:CHD Risk Coronary Heart Disease Risk Table                     Men   Women  1/2 Average Risk   3.4   3.3  Average Risk       5.0   4.4  2 X Average Risk   9.6   7.1  3 X Average Risk  23.4   11.0        Use the calculated Patient Ratio above and the CHD Risk Table to determine the patient's CHD Risk.        ATP III CLASSIFICATION (LDL):  <100     mg/dL   Optimal  899-870  mg/dL   Near or Above                    Optimal  130-159  mg/dL   Borderline  839-810  mg/dL   High  >809     mg/dL   Very High Performed at [facility], [address] [place], [place] [address]    HDL  Date Value Ref Range Status  08/04/2024 63 >40 mg/dL Final  90/90/7974 68 >60 mg/dL Final   Triglycerides  Date Value Ref Range Status  08/04/2024 62 <150 mg/dL Final         Passed - Patient is not pregnant      Passed - Valid encounter within last 12 months    Recent Outpatient Visits           1 month ago Essential hypertension   [facility] [place]. [facility] Delbert Clam, MD   4 months ago Muscle spasm   [facility] [place] Delbert Clam, MD   10 months ago Fear of bridges   [facility] Of [place]. [facility] Delbert Clam, MD   12 months ago Weakness   [facility] [place]. [facility] Delbert Clam, MD   1 year ago Sacroiliac inflammation   [facility] [facility] [place] [facility] [place]. [facility] Delbert Clam, MD  Future Appointments             In 3 weeks Segal, Jared E, DO [facility] A Dept of [employer]. [facility] [employer], [employer]

## 2024-09-01 ENCOUNTER — Other Ambulatory Visit: Payer: Self-pay | Admitting: *Deleted
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
## 2024-09-01 NOTE — Patient Instructions (Signed)
 Visit Information  Thank you for taking time to visit with me today. Please don't hesitate to contact me if I can be of assistance to you before our next scheduled telephone appointment.   Following is a copy of your care plan:   Goals Addressed             This Visit's Progress    [facility] Transitions of Care (TOC) Care Plan       Problems:  Recent Hospitalization for treatment of Acute ischemic stroke Knowledge Deficit Related to Acute ischemic stroke and Medication management barrier Patient needed medication list reviewed due to left medications off that she was suppose to be taking.   Goal:  Over the next 30 days, the patient will not experience hospital readmission  Interventions:   Stroke: Reviewed Importance of taking all medications as prescribed Reviewed Importance of attending all scheduled provider appointments Advised to report any changes in symptoms or exercise tolerance Reviewed the importance of exercise Advised patient to discuss sleep study referral with PCP on 09/22/24 Discussed patient is to continue taking Aspirin  daily  Patient Self Care Activities:  Attend all scheduled provider appointments Call pharmacy for medication refills 3-7 days in advance of running out of medications Call provider office for new concerns or questions  Notify RN Care Manager of [facility] call rescheduling needs Participate in Transition of Care Program/Attend TOC scheduled calls Take medications as prescribed   Monitor Blood pressure daily and document Monitor diet for sodium intake Preventive Fall precautions   Plan:  Telephone follow up appointment with care management team member scheduled for:  09/10/24 at 11:15am        Patient verbalizes understanding of instructions and care plan provided today and agrees to view in MyChart. Active MyChart status and patient understanding of how to access instructions and care plan via MyChart confirmed with patient.     Telephone follow  up appointment with care management team member scheduled for:09/10/24 at 11:15am  Please call the care guide team at [phone] if you need to cancel or reschedule your appointment.   Please call [phone]-TALK (toll free, 24 hour hotline) call 911 if you are experiencing a Mental Health or Behavioral Health Crisis or need someone to talk to.  Andrea Dimes RN, BSN [place]  [facility] RN Care Manager [phone]

## 2024-09-02 ENCOUNTER — Ambulatory Visit: Admitting: Physical Therapy

## 2024-09-02 ENCOUNTER — Encounter: Payer: Self-pay | Admitting: Physical Therapy

## 2024-09-02 ENCOUNTER — Ambulatory Visit: Admitting: Occupational Therapy

## 2024-09-02 VITALS — BP 129/62 | HR 67

## 2024-09-02 DIAGNOSIS — R2689 Other abnormalities of gait and mobility: Secondary | ICD-10-CM

## 2024-09-02 DIAGNOSIS — R278 Other lack of coordination: Secondary | ICD-10-CM

## 2024-09-02 DIAGNOSIS — I69351 Hemiplegia and hemiparesis following cerebral infarction affecting right dominant side: Secondary | ICD-10-CM

## 2024-09-02 DIAGNOSIS — Z9181 History of falling: Secondary | ICD-10-CM

## 2024-09-02 DIAGNOSIS — M6281 Muscle weakness (generalized): Secondary | ICD-10-CM

## 2024-09-02 DIAGNOSIS — R29818 Other symptoms and signs involving the nervous system: Secondary | ICD-10-CM

## 2024-09-02 NOTE — Patient Instructions (Addendum)
 SABRA
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
## 2024-09-04 ENCOUNTER — Encounter: Payer: Self-pay | Admitting: Physical Therapy

## 2024-09-04 ENCOUNTER — Ambulatory Visit

## 2024-09-04 ENCOUNTER — Ambulatory Visit: Admitting: Physical Therapy

## 2024-09-04 VITALS — BP 141/76 | HR 65

## 2024-09-04 DIAGNOSIS — R278 Other lack of coordination: Secondary | ICD-10-CM

## 2024-09-04 DIAGNOSIS — I69351 Hemiplegia and hemiparesis following cerebral infarction affecting right dominant side: Secondary | ICD-10-CM

## 2024-09-04 DIAGNOSIS — M6281 Muscle weakness (generalized): Secondary | ICD-10-CM

## 2024-09-04 DIAGNOSIS — R2689 Other abnormalities of gait and mobility: Secondary | ICD-10-CM

## 2024-09-04 DIAGNOSIS — Z9181 History of falling: Secondary | ICD-10-CM
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
## 2024-09-04 NOTE — Patient Instructions (Addendum)
 Vision HEP:  Perform at least 3 times per day. Stop if your eye becomes fatigued or hurts and try again later.  1. Hold a small object/card in front of you.  Hold it in the middle at arm's length away.    2. Slowly move the object side to side in front of you while continuing to watch it with your eyes.   3.  Remember to keep your head still and only move your eyes.  4.  Repeat 5-10 times.  5.  Then, move object up and down while watching it 5-10 times.

## 2024-09-08 ENCOUNTER — Ambulatory Visit

## 2024-09-08 ENCOUNTER — Ambulatory Visit: Admitting: Physical Therapy

## 2024-09-08 VITALS — BP 132/98 | HR 59

## 2024-09-08 DIAGNOSIS — R29898 Other symptoms and signs involving the musculoskeletal system: Secondary | ICD-10-CM

## 2024-09-08 DIAGNOSIS — M6281 Muscle weakness (generalized): Secondary | ICD-10-CM

## 2024-09-08 DIAGNOSIS — I69351 Hemiplegia and hemiparesis following cerebral infarction affecting right dominant side: Secondary | ICD-10-CM

## 2024-09-08 DIAGNOSIS — R29818 Other symptoms and signs involving the nervous system: Secondary | ICD-10-CM

## 2024-09-08 DIAGNOSIS — R2689 Other abnormalities of gait and mobility: Secondary | ICD-10-CM

## 2024-09-08 DIAGNOSIS — R278 Other lack of coordination: Secondary | ICD-10-CM

## 2024-09-08 DIAGNOSIS — R41842 Visuospatial deficit: Secondary | ICD-10-CM

## 2024-09-08 DIAGNOSIS — Z9181 History of falling: Secondary | ICD-10-CM

## 2024-09-08 NOTE — Patient Instructions (Signed)
 Cathy [name]

## 2024-09-10 ENCOUNTER — Other Ambulatory Visit: Payer: Self-pay | Admitting: *Deleted
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
## 2024-09-10 NOTE — Patient Instructions (Signed)
 Visit Information  Thank you for taking time to visit with me today. Please don't hesitate to contact me if I can be of assistance to you before our next scheduled telephone appointment.   Following is a copy of your care plan:   Goals Addressed             This Visit's Progress    COMPLETED: [facility] Transitions of Care (TOC) Care Plan       Problems:  Recent Hospitalization for treatment of Acute ischemic stroke Knowledge Deficit Related to Acute ischemic stroke and Medication management barrier Patient needed medication list reviewed due to left medications off that she was suppose to be taking.   Goal:  Over the next 30 days, the patient will not experience hospital readmission  Interventions:   Stroke: Reviewed Importance of taking all medications as prescribed Reviewed Importance of attending all scheduled provider appointments Advised to report any changes in symptoms or exercise tolerance Reviewed the importance of exercise Advised patient to discuss sleep study referral with PCP on 09/22/24 Reviewed Medications  Referral to Longitudinal Case Management  Patient Self Care Activities:  Attend all scheduled provider appointments Call pharmacy for medication refills 3-7 days in advance of running out of medications Call provider office for new concerns or questions  Notify RN Care Manager of TOC call rescheduling needs Participate in Transition of Care Program/Attend TOC scheduled calls Take medications as prescribed   Monitor Blood pressure daily and document Monitor diet for sodium intake Preventive Fall precautions   Plan:  Care team will call within 30 days to schedule with CCM        Patient verbalizes understanding of instructions and care plan provided today and agrees to view in MyChart. Active MyChart status and patient understanding of how to access instructions and care plan via MyChart confirmed with patient.     The care management team will reach out  to the patient again over the next 30 days.   Please call the care guide team at [phone] if you need to cancel or reschedule your appointment.   Please call [phone]-TALK (toll free, 24 hour hotline) call 911 if you are experiencing a Mental Health or Behavioral Health Crisis or need someone to talk to.  Andrea Dimes RN, BSN [place]  [facility] RN Care Manager [phone]

## 2024-09-11 ENCOUNTER — Other Ambulatory Visit: Payer: Self-pay

## 2024-09-11 ENCOUNTER — Ambulatory Visit: Admitting: Physical Therapy

## 2024-09-11 ENCOUNTER — Ambulatory Visit

## 2024-09-11 ENCOUNTER — Other Ambulatory Visit: Payer: Self-pay | Admitting: Family Medicine

## 2024-09-11 VITALS — BP 124/82 | HR 69

## 2024-09-11 DIAGNOSIS — R2689 Other abnormalities of gait and mobility: Secondary | ICD-10-CM

## 2024-09-11 DIAGNOSIS — R29898 Other symptoms and signs involving the musculoskeletal system: Secondary | ICD-10-CM

## 2024-09-11 DIAGNOSIS — R1310 Dysphagia, unspecified: Secondary | ICD-10-CM

## 2024-09-11 DIAGNOSIS — R29818 Other symptoms and signs involving the nervous system: Secondary | ICD-10-CM

## 2024-09-11 DIAGNOSIS — I69351 Hemiplegia and hemiparesis following cerebral infarction affecting right dominant side: Secondary | ICD-10-CM

## 2024-09-11 DIAGNOSIS — R41842 Visuospatial deficit: Secondary | ICD-10-CM

## 2024-09-11 DIAGNOSIS — R278 Other lack of coordination: Secondary | ICD-10-CM

## 2024-09-11 DIAGNOSIS — Z9181 History of falling: Secondary | ICD-10-CM

## 2024-09-11 DIAGNOSIS — M6281 Muscle weakness (generalized): Secondary | ICD-10-CM
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
## 2024-09-16 ENCOUNTER — Ambulatory Visit: Admitting: Occupational Therapy

## 2024-09-16 ENCOUNTER — Ambulatory Visit: Admitting: Physical Therapy

## 2024-09-16 ENCOUNTER — Ambulatory Visit: Admitting: Speech Pathology

## 2024-09-18 ENCOUNTER — Ambulatory Visit: Admitting: Physical Therapy

## 2024-09-18 ENCOUNTER — Ambulatory Visit

## 2024-09-21 ENCOUNTER — Ambulatory Visit

## 2024-09-21 ENCOUNTER — Ambulatory Visit: Admitting: Physical Therapy

## 2024-09-22 ENCOUNTER — Ambulatory Visit: Admitting: Family Medicine

## 2024-09-23 ENCOUNTER — Ambulatory Visit: Admitting: Occupational Therapy

## 2024-09-23 ENCOUNTER — Ambulatory Visit: Admitting: Speech Pathology

## 2024-09-23 ENCOUNTER — Ambulatory Visit: Admitting: Physical Therapy

## 2024-09-24 ENCOUNTER — Ambulatory Visit: Admitting: Internal Medicine

## 2024-09-28 ENCOUNTER — Ambulatory Visit

## 2024-09-28 ENCOUNTER — Ambulatory Visit: Admitting: Physical Therapy

## 2024-09-30 ENCOUNTER — Ambulatory Visit: Admitting: Physical Therapy

## 2024-09-30 ENCOUNTER — Ambulatory Visit

## 2024-09-30 ENCOUNTER — Ambulatory Visit: Admitting: Occupational Therapy

## 2024-10-07 ENCOUNTER — Ambulatory Visit

## 2024-10-07 ENCOUNTER — Ambulatory Visit: Admitting: Occupational Therapy

## 2024-10-07 ENCOUNTER — Ambulatory Visit: Admitting: Physical Therapy

## 2024-10-09 ENCOUNTER — Ambulatory Visit: Admitting: Occupational Therapy

## 2024-10-09 ENCOUNTER — Ambulatory Visit

## 2024-10-09 ENCOUNTER — Ambulatory Visit: Admitting: Physical Therapy

## 2024-10-13 ENCOUNTER — Ambulatory Visit: Admitting: Physical Therapy

## 2024-10-13 ENCOUNTER — Ambulatory Visit: Admitting: Occupational Therapy

## 2024-10-13 ENCOUNTER — Ambulatory Visit

## 2024-10-16 ENCOUNTER — Ambulatory Visit: Admitting: Occupational Therapy

## 2024-10-16 ENCOUNTER — Ambulatory Visit

## 2024-10-16 ENCOUNTER — Ambulatory Visit: Admitting: Physical Therapy

## 2024-10-20 ENCOUNTER — Ambulatory Visit

## 2024-10-27 ENCOUNTER — Ambulatory Visit

## 2025-01-11 ENCOUNTER — Ambulatory Visit: Admitting: Family Medicine

## 2025-02-23 ENCOUNTER — Ambulatory Visit
